# Patient Record
Sex: Female | Born: 1965 | Race: White | Hispanic: No | Marital: Married | Smoking: Never smoker
Health system: Southern US, Community
[De-identification: ages and names within clinical notes are randomized; demographics above are authoritative.]

## PROBLEM LIST (undated history)

## (undated) DIAGNOSIS — E119 Type 2 diabetes mellitus without complications: Secondary | ICD-10-CM

## (undated) HISTORY — DX: Type 2 diabetes mellitus without complications: E11.9

## (undated) HISTORY — PX: APPENDECTOMY: SHX54

---

## 1997-05-07 HISTORY — PX: ABDOMINAL HYSTERECTOMY: SHX81

## 1997-05-07 HISTORY — PX: TOTAL ABDOMINAL HYSTERECTOMY: SHX209

## 2003-05-08 HISTORY — PX: CHOLECYSTECTOMY: SHX55

## 2008-05-07 HISTORY — PX: BILATERAL SALPINGOOPHORECTOMY: SHX1223

## 2008-08-05 LAB — HM COLONOSCOPY: HM COLON: NORMAL

## 2010-04-07 ENCOUNTER — Ambulatory Visit: Payer: Self-pay | Admitting: Internal Medicine

## 2011-03-14 ENCOUNTER — Ambulatory Visit: Payer: Self-pay | Admitting: Internal Medicine

## 2011-10-06 LAB — HM MAMMOGRAPHY: HM Mammogram: NORMAL

## 2012-04-02 DIAGNOSIS — R109 Unspecified abdominal pain: Secondary | ICD-10-CM | POA: Insufficient documentation

## 2014-05-07 HISTORY — PX: COLONOSCOPY: SHX174

## 2014-11-17 ENCOUNTER — Other Ambulatory Visit: Payer: Self-pay

## 2014-11-17 ENCOUNTER — Encounter: Payer: Self-pay | Admitting: Internal Medicine

## 2014-11-17 DIAGNOSIS — G8929 Other chronic pain: Secondary | ICD-10-CM | POA: Insufficient documentation

## 2014-11-17 DIAGNOSIS — R109 Unspecified abdominal pain: Secondary | ICD-10-CM

## 2014-11-17 DIAGNOSIS — N951 Menopausal and female climacteric states: Secondary | ICD-10-CM | POA: Insufficient documentation

## 2014-11-17 DIAGNOSIS — K224 Dyskinesia of esophagus: Secondary | ICD-10-CM | POA: Insufficient documentation

## 2014-11-17 DIAGNOSIS — F411 Generalized anxiety disorder: Secondary | ICD-10-CM | POA: Insufficient documentation

## 2014-11-17 DIAGNOSIS — G43009 Migraine without aura, not intractable, without status migrainosus: Secondary | ICD-10-CM | POA: Insufficient documentation

## 2014-11-17 DIAGNOSIS — F3341 Major depressive disorder, recurrent, in partial remission: Secondary | ICD-10-CM | POA: Insufficient documentation

## 2014-11-17 DIAGNOSIS — A0472 Enterocolitis due to Clostridium difficile, not specified as recurrent: Secondary | ICD-10-CM | POA: Insufficient documentation

## 2014-11-17 DIAGNOSIS — E119 Type 2 diabetes mellitus without complications: Secondary | ICD-10-CM | POA: Insufficient documentation

## 2014-11-17 DIAGNOSIS — J4540 Moderate persistent asthma, uncomplicated: Secondary | ICD-10-CM | POA: Insufficient documentation

## 2014-11-17 DIAGNOSIS — K229 Disease of esophagus, unspecified: Secondary | ICD-10-CM | POA: Insufficient documentation

## 2014-11-17 DIAGNOSIS — Z9889 Other specified postprocedural states: Secondary | ICD-10-CM | POA: Insufficient documentation

## 2014-11-17 HISTORY — DX: Enterocolitis due to Clostridium difficile, not specified as recurrent: A04.72

## 2014-11-17 MED ORDER — EST ESTROGENS-METHYLTEST 0.625-1.25 MG PO TABS: 1.0000 | ORAL_TABLET | Freq: Every day | ORAL | Status: DC | PRN

## 2014-11-29 ENCOUNTER — Ambulatory Visit: Payer: Self-pay | Admitting: Internal Medicine

## 2014-12-06 ENCOUNTER — Other Ambulatory Visit: Payer: Self-pay

## 2014-12-17 ENCOUNTER — Other Ambulatory Visit: Payer: Self-pay

## 2014-12-18 MED ORDER — PROMETHAZINE HCL 25 MG PO TABS: 25.0000 mg | ORAL_TABLET | Freq: Two times a day (BID) | ORAL | Status: DC | PRN

## 2014-12-18 MED ORDER — MONTELUKAST SODIUM 10 MG PO TABS: 10.0000 mg | ORAL_TABLET | Freq: Every day | ORAL | Status: DC

## 2014-12-23 LAB — HM COLONOSCOPY

## 2014-12-31 ENCOUNTER — Telehealth: Payer: Self-pay

## 2014-12-31 NOTE — Telephone Encounter (Signed)
Unfortunately I can not prescribe tramadol as it is a controlled medication.  I suggest she call her GI if she is still having discomfort from her procedure.

## 2014-12-31 NOTE — Telephone Encounter (Signed)
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

## 2014-12-31 NOTE — Telephone Encounter (Signed)
Patient informed.dr 

## 2015-02-15 ENCOUNTER — Other Ambulatory Visit: Payer: Self-pay | Admitting: Internal Medicine

## 2015-02-22 ENCOUNTER — Ambulatory Visit: Payer: Self-pay | Admitting: Internal Medicine

## 2015-02-24 ENCOUNTER — Other Ambulatory Visit: Payer: Self-pay | Admitting: Internal Medicine

## 2015-03-01 ENCOUNTER — Ambulatory Visit: Payer: Self-pay | Admitting: Internal Medicine

## 2015-03-07 ENCOUNTER — Ambulatory Visit: Payer: Self-pay | Admitting: Internal Medicine

## 2015-03-11 ENCOUNTER — Ambulatory Visit (INDEPENDENT_AMBULATORY_CARE_PROVIDER_SITE_OTHER): Payer: BLUE CROSS/BLUE SHIELD | Admitting: Internal Medicine

## 2015-03-11 ENCOUNTER — Encounter: Payer: Self-pay | Admitting: Internal Medicine

## 2015-03-11 ENCOUNTER — Other Ambulatory Visit: Payer: Self-pay | Admitting: Internal Medicine

## 2015-03-11 VITALS — BP 130/90 | HR 64 | Ht 62.0 in | Wt 172.0 lb

## 2015-03-11 DIAGNOSIS — J4540 Moderate persistent asthma, uncomplicated: Secondary | ICD-10-CM | POA: Diagnosis not present

## 2015-03-11 DIAGNOSIS — F3341 Major depressive disorder, recurrent, in partial remission: Secondary | ICD-10-CM

## 2015-03-11 DIAGNOSIS — L989 Disorder of the skin and subcutaneous tissue, unspecified: Secondary | ICD-10-CM | POA: Diagnosis not present

## 2015-03-11 DIAGNOSIS — A047 Enterocolitis due to Clostridium difficile: Secondary | ICD-10-CM | POA: Diagnosis not present

## 2015-03-11 DIAGNOSIS — A0472 Enterocolitis due to Clostridium difficile, not specified as recurrent: Secondary | ICD-10-CM

## 2015-03-11 DIAGNOSIS — G4700 Insomnia, unspecified: Secondary | ICD-10-CM

## 2015-03-11 DIAGNOSIS — E119 Type 2 diabetes mellitus without complications: Secondary | ICD-10-CM

## 2015-03-11 MED ORDER — ZOLPIDEM TARTRATE 5 MG PO TABS: 5.0000 mg | ORAL_TABLET | Freq: Every evening | ORAL | Status: DC | PRN

## 2015-03-11 NOTE — Progress Notes (Signed)
Date:  03/11/2015   Name:  Crystal Rich   DOB:  [birth year]   MRN:  [id number]   Chief Complaint: Diabetes Diabetes She presents for her follow-up diabetic visit. She has type 2 diabetes mellitus. Her disease course has been stable. Pertinent negatives for hypoglycemia include no confusion, headaches or seizures. Pertinent negatives for diabetes include no chest pain and no fatigue. There are no hypoglycemic complications. Symptoms are stable. Current diabetic treatment includes oral agent (monotherapy). She is compliant with treatment most of the time. When asked about meal planning, she reported none. There is no change in her home blood glucose trend. An ACE inhibitor/angiotensin II receptor blocker is not being taken.  Asthma She complains of chest tightness and wheezing. There is no cough or shortness of breath. This is a chronic problem. The current episode started more than 1 year ago. The problem occurs intermittently. The problem has been unchanged. Associated symptoms include trouble swallowing. Pertinent negatives include no chest pain, fever or headaches. Her symptoms are not alleviated by beta-agonist and leukotriene antagonist. Her past medical history is significant for asthma.  Rash This is a new problem. The current episode started in the past 7 days. The problem has been gradually worsening since onset. The affected locations include the left arm. The rash is characterized by redness. Associated symptoms include diarrhea. Pertinent negatives include no cough, fatigue, fever, shortness of breath or vomiting. Past treatments include antibiotic cream. The treatment provided no relief. Her past medical history is significant for asthma.  Insomnia Primary symptoms: sleep disturbance.  The current episode started more than one month. The onset quality is gradual. The problem occurs nightly. The symptoms are aggravated by pain. Past treatments include medication (benedryl and vistaril).  The treatment provided no relief. Prior diagnostic workup includes:  No prior workup.   She thinks that her insomnia is worse and she's been tapering off her pain medication. Recently has only been getting 40 oxycodone.  Her psychiatrist gives her vistaril but it is not helping.  Her depression is well controlled on Lexapro.   Review of Systems  Constitutional: Negative for fever, chills, fatigue and unexpected weight change.  HENT: Positive for trouble swallowing. Negative for hearing loss.   Respiratory: Positive for wheezing. Negative for cough and shortness of breath.   Cardiovascular: Negative for chest pain, palpitations and leg swelling.  Gastrointestinal: Positive for nausea and diarrhea. Negative for vomiting and blood in stool.  Genitourinary: Negative for dysuria.  Musculoskeletal: Negative for back pain and gait problem.  Skin: Positive for rash.  Neurological: Negative for seizures, numbness and headaches.  Psychiatric/Behavioral: Positive for sleep disturbance. Negative for confusion and decreased concentration. The patient has insomnia.     Patient Active Problem List   Diagnosis Date Noted  . Chronic abdominal pain 11/17/2014  . C. difficile colitis 11/17/2014  . Controlled diabetes mellitus type II without complication (HCC) 11/17/2014  . Disorder of esophagus 11/17/2014  . Anxiety, generalized 11/17/2014  . History of biliary T-tube placement 11/17/2014  . 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 Hot flash, menopausal 11/17/2014  . Migraine without aura and responsive to treatment 11/17/2014  . Asthma, moderate persistent 11/17/2014    Prior to Admission medications   Medication Sig Start Date End Date Taking? Authorizing Provider  clonazePAM (KLONOPIN) 2 MG tablet Take 1 tablet by mouth 3 (three) times daily.    Yes Historical Provider, MD  escitalopram (LEXAPRO) 20 MG tablet Take 2 tablets by mouth  daily at 2 PM daily at 2 PM.    Yes  Historical Provider, MD  estrogen-methylTESTOSTERone 0.625-1.25 MG per tablet Take 1 tablet by mouth daily as needed. 11/17/14  Yes Reubin Milan, MD  hydrOXYzine (ATARAX/VISTARIL) 25 MG tablet Take 3 tablets by mouth at bedtime.  02/24/15  Yes Historical Provider, MD  metFORMIN (GLUCOPHAGE) 500 MG tablet Take 2 tablets by mouth 2 (two) times daily with a meal.  04/16/14  Yes Historical Provider, MD  montelukast (SINGULAIR) 10 MG tablet Take 1 tablet (10 mg total) by mouth daily at 2 PM daily at 2 PM. 12/18/14  Yes Reubin Milan, MD  omeprazole (PRILOSEC) 20 MG capsule Take 1 capsule by mouth daily at 2 PM daily at 2 PM. 08/03/14  Yes Historical Provider, MD  promethazine (PHENERGAN) 25 MG tablet TAKE ONE TABLET BY MOUTH TWICE DAILY AS NEEDED 02/15/15  Yes Reubin Milan, MD  PROVENTIL HFA 108 (90 BASE) MCG/ACT inhaler INHALE TWO PUFFS BY MOUTH 4 TIMES DAILY 02/25/15  Yes Reubin Milan, MD  rizatriptan (MAXALT-MLT) 10 MG disintegrating tablet Take 1 tablet by mouth 2 (two) times daily. As needed 04/09/14  Yes Historical Provider, MD  theophylline (UNIPHYL) 400 MG 24 hr tablet Take 1 tablet by mouth daily at 2 PM daily at 2 PM. 06/22/14  Yes Historical Provider, MD    Allergies  Allergen Reactions  . Aspirin   . Macrolides And Ketolides   . Morphine Sulfate Nausea And Vomiting  . Sulfa Antibiotics   . Trazodone Cough  . Bupropion Rash  . Oxybutynin Rash    Past Surgical History  Procedure Laterality Date  . Abdominal hysterectomy  1999  . Appendectomy    . Cholecystectomy  2005  . Bilateral salpingoophorectomy  2010  . Colonoscopy  2016    Social History  Substance Use Topics  . Smoking status: Never Smoker   . Smokeless tobacco: None  . Alcohol Use: No    Medication list has been reviewed and updated.   Physical Exam  Constitutional: She is oriented to person, place, and time. She appears well-developed and well-nourished. No distress.  HENT:  Head: Normocephalic  and atraumatic.  Eyes: Conjunctivae are normal. Right eye exhibits no discharge. Left eye exhibits no discharge. No scleral icterus.  Neck: Carotid bruit is not present.  Cardiovascular: Normal rate, regular rhythm and normal heart sounds.   Pulmonary/Chest: Effort normal and breath sounds normal. No respiratory distress. She has no wheezes.  Musculoskeletal: Normal range of motion.  Lymphadenopathy:    She has no cervical adenopathy.  Neurological: She is alert and oriented to person, place, and time. She has normal reflexes.  Skin: Skin is warm and dry. No rash noted.     Psychiatric: She has a normal mood and affect. Her speech is normal and behavior is normal. Thought content normal.  Nursing note and vitals reviewed.   BP 130/90 mmHg  Pulse 64  Ht  (1.575 m)  Wt 172 lb (78.019 kg)  BMI 31.45 kg/m2  Assessment and Plan: 1. Controlled type 2 diabetes mellitus without complication, without long-term current use of insulin (HCC) Continue metformin - Hemoglobin A1c - Comprehensive metabolic panel - CBC with Differential/Platelet - Microalbumin / creatinine urine ratio  2. Asthma, moderate persistent, uncomplicated Stable symptoms off of steroid inhaler  3. Depression, major, recurrent, in partial remission (HCC) Controlled on Lexapro and Vistaril - TSH  4. Insomnia New problem since weaning off pain medication - zolpidem (AMBIEN)  5 MG tablet; Take 1 tablet (5 mg total) by mouth at bedtime as needed for sleep.  Dispense: 30 tablet; Refill: 1  5. Skin lesion of left arm Suspect spider bite-apply topical cortisone twice a day Call if lesion is enlarging   Bari Edward, MD [facility]  03/11/2015

## 2015-03-12 LAB — COMPREHENSIVE METABOLIC PANEL
ALT: 81 IU/L — ABNORMAL HIGH (ref 0–32)
Alkaline Phosphatase: 101 IU/L (ref 39–117)
BUN / CREAT RATIO: 13 (ref 9–23)
Bilirubin Total: 0.3 mg/dL (ref 0.0–1.2)
CREATININE: 0.55 mg/dL — AB (ref 0.57–1.00)
GFR calc non Af Amer: 112 mL/min/{1.73_m2} (ref >59)
GFR, EST AFRICAN AMERICAN: 129 mL/min/{1.73_m2} (ref >59)

## 2015-03-12 LAB — CBC WITH DIFFERENTIAL/PLATELET
Basophils Absolute: 0 10*3/uL (ref 0.0–0.2)
Basos: 1 %
EOS (ABSOLUTE): 0 10*3/uL (ref 0.0–0.4)
EOS: 0 %
HEMATOCRIT: 37.8 % (ref 34.0–46.6)
HEMOGLOBIN: 12.6 g/dL (ref 11.1–15.9)
Immature Grans (Abs): 0 10*3/uL (ref 0.0–0.1)
Immature Granulocytes: 1 %
LYMPHS ABS: 2.2 10*3/uL (ref 0.7–3.1)
Lymphs: 27 %
MCH: 28.4 pg (ref 26.6–33.0)
MCHC: 33.3 g/dL (ref 31.5–35.7)
MCV: 85 fL (ref 79–97)
MONOCYTES: 8 %
Monocytes Absolute: 0.6 10*3/uL (ref 0.1–0.9)
Neutrophils Absolute: 5.3 10*3/uL (ref 1.4–7.0)
Neutrophils: 63 %
Platelets: 278 10*3/uL (ref 150–379)
RBC: 4.43 x10E6/uL (ref 3.77–5.28)
RDW: 16.4 % — ABNORMAL HIGH (ref 12.3–15.4)
WBC: 8.2 10*3/uL (ref 3.4–10.8)

## 2015-03-12 LAB — HEMOGLOBIN A1C
Est. average glucose Bld gHb Est-mCnc: 171 mg/dL
HEMOGLOBIN A1C: 7.6 % — AB (ref 4.8–5.6)

## 2015-03-12 LAB — MICROALBUMIN / CREATININE URINE RATIO
CREATININE, UR: 26 mg/dL
MICROALB/CREAT RATIO: 100 mg/g creat — ABNORMAL HIGH (ref 0.0–30.0)
MICROALBUM., U, RANDOM: 26 ug/mL

## 2015-03-12 LAB — TSH: TSH: 7.4 u[IU]/mL — ABNORMAL HIGH (ref 0.450–4.500)

## 2015-03-15 LAB — T4: T4, Total: 10.1 ug/dL (ref 4.5–12.0)

## 2015-03-15 LAB — T3: T3, Total: 159 ng/dL (ref 71–180)

## 2015-03-16 ENCOUNTER — Other Ambulatory Visit: Payer: Self-pay | Admitting: Internal Medicine

## 2015-03-16 MED ORDER — EST ESTROGENS-METHYLTEST 0.625-1.25 MG PO TABS: 1.0000 | ORAL_TABLET | Freq: Every day | ORAL | Status: DC | PRN

## 2015-03-17 ENCOUNTER — Telehealth: Payer: Self-pay

## 2015-03-17 NOTE — Telephone Encounter (Signed)
Needs to send Januvia to pharmacy, did not do this. Headache, nausea and vomiting since Sunday, spider bite has scab on it now, less than a dime redness around it. Thinks it is the cause of her sickness, wants antibiotic. dr

## 2015-03-17 NOTE — Telephone Encounter (Signed)
Patient informed.dr 

## 2015-03-17 NOTE — Telephone Encounter (Signed)
The lesion on her arm is not the cause of her complaints.  By description, it is unchanged from her visit. She needs to continue to apply TAO to the lesion and avoid scratching it.  Antibiotics are not indicated. For intestinal symptoms, take phenergan and a clear liquid diet.

## 2015-03-24 ENCOUNTER — Telehealth: Payer: Self-pay

## 2015-03-24 ENCOUNTER — Other Ambulatory Visit: Payer: Self-pay | Admitting: Internal Medicine

## 2015-03-24 MED ORDER — PROMETHAZINE HCL 25 MG RE SUPP: 25.0000 mg | Freq: Four times a day (QID) | RECTAL | Status: DC | PRN

## 2015-03-24 NOTE — Telephone Encounter (Signed)
Patient went to UC told she had sinus infection, sore throat, ear infection, still vomiting used all 12 suppositories needs more.dr

## 2015-03-28 LAB — SPECIMEN STATUS REPORT

## 2015-04-08 ENCOUNTER — Other Ambulatory Visit: Payer: Self-pay | Admitting: Internal Medicine

## 2015-04-19 ENCOUNTER — Other Ambulatory Visit: Payer: Self-pay

## 2015-04-19 MED ORDER — OMEPRAZOLE 20 MG PO CPDR: 20.0000 mg | DELAYED_RELEASE_CAPSULE | Freq: Two times a day (BID) | ORAL | Status: DC

## 2015-04-19 MED ORDER — PROMETHAZINE HCL 25 MG PO TABS
25.0000 mg | ORAL_TABLET | Freq: Two times a day (BID) | ORAL | Status: DC | PRN
Start: 2015-04-19 — End: 2015-05-24

## 2015-04-21 ENCOUNTER — Other Ambulatory Visit: Payer: Self-pay

## 2015-04-21 MED ORDER — MONTELUKAST SODIUM 10 MG PO TABS: 10.0000 mg | ORAL_TABLET | Freq: Every day | ORAL | Status: DC

## 2015-04-21 MED ORDER — RIZATRIPTAN BENZOATE 10 MG PO TBDP: 10.0000 mg | ORAL_TABLET | Freq: Two times a day (BID) | ORAL | Status: DC

## 2015-04-25 ENCOUNTER — Other Ambulatory Visit: Payer: Self-pay

## 2015-04-25 ENCOUNTER — Ambulatory Visit (INDEPENDENT_AMBULATORY_CARE_PROVIDER_SITE_OTHER): Payer: BLUE CROSS/BLUE SHIELD | Admitting: Internal Medicine

## 2015-04-25 ENCOUNTER — Encounter: Payer: Self-pay | Admitting: Internal Medicine

## 2015-04-25 VITALS — BP 140/70 | HR 96 | Ht 62.0 in | Wt 172.6 lb

## 2015-04-25 DIAGNOSIS — E1165 Type 2 diabetes mellitus with hyperglycemia: Secondary | ICD-10-CM

## 2015-04-25 DIAGNOSIS — R590 Localized enlarged lymph nodes: Secondary | ICD-10-CM | POA: Insufficient documentation

## 2015-04-25 DIAGNOSIS — E118 Type 2 diabetes mellitus with unspecified complications: Secondary | ICD-10-CM | POA: Insufficient documentation

## 2015-04-25 DIAGNOSIS — IMO0001 Reserved for inherently not codable concepts without codable children: Secondary | ICD-10-CM

## 2015-04-25 MED ORDER — CEPHALEXIN 500 MG PO CAPS: 500.0000 mg | ORAL_CAPSULE | Freq: Four times a day (QID) | ORAL | Status: DC

## 2015-04-25 MED ORDER — SITAGLIPTIN PHOSPHATE 100 MG PO TABS: 100.0000 mg | ORAL_TABLET | Freq: Every day | ORAL | Status: DC

## 2015-04-25 NOTE — Progress Notes (Signed)
Date:  04/25/2015   Name:  Crystal Rich   DOB:  1966/05/02   MRN:  [id number]   Chief Complaint: Arm Pain  She has swelling and discomfort under her right arm present for the past 3-4 days. There's been no drainage or redness. She's had some headache congestion but no sore throat and ear pain breast problem skin change or rash. After her last visit I recommended Januvia. However the prescription was never sent to the pharmacy.   Review of Systems  Constitutional: Negative for fever, chills and fatigue.  HENT: Positive for postnasal drip and sinus pressure. Negative for ear pain, facial swelling, mouth sores and sore throat.   Respiratory: Negative for cough, chest tightness and shortness of breath.   Cardiovascular: Negative for chest pain.  Musculoskeletal: Positive for back pain. Negative for joint swelling and arthralgias.  Skin: Negative for color change and rash.    Patient Active Problem List   Diagnosis Date Noted  . Insomnia 03/11/2015  . Chronic abdominal pain 11/17/2014  . C. difficile colitis 11/17/2014  . Controlled diabetes mellitus type II without complication (HCC) 11/17/2014  . Disorder of esophagus 11/17/2014  . Anxiety, generalized 11/17/2014  . 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 Hot flash, menopausal 11/17/2014  . Migraine without aura and responsive to treatment 11/17/2014  . Asthma, moderate persistent 11/17/2014  . Abdominal pain 04/02/2012    Prior to Admission medications   Medication Sig Start Date End Date Taking? Authorizing Provider  clonazePAM (KLONOPIN) 1 MG tablet  04/01/15  Yes Historical Provider, MD  escitalopram (LEXAPRO) 20 MG tablet Take 2 tablets by mouth daily at 2 PM daily at 2 PM.    Yes Historical Provider, MD  estrogen-methylTESTOSTERone 0.625-1.25 MG tablet Take 1 tablet by mouth daily as needed. 03/16/15  Yes Reubin Milan, MD  fluticasone Aleda Grana) 50 MCG/ACT nasal spray  03/22/15  Yes  Historical Provider, MD  hydrOXYzine (ATARAX/VISTARIL) 25 MG tablet Take 3 tablets by mouth at bedtime.  02/24/15  Yes Historical Provider, MD  metFORMIN (GLUCOPHAGE) 500 MG tablet Take 2 tablets by mouth 2 (two) times daily with a meal.  04/16/14  Yes Historical Provider, MD  montelukast (SINGULAIR) 10 MG tablet Take 1 tablet (10 mg total) by mouth daily at 2 PM. 04/21/15  Yes Reubin Milan, MD  omeprazole (PRILOSEC) 20 MG capsule Take 1 capsule (20 mg total) by mouth 2 (two) times daily before a meal. 04/19/15  Yes Reubin Milan, MD  promethazine (PHENERGAN) 25 MG suppository Place 1 suppository (25 mg total) rectally every 6 (six) hours as needed for nausea or vomiting. 03/24/15  Yes Reubin Milan, MD  promethazine (PHENERGAN) 25 MG tablet Take 1 tablet (25 mg total) by mouth 2 (two) times daily as needed. 04/19/15  Yes Reubin Milan, MD  PROVENTIL HFA 108 [phone] BASE) MCG/ACT inhaler INHALE 2 PUFFS BY MOUTH 4 TIMES DAILY 04/08/15  Yes Reubin Milan, MD  rizatriptan (MAXALT-MLT) 10 MG disintegrating tablet Take 1 tablet (10 mg total) by mouth 2 (two) times daily. As needed 04/21/15  Yes Reubin Milan, MD  theophylline (UNIPHYL) 400 MG 24 hr tablet Take 1 tablet by mouth daily at 2 PM daily at 2 PM. 06/22/14  Yes Historical Provider, MD  traMADol Janean Sark) 50 MG tablet  03/30/15  Yes Historical Provider, MD  zolpidem (AMBIEN) 5 MG tablet Take 1 tablet (5 mg total) by mouth at bedtime as needed for  sleep. 03/11/15  Yes Reubin MilanLaura H Izora Benn, MD    Allergies  Allergen Reactions  . Nsaids Shortness Of Breath  . Aspirin   . Ketorolac     Other reaction(s): Other (See Comments) Patient states Burning sensation inside Other reaction(s): Other (See Comments) Patient states Burning sensation inside  . Macrolides And Ketolides   . Morphine Sulfate Nausea And Vomiting  . Nalbuphine     Other reaction(s): Other (See Comments) Burning on inside  . Prochlorperazine Edisylate Other (See  Comments)    tachycardia  . Sulfa Antibiotics   . Trazodone Cough  . Bupropion Rash  . Oxybutynin Rash    Past Surgical History  Procedure Laterality Date  . Abdominal hysterectomy  1999  . Appendectomy    . Cholecystectomy  2005  . Bilateral salpingoophorectomy  2010  . Colonoscopy  2016    Social History  Substance Use Topics  . Smoking status: Never Smoker   . Smokeless tobacco: None  . Alcohol Use: No    Medication list has been reviewed and updated.  Physical Exam  Constitutional: She appears well-developed and well-nourished.  Neck: Normal range of motion. No thyromegaly present.  Cardiovascular: Normal rate, regular rhythm and normal heart sounds.   Pulmonary/Chest: Effort normal and breath sounds normal.  Skin:  Axilla normal to visual inspection bilaterally. Tenderness in the right axilla without discrete mass. No breast rash redness or lesions noted. No lesions of the right arm noted.  Nursing note and vitals reviewed.   BP 140/70 mmHg  Pulse 96  Ht 5\' 2"  (1.575 m)  Wt 172 lb 9.6 oz (78.291 kg)  BMI 31.56 kg/m2  Assessment and Plan: 1. Lymphadenopathy, axillary Uncertain cause; wills treat for nonspecific lymphadenitis Follow-up if symptoms do not resolve - cephALEXin (KEFLEX) 500 MG capsule; Take 1 capsule (500 mg total) by mouth 4 (four) times daily.  Dispense: 40 capsule; Refill: 0  2. Uncontrolled type 2 diabetes mellitus without complication, without long-term current use of insulin (HCC) Begin Januvia savings card given  sitaGLIPtin (JANUVIA) 100 MG tablet; Take 1 tablet (100 mg total) by mouth daily.  Dispense: 30 tablet; Refill: 5   Bari EdwardLaura Lareta Bruneau, MD [facility]  04/25/2015

## 2015-04-29 ENCOUNTER — Other Ambulatory Visit: Payer: Self-pay | Admitting: Internal Medicine

## 2015-05-24 ENCOUNTER — Other Ambulatory Visit: Payer: Self-pay

## 2015-05-24 DIAGNOSIS — G4700 Insomnia, unspecified: Secondary | ICD-10-CM

## 2015-05-24 MED ORDER — ALBUTEROL SULFATE HFA 108 (90 BASE) MCG/ACT IN AERS: 2.0000 | INHALATION_SPRAY | RESPIRATORY_TRACT | Status: DC | PRN

## 2015-05-24 MED ORDER — ZOLPIDEM TARTRATE 5 MG PO TABS
5.0000 mg | ORAL_TABLET | Freq: Every evening | ORAL | Status: DC | PRN
Start: 2015-05-24 — End: 2015-07-15

## 2015-05-24 MED ORDER — PROMETHAZINE HCL 25 MG PO TABS: 25.0000 mg | ORAL_TABLET | Freq: Two times a day (BID) | ORAL | Status: DC | PRN

## 2015-05-24 NOTE — Telephone Encounter (Signed)
Patient called in requesting refills of medications. States that she will be using [facility] in [place] because her insurance does not contract with [facility] anymore.

## 2015-07-14 ENCOUNTER — Other Ambulatory Visit: Payer: Self-pay | Admitting: Internal Medicine

## 2015-07-15 ENCOUNTER — Encounter: Payer: Self-pay | Admitting: Internal Medicine

## 2015-07-15 ENCOUNTER — Ambulatory Visit (INDEPENDENT_AMBULATORY_CARE_PROVIDER_SITE_OTHER): Payer: BLUE CROSS/BLUE SHIELD | Admitting: Internal Medicine

## 2015-07-15 VITALS — BP 122/78 | HR 113 | Temp 98.5°F | Ht 62.0 in | Wt 164.0 lb

## 2015-07-15 DIAGNOSIS — G4700 Insomnia, unspecified: Secondary | ICD-10-CM

## 2015-07-15 DIAGNOSIS — J0101 Acute recurrent maxillary sinusitis: Secondary | ICD-10-CM | POA: Diagnosis not present

## 2015-07-15 DIAGNOSIS — J029 Acute pharyngitis, unspecified: Secondary | ICD-10-CM

## 2015-07-15 LAB — POCT RAPID STREP A (OFFICE): RAPID STREP A SCREEN: NEGATIVE

## 2015-07-15 MED ORDER — ZOLPIDEM TARTRATE 5 MG PO TABS: 5.0000 mg | ORAL_TABLET | Freq: Every evening | ORAL | Status: DC | PRN

## 2015-07-15 MED ORDER — AMOXICILLIN 875 MG PO TABS: 875.0000 mg | ORAL_TABLET | Freq: Two times a day (BID) | ORAL | Status: DC

## 2015-07-15 NOTE — Progress Notes (Signed)
Date:  07/15/2015   Name:  Crystal CableCarolyn D Rhinehart   DOB:  1966/02/20   MRN:  [id number]   Chief Complaint: Sore Throat Sore Throat  Associated symptoms include congestion, coughing, ear pain and headaches. Pertinent negatives include no abdominal pain or vomiting.  Sinusitis This is a new problem. The current episode started in the past 7 days. The problem is unchanged. There has been no fever. Associated symptoms include congestion, coughing, diaphoresis, ear pain, headaches, sinus pressure and a sore throat. Pertinent negatives include no chills. Past treatments include oral decongestants, saline sprays and spray decongestants.  Insomnia Primary symptoms: fragmented sleep.  The problem has been rapidly improving (since adding [place]) since onset. Past treatments include medication. The treatment provided significant relief.    Review of Systems  Constitutional: Positive for diaphoresis. Negative for fever and chills.  HENT: Positive for congestion, ear pain, sinus pressure and sore throat.   Respiratory: Positive for cough. Negative for chest tightness and wheezing.   Cardiovascular: Negative for chest pain, palpitations and leg swelling.  Gastrointestinal: Negative for nausea, vomiting and abdominal pain.  Neurological: Positive for headaches. Negative for dizziness, tremors and syncope.  Psychiatric/Behavioral: The patient has insomnia.     Patient Active Problem List   Diagnosis Date Noted  . Lymphadenopathy, axillary 04/25/2015  . Uncontrolled type 2 diabetes mellitus without complication, without long-term current use of insulin (HCC) 04/25/2015  . Insomnia 03/11/2015  . Chronic abdominal pain 11/17/2014  . C. difficile colitis 11/17/2014  . Disorder of esophagus 11/17/2014  . Anxiety, generalized 11/17/2014  . 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 Hot flash, menopausal 11/17/2014  . Migraine without aura and responsive to treatment  11/17/2014  . Asthma, moderate persistent 11/17/2014  . Abdominal pain 04/02/2012    Prior to Admission medications   Medication Sig Start Date End Date Taking? Authorizing Provider  albuterol (PROVENTIL HFA) 108 (90 Base) MCG/ACT inhaler Inhale 2 puffs into the lungs every 4 (four) hours as needed for wheezing or shortness of breath. 05/24/15  Yes Reubin MilanLaura H Lj Miyamoto, MD  clonazePAM Scarlette Calico(KLONOPIN) 1 MG tablet  04/01/15  Yes Historical Provider, MD  escitalopram (LEXAPRO) 20 MG tablet Take 2 tablets by mouth daily at 2 PM daily at 2 PM.    Yes Historical Provider, MD  estrogen-methylTESTOSTERone 0.625-1.25 MG tablet Take 1 tablet by mouth daily as needed. 03/16/15  Yes Reubin MilanLaura H Yovanny Coats, MD  fluticasone Aleda Grana(FLONASE) 50 MCG/ACT nasal spray  03/22/15  Yes Historical Provider, MD  hydrOXYzine (ATARAX/VISTARIL) 25 MG tablet Take 3 tablets by mouth at bedtime.  02/24/15  Yes Historical Provider, MD  metFORMIN (GLUCOPHAGE) 500 MG tablet TAKE TWO TABLETS BY MOUTH IN THE MORNING AND ONE IN THE EVENING 04/29/15  Yes Reubin MilanLaura H Ayeshia Coppin, MD  montelukast (SINGULAIR) 10 MG tablet Take 1 tablet (10 mg total) by mouth daily at 2 PM. 04/21/15  Yes Reubin MilanLaura H Kenneshia Rehm, MD  omeprazole (PRILOSEC) 20 MG capsule Take 1 capsule (20 mg total) by mouth 2 (two) times daily before a meal. 04/19/15  Yes Reubin MilanLaura H Alexandar Weisenberger, MD  promethazine (PHENERGAN) 25 MG tablet Take 1 tablet (25 mg total) by mouth 2 (two) times daily as needed. 05/24/15  Yes Reubin MilanLaura H Deven Furia, MD  rizatriptan (MAXALT-MLT) 10 MG disintegrating tablet Take 1 tablet (10 mg total) by mouth 2 (two) times daily. As needed 04/21/15  Yes Reubin MilanLaura H Amisha Pospisil, MD  theophylline (UNIPHYL) 400 MG 24 hr tablet TAKE 1 TABLET BY MOUTH ONCE  A DAY 07/14/15  Yes Reubin Milan, MD  traMADol Janean Sark) 50 MG tablet  03/30/15  Yes Historical Provider, MD  zolpidem (AMBIEN) 5 MG tablet Take 1 tablet (5 mg total) by mouth at bedtime as needed for sleep. Patient not taking: Reported on 07/15/2015  05/24/15   Reubin Milan, MD    Allergies  Allergen Reactions  . Nsaids Shortness Of Breath  . Aspirin   . Ketorolac     Other reaction(s): Other (See Comments) Patient states Burning sensation inside Other reaction(s): Other (See Comments) Patient states Burning sensation inside  . Macrolides And Ketolides   . Morphine Sulfate Nausea And Vomiting  . Nalbuphine     Other reaction(s): Other (See Comments) Burning on inside  . Prochlorperazine Edisylate Other (See Comments)    tachycardia  . Sulfa Antibiotics   . Trazodone Cough  . Bupropion Rash  . Oxybutynin Rash    Past Surgical History  Procedure Laterality Date  . Abdominal hysterectomy  1999  . Appendectomy    . Cholecystectomy  2005  . Bilateral salpingoophorectomy  2010  . Colonoscopy  2016    Social History  Substance Use Topics  . Smoking status: Never Smoker   . Smokeless tobacco: None  . Alcohol Use: No     Medication list has been reviewed and updated.   Physical Exam  Constitutional: She is oriented to person, place, and time. She appears well-developed and well-nourished.  HENT:  Right Ear: External ear and ear canal normal. Tympanic membrane is not erythematous and not retracted.  Left Ear: External ear and ear canal normal. Tympanic membrane is not erythematous and not retracted.  Nose: Right sinus exhibits maxillary sinus tenderness and frontal sinus tenderness. Left sinus exhibits maxillary sinus tenderness and frontal sinus tenderness.  Mouth/Throat: Uvula is midline and mucous membranes are normal. No oral lesions. Posterior oropharyngeal erythema present. No oropharyngeal exudate.  Cardiovascular: Normal rate, regular rhythm and normal heart sounds.   Pulmonary/Chest: Breath sounds normal. She has no wheezes. She has no rales.  Lymphadenopathy:    She has no cervical adenopathy.  Neurological: She is alert and oriented to person, place, and time.    BP 122/78 mmHg  Pulse 113   Temp(Src) 98.5 F (36.9 C) (Oral)  Ht  (1.575 m)  Wt 164 lb (74.39 kg)  BMI 29.99 kg/m2  SpO2 96%  Assessment and Plan: 1. Acute recurrent maxillary sinusitis Continue flonase and sudafed - amoxicillin (AMOXIL) 875 MG tablet; Take 1 tablet (875 mg total) by mouth 2 (two) times daily.  Dispense: 20 tablet; Refill: 0  2. Sore throat Strep negative - POCT rapid strep A  3. Insomnia Did well on trial of ambien - will refill - zolpidem (AMBIEN) 5 MG tablet; Take 1 tablet (5 mg total) by mouth at bedtime as needed for sleep.  Dispense: 30 tablet; Refill: 5   Bari Edward, MD [facility]  07/15/2015

## 2015-07-15 NOTE — Patient Instructions (Signed)

## 2015-07-25 ENCOUNTER — Ambulatory Visit: Payer: Self-pay | Admitting: Internal Medicine

## 2015-08-01 ENCOUNTER — Ambulatory Visit: Payer: Self-pay | Admitting: Internal Medicine

## 2015-08-08 ENCOUNTER — Other Ambulatory Visit: Payer: Self-pay

## 2015-08-08 MED ORDER — RIZATRIPTAN BENZOATE 10 MG PO TBDP: 10.0000 mg | ORAL_TABLET | Freq: Two times a day (BID) | ORAL | Status: DC

## 2015-08-08 NOTE — Telephone Encounter (Signed)
Patient called in today requesting refill of medication be sent in to the pharmacy.

## 2015-08-14 ENCOUNTER — Other Ambulatory Visit: Payer: Self-pay | Admitting: Internal Medicine

## 2015-08-16 ENCOUNTER — Other Ambulatory Visit: Payer: Self-pay | Admitting: Internal Medicine

## 2015-08-16 MED ORDER — OMEPRAZOLE 20 MG PO CPDR: 20.0000 mg | DELAYED_RELEASE_CAPSULE | Freq: Two times a day (BID) | ORAL | Status: DC

## 2015-08-22 NOTE — Telephone Encounter (Signed)
pts coming in on 08/24/15.

## 2015-08-24 ENCOUNTER — Encounter: Payer: Self-pay | Admitting: Internal Medicine

## 2015-08-24 ENCOUNTER — Ambulatory Visit (INDEPENDENT_AMBULATORY_CARE_PROVIDER_SITE_OTHER): Payer: BLUE CROSS/BLUE SHIELD | Admitting: Internal Medicine

## 2015-08-24 VITALS — BP 138/84 | HR 98 | Temp 98.3°F | Ht 62.0 in | Wt 166.8 lb

## 2015-08-24 DIAGNOSIS — G2581 Restless legs syndrome: Secondary | ICD-10-CM

## 2015-08-24 DIAGNOSIS — E1165 Type 2 diabetes mellitus with hyperglycemia: Secondary | ICD-10-CM | POA: Diagnosis not present

## 2015-08-24 DIAGNOSIS — IMO0001 Reserved for inherently not codable concepts without codable children: Secondary | ICD-10-CM

## 2015-08-24 MED ORDER — ROPINIROLE HCL 0.5 MG PO TABS: 0.5000 mg | ORAL_TABLET | Freq: Every day | ORAL | Status: DC

## 2015-08-24 NOTE — Progress Notes (Signed)
Date:  08/24/2015   Name:  Crystal Rich   DOB:  Apr 23, 1966   MRN:  [id number]   Chief Complaint: Follow-up; Diabetes; and Leg Pain Diabetes She presents for her follow-up diabetic visit. She has type 2 diabetes mellitus. Her disease course has been stable. Pertinent negatives for hypoglycemia include no headaches or tremors. Pertinent negatives for diabetes include no chest pain, no fatigue, no polydipsia, no polyuria and no weakness. Symptoms are stable. Current diabetic treatment includes oral agent (monotherapy). Her weight is stable. She monitors urine at home 1-2 x per month. Her breakfast blood glucose is taken between 5-6 am. Her breakfast blood glucose range is generally 110-130 mg/dl.   Restless leg - about 3 weeks ago began to have leg restlessness and sensation of tingling and cramping starting in the evening.  She has tried walking without benefit.  It does not bother her during the day.  It is not worse with change in daily activity.  Lab Results  Component Value Date   HGBA1C 7.6* 03/11/2015    Review of Systems  Constitutional: Negative for fever, chills and fatigue.  Respiratory: Negative for chest tightness and shortness of breath.   Cardiovascular: Negative for chest pain, palpitations and leg swelling.  Endocrine: Negative for polydipsia and polyuria.  Musculoskeletal: Positive for myalgias (restless leg symptoms).  Neurological: Negative for tremors, weakness, numbness and headaches.    Patient Active Problem List   Diagnosis Date Noted  . Lymphadenopathy, axillary 04/25/2015  . Uncontrolled type 2 diabetes mellitus without complication, without long-term current use of insulin (HCC) 04/25/2015  . Insomnia 03/11/2015  . Chronic abdominal pain 11/17/2014  . C. difficile colitis 11/17/2014  . Disorder of esophagus 11/17/2014  . Anxiety, generalized 11/17/2014  . 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 Hot flash, menopausal  11/17/2014  . Migraine without aura and responsive to treatment 11/17/2014  . Asthma, moderate persistent 11/17/2014  . Abdominal pain 04/02/2012    Prior to Admission medications   Medication Sig Start Date End Date Taking? Authorizing Provider  albuterol (PROVENTIL HFA) 108 (90 Base) MCG/ACT inhaler Inhale 2 puffs into the lungs every 4 (four) hours as needed for wheezing or shortness of breath. 05/24/15   Reubin Milan, MD  amoxicillin (AMOXIL) 875 MG tablet Take 1 tablet (875 mg total) by mouth 2 (two) times daily. 07/15/15   Reubin Milan, MD  clonazePAM Scarlette Calico) 1 MG tablet  04/01/15   Historical Provider, MD  escitalopram (LEXAPRO) 20 MG tablet Take 2 tablets by mouth daily at 2 PM daily at 2 PM.     Historical Provider, MD  estrogen-methylTESTOSTERone 0.625-1.25 MG tablet Take 1 tablet by mouth daily as needed. 03/16/15   Reubin Milan, MD  fluticasone Aleda Grana) 50 MCG/ACT nasal spray  03/22/15   Historical Provider, MD  hydrOXYzine (ATARAX/VISTARIL) 25 MG tablet Take 3 tablets by mouth at bedtime.  02/24/15   Historical Provider, MD  metFORMIN (GLUCOPHAGE) 500 MG tablet TAKE TWO TABLETS BY MOUTH IN THE MORNING AND ONE IN THE EVENING 04/29/15   Reubin Milan, MD  montelukast (SINGULAIR) 10 MG tablet TAKE 1 TABLET BY MOUTH ONCE A DAY --AT 2PM 08/14/15   Reubin Milan, MD  omeprazole (PRILOSEC) 20 MG capsule Take 1 capsule (20 mg total) by mouth 2 (two) times daily before a meal. 08/16/15   Reubin Milan, MD  promethazine (PHENERGAN) 25 MG tablet Take 1 tablet (25 mg total) by mouth 2 (  two) times daily as needed. 05/24/15   Reubin MilanLaura H Dhyan Noah, MD  rizatriptan (MAXALT-MLT) 10 MG disintegrating tablet Take 1 tablet (10 mg total) by mouth 2 (two) times daily. As needed 08/08/15   Reubin MilanLaura H Azaliah Carrero, MD  theophylline (UNIPHYL) 400 MG 24 hr tablet TAKE 1 TABLET BY MOUTH ONCE A DAY 07/14/15   Reubin MilanLaura H Demarquez Ciolek, MD  traMADol Janean Sark(ULTRAM) 50 MG tablet  03/30/15   Historical Provider, MD  zolpidem  (AMBIEN) 5 MG tablet Take 1 tablet (5 mg total) by mouth at bedtime as needed for sleep. 07/15/15   Reubin MilanLaura H Kyson Kupper, MD    Allergies  Allergen Reactions  . Nsaids Shortness Of Breath  . Aspirin   . Ketorolac     Other reaction(s): Other (See Comments) Patient states Burning sensation inside Other reaction(s): Other (See Comments) Patient states Burning sensation inside  . Macrolides And Ketolides   . Morphine Sulfate Nausea And Vomiting  . Nalbuphine     Other reaction(s): Other (See Comments) Burning on inside  . Prochlorperazine Edisylate Other (See Comments)    tachycardia  . Sulfa Antibiotics   . Trazodone Cough  . Bupropion Rash  . Oxybutynin Rash    Past Surgical History  Procedure Laterality Date  . Abdominal hysterectomy  1999  . Appendectomy    . Cholecystectomy  2005  . Bilateral salpingoophorectomy  2010  . Colonoscopy  2016    Social History  Substance Use Topics  . Smoking status: Never Smoker   . Smokeless tobacco: None  . Alcohol Use: No     Medication list has been reviewed and updated.   Physical Exam  Constitutional: She is oriented to person, place, and time. She appears well-developed. No distress.  HENT:  Head: Normocephalic and atraumatic.  Neck: Normal range of motion. No thyromegaly present.  Cardiovascular: Normal rate, regular rhythm and normal heart sounds.   Pulmonary/Chest: Effort normal and breath sounds normal. No respiratory distress. She has no wheezes.  Musculoskeletal: Normal range of motion.  Lymphadenopathy:    She has no cervical adenopathy.  Neurological: She is alert and oriented to person, place, and time. She has normal strength and normal reflexes. No cranial nerve deficit or sensory deficit. She displays a negative Romberg sign.  Normal foot exam - intact pulses, skin, nails and sensation  Skin: Skin is warm and dry. No rash noted.  Psychiatric: She has a normal mood and affect. Her behavior is normal. Thought  content normal.    BP 138/84 mmHg  Pulse 98  Temp(Src) 98.3 F (36.8 C) (Oral)  Ht 5\' 2"  (1.575 m)  Wt 166 lb 12.8 oz (75.66 kg)  BMI 30.50 kg/m2  SpO2 99%  Assessment and Plan: 1. Uncontrolled type 2 diabetes mellitus without complication, without long-term current use of insulin (HCC) Continue metformin; continue regular accu-cheks - Hemoglobin A1c  2. Restless leg syndrome Recent labs showed normal CBC and renal functions Start with 0.5 mg  May increase after 2 weeks to 1 mg if needed - rOPINIRole (REQUIP) 0.5 MG tablet; Take 1 tablet (0.5 mg total) by mouth at bedtime.  Dispense: 30 tablet; Refill: 0   Bari EdwardLaura Zareya Tuckett, MD [facility]  08/24/2015

## 2015-08-25 LAB — HEMOGLOBIN A1C
Est. average glucose Bld gHb Est-mCnc: 157 mg/dL
HEMOGLOBIN A1C: 7.1 % — AB (ref 4.8–5.6)

## 2015-08-26 ENCOUNTER — Telehealth: Payer: Self-pay

## 2015-08-26 NOTE — Telephone Encounter (Signed)
Spoke with patient. Patient advised of all results and verbalized understanding. Will call back with any future questions or concerns. MA[facility]  

## 2015-08-26 NOTE — Telephone Encounter (Signed)
-----   Message from Reubin MilanLaura H Berglund, MD sent at 08/25/2015  7:45 AM EDT ----- DM is better!  Continue same medication.

## 2015-09-03 ENCOUNTER — Other Ambulatory Visit: Payer: Self-pay | Admitting: Internal Medicine

## 2015-09-16 ENCOUNTER — Telehealth: Payer: Self-pay

## 2015-09-16 ENCOUNTER — Other Ambulatory Visit: Payer: Self-pay | Admitting: Internal Medicine

## 2015-09-16 MED ORDER — MONTELUKAST SODIUM 10 MG PO TABS
10.0000 mg | ORAL_TABLET | Freq: Every day | ORAL | Status: DC
Start: 2015-09-16 — End: 2015-10-12

## 2015-09-16 MED ORDER — THEOPHYLLINE ER 400 MG PO TB24: 400.0000 mg | ORAL_TABLET | Freq: Every day | ORAL | Status: DC

## 2015-09-16 NOTE — Telephone Encounter (Signed)
Patient requesting refill on Singulair and Theopline. [facility] [place]

## 2015-09-19 ENCOUNTER — Other Ambulatory Visit: Payer: Self-pay | Admitting: Internal Medicine

## 2015-09-19 DIAGNOSIS — G2581 Restless legs syndrome: Secondary | ICD-10-CM

## 2015-09-19 MED ORDER — ROPINIROLE HCL 0.5 MG PO TABS: 0.5000 mg | ORAL_TABLET | Freq: Every day | ORAL | Status: DC

## 2015-10-12 ENCOUNTER — Telehealth: Payer: Self-pay

## 2015-10-12 ENCOUNTER — Other Ambulatory Visit: Payer: Self-pay | Admitting: Internal Medicine

## 2015-10-12 DIAGNOSIS — G2581 Restless legs syndrome: Secondary | ICD-10-CM

## 2015-10-12 MED ORDER — MONTELUKAST SODIUM 10 MG PO TABS
10.0000 mg | ORAL_TABLET | Freq: Every day | ORAL | Status: DC
Start: 2015-10-12 — End: 2016-10-21

## 2015-10-12 MED ORDER — ROPINIROLE HCL 1 MG PO TABS: 1.0000 mg | ORAL_TABLET | Freq: Every day | ORAL | Status: DC

## 2015-10-12 NOTE — Telephone Encounter (Signed)
VM: Taking 0.5mg  and not working. Started using 2 tabs instead and works better but ran out. Needs refill. Also wants singulair refill.

## 2015-10-13 ENCOUNTER — Telehealth: Payer: Self-pay

## 2015-10-13 NOTE — Telephone Encounter (Signed)
Crystal JonesCarolyn called again to have refills sent I advised sent on 10/12/2015.

## 2015-10-21 ENCOUNTER — Telehealth: Payer: Self-pay

## 2015-10-21 NOTE — Telephone Encounter (Signed)
Refill Singulair

## 2015-10-21 NOTE — Telephone Encounter (Signed)
LMTCB

## 2015-10-21 NOTE — Telephone Encounter (Signed)
Done on 10/12/15.  What is going on with her pharmacy.  I feel like this the second or third time she has called when an Rx has already been done.

## 2015-10-24 NOTE — Telephone Encounter (Signed)
Left another message will await call back.

## 2015-11-09 ENCOUNTER — Other Ambulatory Visit: Payer: Self-pay | Admitting: Internal Medicine

## 2015-11-09 MED ORDER — METFORMIN HCL 500 MG PO TABS: 1000.0000 mg | ORAL_TABLET | Freq: Two times a day (BID) | ORAL | Status: DC

## 2015-11-16 ENCOUNTER — Other Ambulatory Visit: Payer: Self-pay | Admitting: Internal Medicine

## 2015-12-26 ENCOUNTER — Other Ambulatory Visit: Payer: Self-pay | Admitting: Internal Medicine

## 2015-12-26 DIAGNOSIS — G2581 Restless legs syndrome: Secondary | ICD-10-CM

## 2015-12-26 MED ORDER — ROPINIROLE HCL 2 MG PO TABS: 2.0000 mg | ORAL_TABLET | Freq: Every day | ORAL | 0 refills | Status: DC

## 2016-01-07 ENCOUNTER — Other Ambulatory Visit: Payer: Self-pay | Admitting: Internal Medicine

## 2016-01-12 ENCOUNTER — Ambulatory Visit: Payer: Self-pay | Admitting: Internal Medicine

## 2016-01-17 ENCOUNTER — Ambulatory Visit: Payer: Self-pay | Admitting: Internal Medicine

## 2016-01-23 ENCOUNTER — Ambulatory Visit: Payer: Self-pay | Admitting: Internal Medicine

## 2016-01-23 ENCOUNTER — Other Ambulatory Visit: Payer: Self-pay | Admitting: Internal Medicine

## 2016-01-23 DIAGNOSIS — G2581 Restless legs syndrome: Secondary | ICD-10-CM

## 2016-01-25 ENCOUNTER — Ambulatory Visit: Payer: Self-pay | Admitting: Internal Medicine

## 2016-02-01 ENCOUNTER — Encounter: Payer: Self-pay | Admitting: Internal Medicine

## 2016-02-01 ENCOUNTER — Ambulatory Visit (INDEPENDENT_AMBULATORY_CARE_PROVIDER_SITE_OTHER): Payer: BLUE CROSS/BLUE SHIELD | Admitting: Internal Medicine

## 2016-02-01 ENCOUNTER — Other Ambulatory Visit: Payer: Self-pay | Admitting: Internal Medicine

## 2016-02-01 VITALS — BP 124/84 | HR 103 | Resp 16 | Ht 62.0 in | Wt 162.4 lb

## 2016-02-01 DIAGNOSIS — G4700 Insomnia, unspecified: Secondary | ICD-10-CM | POA: Diagnosis not present

## 2016-02-01 DIAGNOSIS — S76219A Strain of adductor muscle, fascia and tendon of unspecified thigh, initial encounter: Secondary | ICD-10-CM

## 2016-02-01 DIAGNOSIS — E1165 Type 2 diabetes mellitus with hyperglycemia: Secondary | ICD-10-CM | POA: Diagnosis not present

## 2016-02-01 DIAGNOSIS — J4540 Moderate persistent asthma, uncomplicated: Secondary | ICD-10-CM

## 2016-02-01 DIAGNOSIS — IMO0001 Reserved for inherently not codable concepts without codable children: Secondary | ICD-10-CM

## 2016-02-01 NOTE — Patient Instructions (Signed)
Schedule mammogram.

## 2016-02-01 NOTE — Progress Notes (Signed)
Date:  02/01/2016   Name:  Crystal Rich   DOB:  1965/09/17   MRN:  [id number]   Chief Complaint: Diabetes Diabetes  She presents for her follow-up diabetic visit. She has type 2 diabetes mellitus. Her disease course has been stable. Pertinent negatives for hypoglycemia include no nervousness/anxiousness. Pertinent negatives for diabetes include no chest pain and no fatigue. Current diabetic treatment includes oral agent (monotherapy). She is compliant with treatment most of the time. An ACE inhibitor/angiotensin II receptor blocker is not being taken.  Asthma  There is no cough or shortness of breath. This is a chronic problem. The problem occurs intermittently. The problem has been waxing and waning. Associated symptoms include myalgias (tender left groin). Pertinent negatives include no chest pain or fever. Her symptoms are alleviated by beta-agonist. Her past medical history is significant for asthma.   Groin swelling - present for about a month.  Slightly tender but not changing in size.  No known injury.  No vaginal discharge or lesions.  No skin changes.  Lab Results  Component Value Date   HGBA1C 7.1 (H) 08/24/2015     Review of Systems  Constitutional: Negative for chills, fatigue and fever.  Respiratory: Negative for cough, chest tightness and shortness of breath.   Cardiovascular: Negative for chest pain and leg swelling.  Gastrointestinal: Negative for abdominal pain.  Genitourinary: Negative for dysuria, genital sores and vaginal discharge.  Musculoskeletal: Positive for myalgias (tender left groin).  Psychiatric/Behavioral: Negative for dysphoric mood and sleep disturbance. The patient is not nervous/anxious.     Patient Active Problem List   Diagnosis Date Noted  . Lymphadenopathy, axillary 04/25/2015  . Uncontrolled type 2 diabetes mellitus without complication, without long-term current use of insulin (HCC) 04/25/2015  . Insomnia 03/11/2015  . Chronic  abdominal pain 11/17/2014  . C. difficile colitis 11/17/2014  . Disorder of esophagus 11/17/2014  . Anxiety, generalized 11/17/2014  . 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 Hot flash, menopausal 11/17/2014  . Migraine without aura and responsive to treatment 11/17/2014  . Asthma, moderate persistent 11/17/2014  . Abdominal pain 04/02/2012    Prior to Admission medications   Medication Sig Start Date End Date Taking? Authorizing Provider  albuterol (PROVENTIL HFA) 108 (90 Base) MCG/ACT inhaler Inhale 2 puffs into the lungs every 4 (four) hours as needed for wheezing or shortness of breath. 05/24/15  Yes Reubin Milan, MD  clonazePAM Scarlette Calico) 1 MG tablet  04/01/15  Yes Historical Provider, MD  escitalopram (LEXAPRO) 20 MG tablet Take 2 tablets by mouth daily at 2 PM daily at 2 PM.    Yes Historical Provider, MD  EST ESTROGENS-METHYLTEST HS 0.625-1.25 MG tablet TAKE 1 TABLET BY MOUTH ONCE A DAY AS NEEDED 09/19/15  Yes Reubin Milan, MD  fluticasone Aleda Grana) 50 MCG/ACT nasal spray  03/22/15  Yes Historical Provider, MD  hydrOXYzine (ATARAX/VISTARIL) 25 MG tablet Take 3 tablets by mouth at bedtime.  02/24/15  Yes Historical Provider, MD  metFORMIN (GLUCOPHAGE) 500 MG tablet TAKE 2 TABLETS BY MOUTH TWICE A DAY --- WITH A MEAL. 01/07/16  Yes Reubin Milan, MD  montelukast (SINGULAIR) 10 MG tablet Take 1 tablet (10 mg total) by mouth at bedtime. 10/12/15  Yes Reubin Milan, MD  omeprazole (PRILOSEC) 20 MG capsule Take 1 capsule (20 mg total) by mouth 2 (two) times daily before a meal. 08/16/15  Yes Reubin Milan, MD  promethazine (PHENERGAN) 25 MG tablet TAKE 1  TABLET BY MOUTH TWICE A DAY AS NEEDED 11/16/15  Yes Reubin MilanLaura H Kenae Lindquist, MD  rizatriptan (MAXALT-MLT) 10 MG disintegrating tablet Take 1 tablet (10 mg total) by mouth 2 (two) times daily. As needed 08/08/15  Yes Reubin MilanLaura H Dink Creps, MD  rOPINIRole (REQUIP) 2 MG tablet TAKE 1 TABLET BY MOUTH AT BEDTIME 01/23/16   Yes Reubin MilanLaura H Xyon Lukasik, MD  theophylline (UNIPHYL) 400 MG 24 hr tablet Take 1 tablet (400 mg total) by mouth daily. 09/16/15  Yes Reubin MilanLaura H Aleina Burgio, MD  traMADol Janean Sark(ULTRAM) 50 MG tablet  03/30/15  Yes Historical Provider, MD  zolpidem (AMBIEN) 5 MG tablet Take 1 tablet (5 mg total) by mouth at bedtime as needed for sleep. 07/15/15  Yes Reubin MilanLaura H Francy Mcilvaine, MD    Allergies  Allergen Reactions  . Nsaids Shortness Of Breath  . Aspirin   . Ketorolac     Other reaction(s): Other (See Comments) Patient states Burning sensation inside Other reaction(s): Other (See Comments) Patient states Burning sensation inside  . Macrolides And Ketolides   . Morphine Sulfate Nausea And Vomiting  . Nalbuphine     Other reaction(s): Other (See Comments) Burning on inside  . Prochlorperazine Edisylate Other (See Comments)    tachycardia  . Sulfa Antibiotics   . Trazodone Cough  . Bupropion Rash  . Oxybutynin Rash    Past Surgical History:  Procedure Laterality Date  . ABDOMINAL HYSTERECTOMY  1999  . APPENDECTOMY    . BILATERAL SALPINGOOPHORECTOMY  2010  . CHOLECYSTECTOMY  2005  . COLONOSCOPY  2016    Social History  Substance Use Topics  . Smoking status: Never Smoker  . Smokeless tobacco: Never Used  . Alcohol use No     Medication list has been reviewed and updated.   Physical Exam  Constitutional: She is oriented to person, place, and time. She appears well-developed and well-nourished. No distress.  HENT:  Head: Normocephalic and atraumatic.  Cardiovascular: Normal rate, regular rhythm and normal heart sounds.   Pulmonary/Chest: Effort normal and breath sounds normal. No respiratory distress. She has no wheezes.  Abdominal: Soft. Bowel sounds are normal.  Musculoskeletal: Normal range of motion.  Tender in left groin - no adenopathy but prominent ligament palpated  Neurological: She is alert and oriented to person, place, and time.  Skin: Skin is warm and dry. No rash noted.    Psychiatric: She has a normal mood and affect. Her speech is normal and behavior is normal. Thought content normal.  Nursing note and vitals reviewed.   BP 124/84   Pulse (!) 103   Resp 16   Ht 5\' 2"  (1.575 m)   Wt 162 lb 6.4 oz (73.7 kg)   SpO2 100%   BMI 29.70 kg/m   Assessment and Plan: 1. Uncontrolled type 2 diabetes mellitus without complication, without long-term current use of insulin (HCC) Continue medications - Hemoglobin A1c - Comprehensive metabolic panel  2. Asthma, moderate persistent, uncomplicated stable  3. Insomnia Doing well with control of RLS  4. Groin strain, initial encounter Recommend heat and advil as needed Avoid painful movements   Bari EdwardLaura Mela Perham, MD [facility]  02/01/2016

## 2016-02-02 LAB — COMPREHENSIVE METABOLIC PANEL
A/G RATIO: 1.6 (ref 1.2–2.2)
ALK PHOS: 92 IU/L (ref 39–117)
ALT: 32 IU/L (ref 0–32)
AST: 33 IU/L (ref 0–40)
Albumin: 4.7 g/dL (ref 3.5–5.5)
BUN/Creatinine Ratio: 13 (ref 9–23)
BUN: 9 mg/dL (ref 6–24)
Bilirubin Total: 0.3 mg/dL (ref 0.0–1.2)
CO2: 26 mmol/L (ref 18–29)
CREATININE: 0.68 mg/dL (ref 0.57–1.00)
Calcium: 9.5 mg/dL (ref 8.7–10.2)
Chloride: 96 mmol/L (ref 96–106)
GFR calc Af Amer: 120 mL/min/{1.73_m2} (ref >59)
GFR calc non Af Amer: 104 mL/min/{1.73_m2} (ref >59)
GLOBULIN, TOTAL: 2.9 g/dL (ref 1.5–4.5)
Glucose: 84 mg/dL (ref 65–99)
POTASSIUM: 4.4 mmol/L (ref 3.5–5.2)
SODIUM: 139 mmol/L (ref 134–144)
Total Protein: 7.6 g/dL (ref 6.0–8.5)

## 2016-02-02 LAB — HEMOGLOBIN A1C
Est. average glucose Bld gHb Est-mCnc: 146 mg/dL
HEMOGLOBIN A1C: 6.7 % — AB (ref 4.8–5.6)

## 2016-03-01 ENCOUNTER — Ambulatory Visit: Payer: Self-pay | Admitting: Internal Medicine

## 2016-03-03 ENCOUNTER — Other Ambulatory Visit: Payer: Self-pay | Admitting: Internal Medicine

## 2016-03-09 ENCOUNTER — Ambulatory Visit (INDEPENDENT_AMBULATORY_CARE_PROVIDER_SITE_OTHER): Payer: BLUE CROSS/BLUE SHIELD | Admitting: Internal Medicine

## 2016-03-09 ENCOUNTER — Encounter: Payer: Self-pay | Admitting: Internal Medicine

## 2016-03-09 VITALS — BP 124/80 | HR 86 | Temp 98.9°F | Resp 16 | Ht 62.0 in | Wt 162.0 lb

## 2016-03-09 DIAGNOSIS — J0100 Acute maxillary sinusitis, unspecified: Secondary | ICD-10-CM

## 2016-03-09 MED ORDER — GUAIFENESIN-CODEINE 100-10 MG/5ML PO SYRP: 5.0000 mL | ORAL_SOLUTION | Freq: Three times a day (TID) | ORAL | 0 refills | Status: DC | PRN

## 2016-03-09 MED ORDER — AMOXICILLIN-POT CLAVULANATE 875-125 MG PO TABS: 1.0000 | ORAL_TABLET | Freq: Two times a day (BID) | ORAL | 0 refills | Status: DC

## 2016-03-09 MED ORDER — METHYLPREDNISOLONE 4 MG PO TBPK: ORAL_TABLET | ORAL | 0 refills | Status: DC

## 2016-03-09 NOTE — Progress Notes (Signed)
Date:  03/09/2016   Name:  Crystal Rich   DOB:  [birth year]   MRN:  [id number]   Chief Complaint: Nasal Congestion (Cough head and ST x 1 week ) Cough  Associated symptoms include a sore throat and shortness of breath. Pertinent negatives include no chest pain, chills, fever or wheezing.  Sinusitis  Associated symptoms include congestion, coughing, shortness of breath, sinus pressure and a sore throat. Pertinent negatives include no chills.      Review of Systems  Constitutional: Positive for fatigue. Negative for chills and fever.  HENT: Positive for congestion, sinus pressure and sore throat.   Eyes: Negative for visual disturbance.  Respiratory: Positive for cough, chest tightness and shortness of breath. Negative for wheezing.   Cardiovascular: Negative for chest pain and palpitations.  Gastrointestinal: Negative for abdominal pain, diarrhea and vomiting.    Patient Active Problem List   Diagnosis Date Noted  . Lymphadenopathy, axillary 04/25/2015  . Uncontrolled type 2 diabetes mellitus without complication, without long-term current use of insulin (HCC) 04/25/2015  . Insomnia 03/11/2015  . Chronic abdominal pain 11/17/2014  . C. difficile colitis 11/17/2014  . Disorder of esophagus 11/17/2014  . Anxiety, generalized 11/17/2014  . 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 Hot flash, menopausal 11/17/2014  . Migraine without aura and responsive to treatment 11/17/2014  . Asthma, moderate persistent 11/17/2014  . Abdominal pain 04/02/2012    Prior to Admission medications   Medication Sig Start Date End Date Taking? Authorizing Provider  albuterol (PROVENTIL HFA) 108 (90 Base) MCG/ACT inhaler Inhale 2 puffs into the lungs every 4 (four) hours as needed for wheezing or shortness of breath. 05/24/15  Yes Reubin MilanLaura H Keelan Tripodi, MD  clonazePAM Scarlette Calico(KLONOPIN) 1 MG tablet  04/01/15  Yes Historical Provider, MD  escitalopram (LEXAPRO) 20 MG tablet  Take 2 tablets by mouth daily at 2 PM daily at 2 PM.    Yes Historical Provider, MD  EST ESTROGENS-METHYLTEST HS 0.625-1.25 MG tablet TAKE 1 TABLET BY MOUTH ONCE A DAY AS NEEDED 09/19/15  Yes Reubin MilanLaura H Aleczander Fandino, MD  fluticasone Aleda Grana(FLONASE) 50 MCG/ACT nasal spray  03/22/15  Yes Historical Provider, MD  hydrOXYzine (ATARAX/VISTARIL) 25 MG tablet Take 3 tablets by mouth at bedtime.  02/24/15  Yes Historical Provider, MD  metFORMIN (GLUCOPHAGE) 500 MG tablet TAKE 2 TABLETS BY MOUTH IN THE MORNING ADN 1 TABLET IN THE EVENING 03/03/16  Yes Reubin MilanLaura H Amita Atayde, MD  montelukast (SINGULAIR) 10 MG tablet Take 1 tablet (10 mg total) by mouth at bedtime. 10/12/15  Yes Reubin MilanLaura H Genoveva Singleton, MD  omeprazole (PRILOSEC) 20 MG capsule Take 1 capsule (20 mg total) by mouth 2 (two) times daily before a meal. 08/16/15  Yes Reubin MilanLaura H Satchel Heidinger, MD  promethazine (PHENERGAN) 25 MG tablet TAKE 1 TABLET BY MOUTH TWICE A DAY AS NEEDED 11/16/15  Yes Reubin MilanLaura H Bard Haupert, MD  rizatriptan (MAXALT-MLT) 10 MG disintegrating tablet Take 1 tablet (10 mg total) by mouth 2 (two) times daily. As needed 08/08/15  Yes Reubin MilanLaura H Ladarion Munyon, MD  rOPINIRole (REQUIP) 2 MG tablet TAKE 1 TABLET BY MOUTH AT BEDTIME 01/23/16  Yes Reubin MilanLaura H Amani Marseille, MD  theophylline (UNIPHYL) 400 MG 24 hr tablet Take 1 tablet (400 mg total) by mouth daily. 09/16/15  Yes Reubin MilanLaura H Annetta Deiss, MD  traMADol Janean Sark(ULTRAM) 50 MG tablet  03/30/15  Yes Historical Provider, MD    Allergies  Allergen Reactions  . Nsaids Shortness Of Breath  . Aspirin   .  Ketorolac     Other reaction(s): Other (See Comments) Patient states Burning sensation inside Other reaction(s): Other (See Comments) Patient states Burning sensation inside  . Macrolides And Ketolides   . Morphine Sulfate Nausea And Vomiting  . Nalbuphine     Other reaction(s): Other (See Comments) Burning on inside  . Prochlorperazine Edisylate Other (See Comments)    tachycardia  . Sulfa Antibiotics   . Trazodone Cough  . Bupropion Rash  .  Oxybutynin Rash    Past Surgical History:  Procedure Laterality Date  . ABDOMINAL HYSTERECTOMY  1999  . APPENDECTOMY    . BILATERAL SALPINGOOPHORECTOMY  2010  . CHOLECYSTECTOMY  2005  . COLONOSCOPY  2016    Social History  Substance Use Topics  . Smoking status: Never Smoker  . Smokeless tobacco: Never Used  . Alcohol use No     Medication list has been reviewed and updated.   Physical Exam  Constitutional: She is oriented to person, place, and time. She appears well-developed and well-nourished.  HENT:  Right Ear: External ear and ear canal normal. Tympanic membrane is not erythematous and not retracted.  Left Ear: External ear and ear canal normal. Tympanic membrane is not erythematous and not retracted.  Nose: Right sinus exhibits maxillary sinus tenderness and frontal sinus tenderness. Left sinus exhibits maxillary sinus tenderness and frontal sinus tenderness.  Mouth/Throat: Uvula is midline and mucous membranes are normal. No oral lesions. Posterior oropharyngeal erythema present. No oropharyngeal exudate.  Neck: Normal range of motion. No thyromegaly present.  Cardiovascular: Normal rate, regular rhythm and normal heart sounds.   Pulmonary/Chest: Effort normal. She has decreased breath sounds. She has no wheezes. She has no rales.  Lymphadenopathy:    She has no cervical adenopathy.  Neurological: She is alert and oriented to person, place, and time.    BP 124/80   Pulse 86   Temp 98.9 F (37.2 C) (Oral)   Resp 16   Ht 5\' 2"  (1.575 m)   Wt 162 lb (73.5 kg)   SpO2 95%   BMI 29.63 kg/m   Assessment and Plan: 1. Acute non-recurrent maxillary sinusitis Increase fluids; continue flonase and claritin - amoxicillin-clavulanate (AUGMENTIN) 875-125 MG tablet; Take 1 tablet by mouth 2 (two) times daily.  Dispense: 20 tablet; Refill: 0 - guaiFENesin-codeine (ROBITUSSIN AC) 100-10 MG/5ML syrup; Take 5 mLs by mouth 3 (three) times daily as needed for cough.  Dispense:  150 mL; Refill: 0 - methylPREDNISolone (MEDROL DOSEPAK) 4 MG TBPK tablet; Take 6 pills on day 1 the 5 pills day 2 then 4 pills day 3 then 3 pills day 4 then 2 pills day 5 then one pills day 6 then stop  Dispense: 21 tablet; Refill: 0   Bari EdwardLaura Dmonte Maher, MD [facility]  03/09/2016

## 2016-03-09 NOTE — Patient Instructions (Signed)

## 2016-03-22 ENCOUNTER — Other Ambulatory Visit: Payer: Self-pay | Admitting: Internal Medicine

## 2016-03-22 NOTE — Telephone Encounter (Signed)
Wants refills on Estrogen.

## 2016-03-31 ENCOUNTER — Other Ambulatory Visit: Payer: Self-pay | Admitting: Internal Medicine

## 2016-03-31 DIAGNOSIS — G2581 Restless legs syndrome: Secondary | ICD-10-CM

## 2016-04-27 ENCOUNTER — Other Ambulatory Visit: Payer: Self-pay | Admitting: Internal Medicine

## 2016-04-27 ENCOUNTER — Telehealth: Payer: Self-pay | Admitting: Internal Medicine

## 2016-04-27 MED ORDER — PROMETHAZINE HCL 25 MG PO TABS: 25.0000 mg | ORAL_TABLET | Freq: Two times a day (BID) | ORAL | 5 refills | Status: DC | PRN

## 2016-04-27 NOTE — Telephone Encounter (Signed)
Pt called need refill Rx. Promethazine.Marland Kitchen..Marland Kitchen

## 2016-05-09 ENCOUNTER — Other Ambulatory Visit: Payer: Self-pay | Admitting: Internal Medicine

## 2016-05-11 ENCOUNTER — Telehealth: Payer: Self-pay | Admitting: Internal Medicine

## 2016-05-11 NOTE — Telephone Encounter (Signed)
Pt called need refill on Rx.Rizatriptan (Maxalt) 10 mg [facility] .Marland Kitchen..Marland Kitchen

## 2016-05-11 NOTE — Telephone Encounter (Signed)
Done on the 05/09/16.

## 2016-05-11 NOTE — Telephone Encounter (Signed)
Called pt about medication been sent on 05-10-2015 to [facility] [place]

## 2016-05-23 ENCOUNTER — Other Ambulatory Visit: Payer: Self-pay | Admitting: Internal Medicine

## 2016-06-04 ENCOUNTER — Encounter: Payer: Self-pay | Admitting: Internal Medicine

## 2016-06-04 ENCOUNTER — Ambulatory Visit (INDEPENDENT_AMBULATORY_CARE_PROVIDER_SITE_OTHER): Payer: BLUE CROSS/BLUE SHIELD | Admitting: Internal Medicine

## 2016-06-04 VITALS — BP 142/92 | HR 96 | Temp 98.0°F | Ht 61.0 in | Wt 170.0 lb

## 2016-06-04 DIAGNOSIS — R112 Nausea with vomiting, unspecified: Secondary | ICD-10-CM | POA: Diagnosis not present

## 2016-06-04 DIAGNOSIS — J0100 Acute maxillary sinusitis, unspecified: Secondary | ICD-10-CM | POA: Diagnosis not present

## 2016-06-04 MED ORDER — PROMETHAZINE HCL 25 MG RE SUPP: 25.0000 mg | Freq: Four times a day (QID) | RECTAL | 0 refills | Status: DC | PRN

## 2016-06-04 MED ORDER — AMOXICILLIN 875 MG PO TABS: 875.0000 mg | ORAL_TABLET | Freq: Two times a day (BID) | ORAL | 0 refills | Status: DC

## 2016-06-04 NOTE — Progress Notes (Signed)
Date:  06/04/2016   Name:  Crystal Rich   DOB:  [birth year]   MRN:  [id number]   Chief Complaint: Sore Throat (pt stated sore throat, chest pain, sinus pressure, vomiting for 2 weeks) Sore Throat   This is a new problem. The current episode started 1 to 4 weeks ago. The problem has been unchanged. Neither side of throat is experiencing more pain than the other. Associated symptoms include congestion, coughing and vomiting. Pertinent negatives include no abdominal pain, diarrhea or shortness of breath.  Emesis   This is a new problem. The current episode started 1 to 4 weeks ago. The problem occurs 2 to 4 times per day. The problem has been waxing and waning. There has been no fever. Associated symptoms include chest pain, chills, coughing and sweats. Pertinent negatives include no abdominal pain, diarrhea, fever or weight loss. Treatments tried: unable to keep down phenergan.  Initially had fever, dry cough and myalgia about 2 weeks ago.  Treated it like the flu and improved.  Then a week ago developed sore throat, recurrent vomiting and low grade fever.   Review of Systems  Constitutional: Positive for chills. Negative for fatigue, fever and weight loss.  HENT: Positive for congestion, postnasal drip and sore throat.   Eyes: Negative for visual disturbance.  Respiratory: Positive for cough. Negative for chest tightness, shortness of breath and wheezing.   Cardiovascular: Positive for chest pain. Negative for leg swelling.  Gastrointestinal: Positive for nausea and vomiting. Negative for abdominal pain and diarrhea.    Patient Active Problem List   Diagnosis Date Noted  . Lymphadenopathy, axillary 04/25/2015  . Uncontrolled type 2 diabetes mellitus without complication, without long-term current use of insulin (HCC) 04/25/2015  . Insomnia 03/11/2015  . Chronic abdominal pain 11/17/2014  . C. difficile colitis 11/17/2014  . Disorder of esophagus 11/17/2014  . Anxiety, generalized  11/17/2014  . 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 Hot flash, menopausal 11/17/2014  . Migraine without aura and responsive to treatment 11/17/2014  . Asthma, moderate persistent 11/17/2014  . Abdominal pain 04/02/2012    Prior to Admission medications   Medication Sig Start Date End Date Taking? Authorizing Provider  albuterol (PROVENTIL HFA) 108 (90 Base) MCG/ACT inhaler Inhale 2 puffs into the lungs every 4 (four) hours as needed for wheezing or shortness of breath. 05/24/15  Yes Reubin Milan, MD  clonazePAM Scarlette Calico) 1 MG tablet  04/01/15  Yes Historical Provider, MD  escitalopram (LEXAPRO) 20 MG tablet Take 2 tablets by mouth daily at 2 PM daily at 2 PM.    Yes Historical Provider, MD  EST ESTROGENS-METHYLTEST HS 0.625-1.25 MG tablet TAKE 1 TABLET BY MOUTH EVERY DAY AS NEEDED 03/22/16  Yes Reubin Milan, MD  fluticasone Aleda Grana) 50 MCG/ACT nasal spray  03/22/15  Yes Historical Provider, MD  hydrOXYzine (ATARAX/VISTARIL) 25 MG tablet Take 3 tablets by mouth at bedtime.  02/24/15  Yes Historical Provider, MD  metFORMIN (GLUCOPHAGE) 500 MG tablet TAKE 2 TABLETS BY MOUTH IN THE MORNING ADN 1 TABLET IN THE EVENING 05/23/16  Yes Reubin Milan, MD  montelukast (SINGULAIR) 10 MG tablet Take 1 tablet (10 mg total) by mouth at bedtime. 10/12/15  Yes Reubin Milan, MD  omeprazole (PRILOSEC) 20 MG capsule TAKE 1 CAPSULE BY MOUTH TWICE A DAY --BEFORE MEALS 03/31/16  Yes Reubin Milan, MD  promethazine (PHENERGAN) 25 MG tablet Take 1 tablet (25 mg total) by mouth 2 (two)  times daily as needed. 04/27/16  Yes Reubin MilanLaura H Sayvion Vigen, MD  rizatriptan (MAXALT-MLT) 10 MG disintegrating tablet DISSOLVE 1 TABLET ON TONGUE TWICE A DAY AS NEEDED 05/09/16  Yes Reubin MilanLaura H Keiry Kowal, MD  rOPINIRole (REQUIP) 2 MG tablet TAKE 1 TABLET BY MOUTH AT BEDTIME 01/23/16  Yes Reubin MilanLaura H Caydance Kuehnle, MD  theophylline (UNIPHYL) 400 MG 24 hr tablet Take 1 tablet (400 mg total) by mouth daily. 09/16/15   Yes Reubin MilanLaura H Antonietta Lansdowne, MD  traMADol Janean Sark(ULTRAM) 50 MG tablet  03/30/15  Yes Historical Provider, MD  ARIPiprazole (ABILIFY) 2 MG tablet Take 2 mg by mouth daily. 04/10/16   Historical Provider, MD    Allergies  Allergen Reactions  . Nsaids Shortness Of Breath  . Aspirin   . Ketorolac     Other reaction(s): Other (See Comments) Patient states Burning sensation inside Other reaction(s): Other (See Comments) Patient states Burning sensation inside  . Macrolides And Ketolides   . Morphine Sulfate Nausea And Vomiting  . Nalbuphine     Other reaction(s): Other (See Comments) Burning on inside  . Prochlorperazine Edisylate Other (See Comments)    tachycardia  . Sulfa Antibiotics   . Trazodone Cough  . Bupropion Rash  . Oxybutynin Rash    Past Surgical History:  Procedure Laterality Date  . ABDOMINAL HYSTERECTOMY  1999  . APPENDECTOMY    . BILATERAL SALPINGOOPHORECTOMY  2010  . CHOLECYSTECTOMY  2005  . COLONOSCOPY  2016    Social History  Substance Use Topics  . Smoking status: Never Smoker  . Smokeless tobacco: Never Used  . Alcohol use No     Medication list has been reviewed and updated.   Physical Exam  Constitutional: She is oriented to person, place, and time. She appears well-developed and well-nourished.  HENT:  Right Ear: External ear and ear canal normal. Tympanic membrane is not erythematous and not retracted.  Left Ear: External ear and ear canal normal. Tympanic membrane is not erythematous and not retracted.  Nose: Right sinus exhibits maxillary sinus tenderness and frontal sinus tenderness. Left sinus exhibits maxillary sinus tenderness and frontal sinus tenderness.  Mouth/Throat: Uvula is midline and mucous membranes are normal. No oral lesions. Posterior oropharyngeal erythema present. No oropharyngeal exudate.  Neck: Normal range of motion. Neck supple.  Cardiovascular: Normal rate, regular rhythm and normal heart sounds.   Pulmonary/Chest: Breath sounds  normal. She has no wheezes. She has no rales.  Abdominal: Soft. Bowel sounds are normal. She exhibits no mass. There is tenderness. There is no guarding.  Lymphadenopathy:    She has no cervical adenopathy.  Neurological: She is alert and oriented to person, place, and time.    BP (!) 142/92   Pulse 96   Temp 98 F (36.7 C)   Ht 5\' 1"  (1.549 m)   Wt 170 lb (77.1 kg)   SpO2 98%   BMI 32.12 kg/m   Assessment and Plan: 1. Acute non-recurrent maxillary sinusitis - amoxicillin (AMOXIL) 875 MG tablet; Take 1 tablet (875 mg total) by mouth 2 (two) times daily.  Dispense: 20 tablet; Refill: 0  2. Non-intractable vomiting with nausea, unspecified vomiting type Clear liquids - advance as tolerated - promethazine (PHENERGAN) 25 MG suppository; Place 1 suppository (25 mg total) rectally every 6 (six) hours as needed for nausea or vomiting.  Dispense: 24 each; Refill: 0   Bari EdwardLaura Granville Whitefield, MD [facility]  06/04/2016

## 2016-06-08 ENCOUNTER — Other Ambulatory Visit: Payer: Self-pay | Admitting: Internal Medicine

## 2016-06-11 ENCOUNTER — Encounter: Payer: BLUE CROSS/BLUE SHIELD | Admitting: Internal Medicine

## 2016-07-27 ENCOUNTER — Other Ambulatory Visit: Payer: Self-pay | Admitting: Internal Medicine

## 2016-08-07 ENCOUNTER — Other Ambulatory Visit: Payer: Self-pay

## 2016-09-06 ENCOUNTER — Other Ambulatory Visit: Payer: Self-pay | Admitting: Internal Medicine

## 2016-09-06 NOTE — Telephone Encounter (Signed)
Called patient about med pick up, patient states that she will call back to make appt..Marland Kitchen

## 2016-09-13 ENCOUNTER — Telehealth: Payer: Self-pay

## 2016-09-13 DIAGNOSIS — G2581 Restless legs syndrome: Secondary | ICD-10-CM

## 2016-09-13 NOTE — Telephone Encounter (Signed)
Pt called stating she needs refill on ropinirole and one other medications which I could not understand on VM. Tried calling pt back but "this number is not in service". Awaiting pt call back to find out what pharmacy she uses and which 2 medications is needing refills.

## 2016-09-14 ENCOUNTER — Other Ambulatory Visit: Payer: Self-pay | Admitting: Internal Medicine

## 2016-09-14 DIAGNOSIS — G2581 Restless legs syndrome: Secondary | ICD-10-CM

## 2016-09-17 ENCOUNTER — Other Ambulatory Visit: Payer: Self-pay

## 2016-09-17 ENCOUNTER — Other Ambulatory Visit: Payer: Self-pay | Admitting: Internal Medicine

## 2016-09-17 DIAGNOSIS — G2581 Restless legs syndrome: Secondary | ICD-10-CM

## 2016-09-17 MED ORDER — EST ESTROGENS-METHYLTEST 0.625-1.25 MG PO TABS: ORAL_TABLET | ORAL | 0 refills | Status: DC

## 2016-09-21 ENCOUNTER — Other Ambulatory Visit: Payer: Self-pay | Admitting: Internal Medicine

## 2016-09-29 ENCOUNTER — Other Ambulatory Visit: Payer: Self-pay | Admitting: Internal Medicine

## 2016-10-09 ENCOUNTER — Other Ambulatory Visit: Payer: Self-pay | Admitting: Internal Medicine

## 2016-10-21 ENCOUNTER — Other Ambulatory Visit: Payer: Self-pay | Admitting: Internal Medicine

## 2016-10-29 ENCOUNTER — Other Ambulatory Visit: Payer: Self-pay | Admitting: Internal Medicine

## 2016-11-04 ENCOUNTER — Other Ambulatory Visit: Payer: Self-pay | Admitting: Internal Medicine

## 2016-11-07 ENCOUNTER — Other Ambulatory Visit: Payer: Self-pay | Admitting: Internal Medicine

## 2016-11-08 NOTE — Telephone Encounter (Signed)
Called patient on home phone but could not reach her, no other number was available for contact.

## 2016-12-07 LAB — HM DIABETES EYE EXAM

## 2016-12-14 ENCOUNTER — Other Ambulatory Visit: Payer: Self-pay | Admitting: Internal Medicine

## 2016-12-14 NOTE — Telephone Encounter (Signed)
Pt will call back Monday to set up appt.

## 2016-12-24 ENCOUNTER — Ambulatory Visit: Payer: Self-pay | Admitting: Internal Medicine

## 2016-12-26 ENCOUNTER — Ambulatory Visit: Payer: Self-pay | Admitting: Internal Medicine

## 2016-12-31 ENCOUNTER — Encounter: Payer: Self-pay | Admitting: Internal Medicine

## 2016-12-31 ENCOUNTER — Ambulatory Visit (INDEPENDENT_AMBULATORY_CARE_PROVIDER_SITE_OTHER): Payer: BLUE CROSS/BLUE SHIELD | Admitting: Internal Medicine

## 2016-12-31 VITALS — BP 144/92 | HR 94 | Ht 61.0 in | Wt 168.4 lb

## 2016-12-31 DIAGNOSIS — G2581 Restless legs syndrome: Secondary | ICD-10-CM | POA: Diagnosis not present

## 2016-12-31 DIAGNOSIS — F3341 Major depressive disorder, recurrent, in partial remission: Secondary | ICD-10-CM | POA: Diagnosis not present

## 2016-12-31 DIAGNOSIS — E1165 Type 2 diabetes mellitus with hyperglycemia: Secondary | ICD-10-CM | POA: Diagnosis not present

## 2016-12-31 DIAGNOSIS — R1319 Other dysphagia: Secondary | ICD-10-CM

## 2016-12-31 DIAGNOSIS — J4540 Moderate persistent asthma, uncomplicated: Secondary | ICD-10-CM

## 2016-12-31 DIAGNOSIS — R1310 Dysphagia, unspecified: Secondary | ICD-10-CM

## 2016-12-31 DIAGNOSIS — I10 Essential (primary) hypertension: Secondary | ICD-10-CM | POA: Diagnosis not present

## 2016-12-31 DIAGNOSIS — IMO0001 Reserved for inherently not codable concepts without codable children: Secondary | ICD-10-CM

## 2016-12-31 HISTORY — DX: Dysphagia, unspecified: R13.10

## 2016-12-31 MED ORDER — OMEPRAZOLE 20 MG PO CPDR: 20.0000 mg | DELAYED_RELEASE_CAPSULE | Freq: Two times a day (BID) | ORAL | 5 refills | Status: DC

## 2016-12-31 MED ORDER — ROPINIROLE HCL 1 MG PO TABS: 3.0000 mg | ORAL_TABLET | Freq: Every day | ORAL | 5 refills | Status: DC

## 2016-12-31 MED ORDER — LOSARTAN POTASSIUM 50 MG PO TABS: 50.0000 mg | ORAL_TABLET | Freq: Every day | ORAL | 5 refills | Status: DC

## 2016-12-31 NOTE — Patient Instructions (Addendum)
Resume Omeprazole 20 mg twice a day.  Can also take phazyme as needed  Take Ropinirole after supper (2-3 hours before bedtime)  Start Losartan 50 mg once a day for blood pressure and also protect kidneys from diabetes

## 2016-12-31 NOTE — Progress Notes (Signed)
Date:  12/31/2016   Name:  Crystal Rich   DOB:  Jan 16, 1966   MRN:  [id number]   Chief Complaint: Diabetes (Need A1C- See's Psych. Started on new meds. Been off for week and half. Was raising sugar and feeling funny so stopped meds. Was on meds for 3 weeks. Unknown of name. ); Gastroesophageal Reflux (Prilosec is no longer helping- wants to discuss. ); and restless legs (Wants to go up on Ropinirole medications. Legs still want to move while trying to go to bed. )  Diabetes  She presents for her follow-up diabetic visit. She has type 2 diabetes mellitus. Her disease course has been worsening. Pertinent negatives for hypoglycemia include no dizziness or headaches. Pertinent negatives for diabetes include no chest pain and no fatigue. Symptoms are stable. She is compliant with treatment most of the time. She monitors blood glucose at home 1-2 x per day. Her breakfast blood glucose is taken between 6-7 am. Her breakfast blood glucose range is generally 140-180 mg/dl.  Gastroesophageal Reflux  She complains of abdominal pain, dysphagia, heartburn, a sore throat and water brash. She reports no chest pain or no wheezing. This is a recurrent problem. The problem occurs frequently. The heartburn is located in the substernum. The symptoms are aggravated by lying down and certain foods. Pertinent negatives include no fatigue. She has tried a PPI and a histamine-2 antagonist for the symptoms. The treatment provided mild relief.  Restless legs - restless leg symptoms seem to getting slightly worse. She is unsure if this because her clonazepam is also been reduced. She takes 3 mg of ropinirole at bedtime. Her leg symptoms exhibits an earlier than that however. Depression - gained some weight after taking mirtazapine so she stopped it, esp when her sugars increased.  Review of Systems  Constitutional: Negative for chills, fatigue, fever and unexpected weight change.  HENT: Positive for sore throat and  trouble swallowing.   Respiratory: Negative for chest tightness, shortness of breath and wheezing.   Cardiovascular: Negative for chest pain, palpitations and leg swelling.  Gastrointestinal: Positive for abdominal pain, dysphagia and heartburn.       Gerd and dysphagia  Skin: Negative for color change and rash.  Neurological: Negative for dizziness and headaches.  Psychiatric/Behavioral: Positive for sleep disturbance.    Patient Active Problem List   Diagnosis Date Noted  . Dysphagia 12/31/2016  . Restless legs syndrome 12/31/2016  . Lymphadenopathy, axillary 04/25/2015  . Uncontrolled type 2 diabetes mellitus without complication, without long-term current use of insulin (HCC) 04/25/2015  . Insomnia 03/11/2015  . Chronic abdominal pain 11/17/2014  . C. difficile colitis 11/17/2014  . Disorder of esophagus 11/17/2014  . Anxiety, generalized 11/17/2014  . 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 Hot flash, menopausal 11/17/2014  . Migraine without aura and responsive to treatment 11/17/2014  . Asthma, moderate persistent 11/17/2014  . Abdominal pain 04/02/2012    Prior to Admission medications   Medication Sig Start Date End Date Taking? Authorizing Provider  clonazePAM (KLONOPIN) 1 MG tablet  04/01/15  Yes [provider]  escitalopram (LEXAPRO) 20 MG tablet Take 2 tablets by mouth daily at 2 PM daily at 2 PM.    Yes [provider]  EST ESTROGENS-METHYLTEST HS 0.625-1.25 MG tablet TAKE 1 TABLET BY MOUTH EVERY DAY AS NEEDED 10/09/16  Yes Reubin Milan, MD  fluticasone Aleda Grana) 50 MCG/ACT nasal spray  03/22/15  Yes [provider]  hydrOXYzine (ATARAX/VISTARIL) 25 MG  tablet Take 3 tablets by mouth at bedtime.  02/24/15  Yes [provider]  metFORMIN (GLUCOPHAGE) 500 MG tablet TAKE 2 TABLETS BY MOUTH IN THE MORNING AND 1 TABLET IN THE EVENING 12/14/16  Yes Reubin Milan, MD  montelukast (SINGULAIR) 10 MG  tablet TAKE 1 TABLET (10 MG TOTAL) BY MOUTH AT BEDTIME. 10/21/16  Yes Reubin Milan, MD  omeprazole (PRILOSEC) 20 MG capsule TAKE 1 CAPSULE BY MOUTH TWICE A DAY --BEFORE MEALS 03/31/16  Yes Reubin Milan, MD  promethazine (PHENERGAN) 25 MG suppository Place 1 suppository (25 mg total) rectally every 6 (six) hours as needed for nausea or vomiting. 06/04/16  Yes Reubin Milan, MD  PROVENTIL HFA 108 [id number] Base) MCG/ACT inhaler INHALE 2 PUFFS BY MOUTH EVERY 4 HOURS AS NEEDED FOR SHORTNESS OF BREATH 06/09/16  Yes Reubin Milan, MD  rizatriptan (MAXALT-MLT) 10 MG disintegrating tablet DISSOLVE 1 TABLET ON TONGUE TWICE A DAY AS NEEDED 12/14/16  Yes Reubin Milan, MD  rOPINIRole (REQUIP) 1 MG tablet TAKE 1 TABLET BY MOUTH AT BEDTIME 09/17/16  Yes Reubin Milan, MD  theophylline (UNIPHYL) 400 MG 24 hr tablet TAKE 1 TABLET (400 MG TOTAL) BY MOUTH DAILY. 09/21/16  Yes Reubin Milan, MD  traMADol Janean Sark) 50 MG tablet  03/30/15  Yes [provider]  ARIPiprazole (ABILIFY) 2 MG tablet Take 2 mg by mouth daily. 04/10/16   [provider]    Allergies  Allergen Reactions  . Nsaids Shortness Of Breath  . Aspirin   . Ketorolac     Other reaction(s): Other (See Comments) Patient states Burning sensation inside Other reaction(s): Other (See Comments) Patient states Burning sensation inside  . Macrolides And Ketolides   . Morphine Sulfate Nausea And Vomiting  . Nalbuphine     Other reaction(s): Other (See Comments) Burning on inside  . Prochlorperazine Edisylate Other (See Comments)    tachycardia  . Sulfa Antibiotics   . Trazodone Cough  . Bupropion Rash  . Oxybutynin Rash    Past Surgical History:  Procedure Laterality Date  . ABDOMINAL HYSTERECTOMY  1999  . APPENDECTOMY    . BILATERAL SALPINGOOPHORECTOMY  2010  . CHOLECYSTECTOMY  2005  . COLONOSCOPY  2016    Social History  Substance Use Topics  . Smoking status: Never Smoker  . Smokeless tobacco: Never  Used  . Alcohol use No     Medication list has been reviewed and updated.  PHQ 2/9 Scores 12/31/2016  PHQ - 2 Score 1  PHQ- 9 Score 2    Physical Exam  Constitutional: She is oriented to person, place, and time. She appears well-developed. No distress.  HENT:  Head: Normocephalic and atraumatic.  Neck: Normal range of motion. Neck supple. No thyromegaly present.  Cardiovascular: Normal rate, regular rhythm and normal heart sounds.   Pulmonary/Chest: Effort normal and breath sounds normal. No respiratory distress. She has no wheezes.  Abdominal: Soft. Bowel sounds are normal. She exhibits no mass. There is tenderness. There is no guarding.  Musculoskeletal: Normal range of motion. She exhibits no edema or tenderness.  Neurological: She is alert and oriented to person, place, and time.  Skin: Skin is warm and dry. No rash noted.  Psychiatric: She has a normal mood and affect. Her behavior is normal. Thought content normal.  Nursing note and vitals reviewed.   BP (!) 144/92   Pulse 94   Ht 5\' 1"  (1.549 m)   Wt 168 lb 6.4 oz (76.4  kg)   SpO2 97%   BMI 31.82 kg/m   Assessment and Plan: 1. Uncontrolled type 2 diabetes mellitus without complication, without long-term current use of insulin (HCC) Continue metformin Add losartan for BP and renal protection - Comprehensive metabolic panel - Hemoglobin A1c - losartan (COZAAR) 50 MG tablet; Take 1 tablet (50 mg total) by mouth daily.  Dispense: 30 tablet; Refill: 5  2. Esophageal dysphagia Resume PPI and refer to GI - Ambulatory referral to Gastroenterology - CBC with Differential/Platelet  3. Restless legs syndrome Continue ropinirole but take earlier in the evening - rOPINIRole (REQUIP) 1 MG tablet; Take 3 tablets (3 mg total) by mouth at bedtime.  Dispense: 90 tablet; Refill: 5  4. Moderate persistent asthma without complication stable  5. Depression, major, recurrent, in partial remission (HCC) Doing well - continue  Psych follow up  6. Essential hypertension Begin ARB   Meds ordered this encounter  Medications  . DISCONTD: rOPINIRole (REQUIP) 1 MG tablet    Sig: Take 3 tablets (3 mg total) by mouth at bedtime.    Dispense:  90 tablet    Refill:  5  . omeprazole (PRILOSEC) 20 MG capsule    Sig: Take 1 capsule (20 mg total) by mouth 2 (two) times daily before a meal.    Dispense:  60 capsule    Refill:  5  . rOPINIRole (REQUIP) 1 MG tablet    Sig: Take 3 tablets (3 mg total) by mouth at bedtime.    Dispense:  90 tablet    Refill:  5  . losartan (COZAAR) 50 MG tablet    Sig: Take 1 tablet (50 mg total) by mouth daily.    Dispense:  30 tablet    Refill:  5    Partially dictated using Animal nutritionist. Any errors are unintentional.  Bari Edward, MD [facility]  12/31/2016

## 2017-01-01 ENCOUNTER — Other Ambulatory Visit: Payer: Self-pay | Admitting: Internal Medicine

## 2017-01-02 ENCOUNTER — Other Ambulatory Visit: Payer: Self-pay | Admitting: Internal Medicine

## 2017-01-02 LAB — COMPREHENSIVE METABOLIC PANEL
ALBUMIN: 4.4 g/dL (ref 3.5–5.5)
ALT: 40 IU/L — AB (ref 0–32)
AST: 47 IU/L — ABNORMAL HIGH (ref 0–40)
Albumin/Globulin Ratio: 1.5 (ref 1.2–2.2)
Alkaline Phosphatase: 89 IU/L (ref 39–117)
BILIRUBIN TOTAL: 0.3 mg/dL (ref 0.0–1.2)
BUN/Creatinine Ratio: 11 (ref 9–23)
BUN: 7 mg/dL (ref 6–24)
CALCIUM: 9.5 mg/dL (ref 8.7–10.2)
CHLORIDE: 95 mmol/L — AB (ref 96–106)
CO2: 25 mmol/L (ref 20–29)
CREATININE: 0.63 mg/dL (ref 0.57–1.00)
GFR, EST AFRICAN AMERICAN: 122 mL/min/{1.73_m2} (ref >59)
GFR, EST NON AFRICAN AMERICAN: 106 mL/min/{1.73_m2} (ref >59)
GLUCOSE: 111 mg/dL — AB (ref 65–99)
Globulin, Total: 2.9 g/dL (ref 1.5–4.5)
Potassium: 4.1 mmol/L (ref 3.5–5.2)
Sodium: 138 mmol/L (ref 134–144)
TOTAL PROTEIN: 7.3 g/dL (ref 6.0–8.5)

## 2017-01-02 LAB — CBC WITH DIFFERENTIAL/PLATELET
Basophils Absolute: 0.1 10*3/uL (ref 0.0–0.2)
Basos: 1 %
EOS (ABSOLUTE): 0.1 10*3/uL (ref 0.0–0.4)
EOS: 1 %
HEMATOCRIT: 40.9 % (ref 34.0–46.6)
Hemoglobin: 13.7 g/dL (ref 11.1–15.9)
IMMATURE GRANS (ABS): 0 10*3/uL (ref 0.0–0.1)
IMMATURE GRANULOCYTES: 0 %
LYMPHS: 29 %
Lymphocytes Absolute: 2.7 10*3/uL (ref 0.7–3.1)
MCH: 28.7 pg (ref 26.6–33.0)
MCHC: 33.5 g/dL (ref 31.5–35.7)
MCV: 86 fL (ref 79–97)
MONOS ABS: 0.4 10*3/uL (ref 0.1–0.9)
Monocytes: 5 %
NEUTROS PCT: 64 %
Neutrophils Absolute: 5.9 10*3/uL (ref 1.4–7.0)
PLATELETS: 313 10*3/uL (ref 150–379)
RBC: 4.78 x10E6/uL (ref 3.77–5.28)
RDW: 15.4 % (ref 12.3–15.4)
WBC: 9.2 10*3/uL (ref 3.4–10.8)

## 2017-01-02 LAB — HEMOGLOBIN A1C
Est. average glucose Bld gHb Est-mCnc: 180 mg/dL
Hgb A1c MFr Bld: 7.9 % — ABNORMAL HIGH (ref 4.8–5.6)

## 2017-01-02 MED ORDER — SAXAGLIPTIN HCL 5 MG PO TABS: 5.0000 mg | ORAL_TABLET | Freq: Every day | ORAL | 5 refills | Status: DC

## 2017-01-03 ENCOUNTER — Telehealth: Payer: Self-pay

## 2017-01-03 NOTE — Telephone Encounter (Signed)
Patient called and left VM stating she is wondering what lab results are because she got a new medication at pharmacy- also called about GI referral stating she has not heard anything.  I called patient about labs but was unable to reach her. I called today to discuss labs and referral but phone rings once and went to say VM is not set up yet. Unable to leave VM and reach pt to discuss medication, labs, or referral.

## 2017-01-06 ENCOUNTER — Other Ambulatory Visit: Payer: Self-pay | Admitting: Internal Medicine

## 2017-01-06 DIAGNOSIS — G2581 Restless legs syndrome: Secondary | ICD-10-CM

## 2017-01-08 ENCOUNTER — Telehealth: Payer: Self-pay

## 2017-01-08 ENCOUNTER — Other Ambulatory Visit: Payer: Self-pay

## 2017-01-08 ENCOUNTER — Other Ambulatory Visit: Payer: Self-pay | Admitting: Internal Medicine

## 2017-01-08 DIAGNOSIS — G2581 Restless legs syndrome: Secondary | ICD-10-CM

## 2017-01-08 MED ORDER — ROPINIROLE HCL 3 MG PO TABS: 3.0000 mg | ORAL_TABLET | Freq: Every day | ORAL | 5 refills | Status: DC

## 2017-01-08 NOTE — Telephone Encounter (Signed)
Patient called stating she needs Ropinirole 3 mg sent to pharmacy. Stated she contacted pharmacy- [facility] [place], [place]- and they had not received RX. I Called pharmacy myself and they stated it was denied by physician and we will have to send new Rvx. Patient stated you were going to send in 3 mg instead of 1 mg 3X Daily. Please Advise.

## 2017-01-08 NOTE — Telephone Encounter (Signed)
Pt informed Rx 3 mg sent.

## 2017-01-08 NOTE — Telephone Encounter (Signed)
On 8/27 I sent in an Rx for 90 - 1 mg ropinirole with the instructions to take 3 at bedtime.  I did this so that she could adjust between 2 and 3 as needed.  I never sent instructions to take three times per day. However, since it is such a problem, I sent in a new Rx for 3 mg at hs.

## 2017-01-09 ENCOUNTER — Other Ambulatory Visit: Payer: Self-pay | Admitting: Internal Medicine

## 2017-01-09 DIAGNOSIS — G2581 Restless legs syndrome: Secondary | ICD-10-CM

## 2017-01-10 ENCOUNTER — Other Ambulatory Visit: Payer: Self-pay | Admitting: Internal Medicine

## 2017-01-17 ENCOUNTER — Other Ambulatory Visit: Payer: Self-pay | Admitting: Internal Medicine

## 2017-01-25 ENCOUNTER — Telehealth: Payer: Self-pay

## 2017-01-25 ENCOUNTER — Other Ambulatory Visit: Payer: Self-pay | Admitting: Internal Medicine

## 2017-01-25 DIAGNOSIS — G2581 Restless legs syndrome: Secondary | ICD-10-CM

## 2017-01-25 MED ORDER — ROPINIROLE HCL 1 MG PO TABS: 1.0000 mg | ORAL_TABLET | Freq: Every day | ORAL | 5 refills | Status: DC

## 2017-01-25 NOTE — Telephone Encounter (Signed)
If she thinks the higher dose of ropinirole is causing symptoms, then resume 1 mg dose.  Otherwise, she will need an appointment to change medications (there are not a lot of alternatives).

## 2017-01-25 NOTE — Telephone Encounter (Signed)
Patient called leaving VM-   States she is having side affects from Ropinirole: Swelling, tingling in feet, and hair is falling out more than usual.  Wants to know if there is anything different she can try?   Please Advise.

## 2017-01-25 NOTE — Telephone Encounter (Signed)
Would like to try the 1 mg again. If still having problems will make appt- [facility] [place].

## 2017-01-31 ENCOUNTER — Encounter: Payer: Self-pay | Admitting: Internal Medicine

## 2017-01-31 ENCOUNTER — Ambulatory Visit (INDEPENDENT_AMBULATORY_CARE_PROVIDER_SITE_OTHER): Payer: BLUE CROSS/BLUE SHIELD | Admitting: Internal Medicine

## 2017-01-31 VITALS — BP 138/90 | HR 94 | Temp 98.6°F | Ht 61.0 in | Wt 169.0 lb

## 2017-01-31 DIAGNOSIS — G2581 Restless legs syndrome: Secondary | ICD-10-CM

## 2017-01-31 DIAGNOSIS — K649 Unspecified hemorrhoids: Secondary | ICD-10-CM | POA: Diagnosis not present

## 2017-01-31 DIAGNOSIS — J40 Bronchitis, not specified as acute or chronic: Secondary | ICD-10-CM

## 2017-01-31 MED ORDER — PRAMIPEXOLE DIHYDROCHLORIDE 0.5 MG PO TABS: 0.5000 mg | ORAL_TABLET | Freq: Every day | ORAL | 3 refills | Status: DC

## 2017-01-31 MED ORDER — HYDROCORTISONE ACE-PRAMOXINE 1-1 % RE CREA: 1.0000 "application " | TOPICAL_CREAM | Freq: Two times a day (BID) | RECTAL | 0 refills | Status: DC

## 2017-01-31 MED ORDER — DOXYCYCLINE HYCLATE 100 MG PO TABS: 100.0000 mg | ORAL_TABLET | Freq: Two times a day (BID) | ORAL | 0 refills | Status: DC

## 2017-01-31 MED ORDER — GUAIFENESIN-CODEINE 100-10 MG/5ML PO SYRP: 5.0000 mL | ORAL_SOLUTION | Freq: Three times a day (TID) | ORAL | 0 refills | Status: DC | PRN

## 2017-01-31 NOTE — Progress Notes (Signed)
Date:  01/31/2017   Name:  Crystal Rich   DOB:  12-Jan-1966   MRN:  [id number]   Chief Complaint: Cough (X 4 days. Feeling sluggish. Worse yesterday. Head stopped up, throat hurting, chest congestion. Feeling like can't take deep breathe. No production.  )  Cough  The current episode started in the past 7 days. The problem has been unchanged. The problem occurs every few minutes. The cough is non-productive. Associated symptoms include a sore throat and shortness of breath. Pertinent negatives include no chest pain, chills, fever, headaches, nasal congestion, postnasal drip, rash or wheezing. The symptoms are aggravated by exercise and lying down. She has tried a beta-agonist inhaler for the symptoms.   Rectal pain and itching - noticed more discomfort over the past few days.  Has hx of hemorrhoids during pregnancy.  No bleeding or itching.  No recent diarrhea or constipation.  Restless Leg - she had been taking Requip 1 mg but with increased symptoms increase this to 3 mg. At the higher dose she had burning and discomfort in her feet so she went back to 1 mg. Symptoms however occurring nightly reducing her ability to go to sleep. She's never tried any other medications.  Review of Systems  Constitutional: Positive for fatigue. Negative for chills and fever.  HENT: Positive for congestion and sore throat. Negative for postnasal drip, trouble swallowing and voice change.   Eyes: Negative for visual disturbance.  Respiratory: Positive for cough, chest tightness and shortness of breath. Negative for wheezing.   Cardiovascular: Negative for chest pain and palpitations.  Gastrointestinal: Negative for anal bleeding, blood in stool, constipation and diarrhea.  Skin: Negative for rash.  Neurological: Negative for dizziness and headaches.    Patient Active Problem List   Diagnosis Date Noted  . Dysphagia 12/31/2016  . Restless legs syndrome 12/31/2016  . Essential hypertension 12/31/2016   . Lymphadenopathy, axillary 04/25/2015  . Uncontrolled type 2 diabetes mellitus without complication, without long-term current use of insulin (HCC) 04/25/2015  . Insomnia 03/11/2015  . Chronic abdominal pain 11/17/2014  . C. difficile colitis 11/17/2014  . Disorder of esophagus 11/17/2014  . Anxiety, generalized 11/17/2014  . 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 Hot flash, menopausal 11/17/2014  . Migraine without aura and responsive to treatment 11/17/2014  . Asthma, moderate persistent 11/17/2014  . Abdominal pain 04/02/2012    Prior to Admission medications   Medication Sig Start Date End Date Taking? Authorizing Provider  albuterol (PROVENTIL HFA) 108 (90 Base) MCG/ACT inhaler UAD 01/25/17  Yes Reubin Milan, MD  clonazePAM Scarlette Calico) 1 MG tablet  04/01/15  Yes [provider]  escitalopram (LEXAPRO) 20 MG tablet Take 2 tablets by mouth daily at 2 PM daily at 2 PM.    Yes [provider]  EST ESTROGENS-METHYLTEST HS 0.625-1.25 MG tablet TAKE 1 TABLET BY MOUTH EVERY DAY AS NEEDED 10/09/16  Yes Reubin Milan, MD  fluticasone Aleda Grana) 50 MCG/ACT nasal spray  03/22/15  Yes [provider]  hydrOXYzine (ATARAX/VISTARIL) 25 MG tablet Take 2 tablets by mouth at bedtime.  02/24/15  Yes [provider]  losartan (COZAAR) 50 MG tablet Take 1 tablet (50 mg total) by mouth daily. 12/31/16  Yes Reubin Milan, MD  metFORMIN (GLUCOPHAGE) 500 MG tablet TAKE 2 TABLETS BY MOUTH IN THE MORNING AND 1 TABLET IN THE EVENING 01/10/17  Yes Reubin Milan, MD  montelukast (SINGULAIR) 10 MG tablet TAKE 1 TABLET (10  MG TOTAL) BY MOUTH AT BEDTIME. 10/21/16  Yes Reubin Milan, MD  omeprazole (PRILOSEC) 20 MG capsule Take 1 capsule (20 mg total) by mouth 2 (two) times daily before a meal. 12/31/16  Yes Reubin Milan, MD  promethazine (PHENERGAN) 25 MG suppository Place 1 suppository (25 mg total) rectally every 6 (six) hours as  needed for nausea or vomiting. 06/04/16  Yes Reubin Milan, MD  promethazine (PHENERGAN) 25 MG tablet TAKE 1 TABLET BY MOUTH TWICE A DAY AS NEEDED 01/17/17  Yes Reubin Milan, MD  rizatriptan (MAXALT-MLT) 10 MG disintegrating tablet DISSOLVE 1 TABLET ON TONGUE TWICE A DAY AS NEEDED 01/01/17  Yes Reubin Milan, MD  rOPINIRole (REQUIP) 1 MG tablet Take 1 tablet (1 mg total) by mouth at bedtime. 01/25/17  Yes Reubin Milan, MD  saxagliptin HCl (ONGLYZA) 5 MG TABS tablet Take 1 tablet (5 mg total) by mouth daily. 01/02/17  Yes Reubin Milan, MD  theophylline (UNIPHYL) 400 MG 24 hr tablet TAKE 1 TABLET (400 MG TOTAL) BY MOUTH DAILY. 09/21/16  Yes Reubin Milan, MD  traMADol Janean Sark) 50 MG tablet  03/30/15  Yes [provider]    Allergies  Allergen Reactions  . Nsaids Shortness Of Breath  . Aspirin   . Ketorolac     Other reaction(s): Other (See Comments) Patient states Burning sensation inside Other reaction(s): Other (See Comments) Patient states Burning sensation inside  . Macrolides And Ketolides   . Morphine Sulfate Nausea And Vomiting  . Nalbuphine     Other reaction(s): Other (See Comments) Burning on inside  . Prochlorperazine Edisylate Other (See Comments)    tachycardia  . Sulfa Antibiotics   . Trazodone Cough  . Bupropion Rash  . Oxybutynin Rash    Past Surgical History:  Procedure Laterality Date  . ABDOMINAL HYSTERECTOMY  1999  . APPENDECTOMY    . BILATERAL SALPINGOOPHORECTOMY  2010  . CHOLECYSTECTOMY  2005  . COLONOSCOPY  2016    Social History  Substance Use Topics  . Smoking status: Never Smoker  . Smokeless tobacco: Never Used  . Alcohol use No     Medication list has been reviewed and updated.  PHQ 2/9 Scores 12/31/2016  PHQ - 2 Score 1  PHQ- 9 Score 2    Physical Exam  Constitutional: She is oriented to person, place, and time. She appears well-developed. No distress.  HENT:  Head: Normocephalic and atraumatic.    Cardiovascular: Normal rate, regular rhythm and normal heart sounds.   Pulmonary/Chest: Effort normal. No respiratory distress. She has decreased breath sounds. She has no wheezes. She has no rhonchi.  Genitourinary: Rectal exam shows external hemorrhoid and tenderness.  Musculoskeletal: Normal range of motion.  Neurological: She is alert and oriented to person, place, and time.  Skin: Skin is warm and dry. No rash noted.  Psychiatric: She has a normal mood and affect. Her behavior is normal. Thought content normal.  Nursing note and vitals reviewed.   BP 138/90 (BP Location: Right Arm, Patient Position: Sitting, Cuff Size: Normal)   Pulse 94   Temp 98.6 F (37 C) (Oral)   Ht  (1.549 m)   Wt 169 lb (76.7 kg)   SpO2 97%   BMI 31.93 kg/m   Assessment and Plan: 1. Bronchitis - doxycycline (VIBRA-TABS) 100 MG tablet; Take 1 tablet (100 mg total) by mouth 2 (two) times daily.  Dispense: 20 tablet; Refill: 0 - guaiFENesin-codeine (ROBITUSSIN AC) 100-10 MG/5ML syrup; Take  5 mLs by mouth 3 (three) times daily as needed for cough.  Dispense: 150 mL; Refill: 0  2. Hemorrhoids, unspecified hemorrhoid type - pramoxine-hydrocortisone (PROCTOCREAM-HC) 1-1 % rectal cream; Place 1 application rectally 2 (two) times daily.  Dispense: 30 g; Refill: 0  3. Restless legs syndrome Stop Requip, start low dose Mirapex - pramipexole (MIRAPEX) 0.5 MG tablet; Take 1 tablet (0.5 mg total) by mouth at bedtime.  Dispense: 30 tablet; Refill: 3   Meds ordered this encounter  Medications  . doxycycline (VIBRA-TABS) 100 MG tablet    Sig: Take 1 tablet (100 mg total) by mouth 2 (two) times daily.    Dispense:  20 tablet    Refill:  0  . pramoxine-hydrocortisone (PROCTOCREAM-HC) 1-1 % rectal cream    Sig: Place 1 application rectally 2 (two) times daily.    Dispense:  30 g    Refill:  0  . guaiFENesin-codeine (ROBITUSSIN AC) 100-10 MG/5ML syrup    Sig: Take 5 mLs by mouth 3 (three) times daily as  needed for cough.    Dispense:  150 mL    Refill:  0  . pramipexole (MIRAPEX) 0.5 MG tablet    Sig: Take 1 tablet (0.5 mg total) by mouth at bedtime.    Dispense:  30 tablet    Refill:  3    Partially dictated using Animal nutritionist. Any errors are unintentional.  Bari Edward, MD [facility]  01/31/2017

## 2017-02-10 ENCOUNTER — Other Ambulatory Visit: Payer: Self-pay | Admitting: Internal Medicine

## 2017-02-19 ENCOUNTER — Telehealth: Payer: Self-pay

## 2017-02-19 ENCOUNTER — Other Ambulatory Visit: Payer: Self-pay | Admitting: Internal Medicine

## 2017-02-19 DIAGNOSIS — G2581 Restless legs syndrome: Secondary | ICD-10-CM

## 2017-02-19 MED ORDER — PRAMIPEXOLE DIHYDROCHLORIDE 1 MG PO TABS: 1.0000 mg | ORAL_TABLET | Freq: Every day | ORAL | 0 refills | Status: DC

## 2017-02-19 NOTE — Telephone Encounter (Signed)
Patient called stated she was last seen for restless legs. Was given Mirapex medication. States medication is working but not enough. Wants to know can have medication dosing higher to help completely. Please Advise.

## 2017-03-22 ENCOUNTER — Other Ambulatory Visit: Payer: Self-pay | Admitting: Internal Medicine

## 2017-03-22 DIAGNOSIS — G2581 Restless legs syndrome: Secondary | ICD-10-CM

## 2017-04-01 ENCOUNTER — Other Ambulatory Visit: Payer: Self-pay

## 2017-04-01 MED ORDER — METFORMIN HCL 500 MG PO TABS: 1000.0000 mg | ORAL_TABLET | Freq: Every day | ORAL | 1 refills | Status: DC

## 2017-04-04 ENCOUNTER — Telehealth: Payer: Self-pay

## 2017-04-04 ENCOUNTER — Other Ambulatory Visit: Payer: Self-pay | Admitting: Internal Medicine

## 2017-04-04 DIAGNOSIS — K229 Disease of esophagus, unspecified: Secondary | ICD-10-CM

## 2017-04-04 DIAGNOSIS — Z8619 Personal history of other infectious and parasitic diseases: Secondary | ICD-10-CM | POA: Insufficient documentation

## 2017-04-04 NOTE — Telephone Encounter (Signed)
Patient had appt today at [facility]. [place] They stated she could not be seen since she was late and can not reschedule until February.   Patient states her insurance will change to [facility] in January- now wants referral [facility] in [place]. There number is [phone]. They believe they can see her sooner.   Please Advise.

## 2017-04-06 ENCOUNTER — Other Ambulatory Visit: Payer: Self-pay | Admitting: Internal Medicine

## 2017-04-16 ENCOUNTER — Other Ambulatory Visit: Payer: Self-pay | Admitting: Internal Medicine

## 2017-04-16 MED ORDER — SAXAGLIPTIN HCL 5 MG PO TABS: 5.0000 mg | ORAL_TABLET | Freq: Every day | ORAL | 1 refills | Status: DC

## 2017-04-17 ENCOUNTER — Telehealth: Payer: Self-pay

## 2017-04-17 NOTE — Telephone Encounter (Signed)
Patient called asking about referral sent to [facility]. Informed patient information was faxed on 04-04-2017. Gave patient there telephone number and informed her it can take time for them to get to her referral. She verbalized understanding.

## 2017-04-26 ENCOUNTER — Telehealth: Payer: Self-pay

## 2017-04-26 ENCOUNTER — Other Ambulatory Visit: Payer: Self-pay | Admitting: Internal Medicine

## 2017-04-26 NOTE — Telephone Encounter (Signed)
Patient called requesting refill on Maxalt medication to [facility]. Please Advise.

## 2017-04-27 ENCOUNTER — Other Ambulatory Visit: Payer: Self-pay | Admitting: Internal Medicine

## 2017-04-27 DIAGNOSIS — IMO0001 Reserved for inherently not codable concepts without codable children: Secondary | ICD-10-CM

## 2017-04-27 DIAGNOSIS — E1165 Type 2 diabetes mellitus with hyperglycemia: Principal | ICD-10-CM

## 2017-05-03 ENCOUNTER — Other Ambulatory Visit: Payer: Self-pay

## 2017-05-03 ENCOUNTER — Ambulatory Visit: Payer: Self-pay | Admitting: Internal Medicine

## 2017-05-03 MED ORDER — ALBUTEROL SULFATE HFA 108 (90 BASE) MCG/ACT IN AERS: INHALATION_SPRAY | RESPIRATORY_TRACT | 0 refills | Status: DC

## 2017-05-08 ENCOUNTER — Encounter: Payer: Self-pay | Admitting: Internal Medicine

## 2017-05-08 ENCOUNTER — Ambulatory Visit: Payer: BLUE CROSS/BLUE SHIELD | Admitting: Internal Medicine

## 2017-05-08 VITALS — BP 132/78 | HR 102 | Ht 61.0 in | Wt 170.0 lb

## 2017-05-08 DIAGNOSIS — J0100 Acute maxillary sinusitis, unspecified: Secondary | ICD-10-CM

## 2017-05-08 DIAGNOSIS — E1165 Type 2 diabetes mellitus with hyperglycemia: Secondary | ICD-10-CM | POA: Diagnosis not present

## 2017-05-08 DIAGNOSIS — F3341 Major depressive disorder, recurrent, in partial remission: Secondary | ICD-10-CM

## 2017-05-08 DIAGNOSIS — IMO0001 Reserved for inherently not codable concepts without codable children: Secondary | ICD-10-CM

## 2017-05-08 MED ORDER — AMOXICILLIN 875 MG PO TABS: 875.0000 mg | ORAL_TABLET | Freq: Two times a day (BID) | ORAL | 0 refills | Status: DC

## 2017-05-08 MED ORDER — PROMETHAZINE HCL 25 MG PO TABS: 25.0000 mg | ORAL_TABLET | Freq: Two times a day (BID) | ORAL | 5 refills | Status: DC | PRN

## 2017-05-08 NOTE — Progress Notes (Signed)
Date:  05/08/2017   Name:  Crystal Rich   DOB:  January 10, 1966   MRN:  [id number]   Chief Complaint: Diabetes and Depression Diabetes  She presents for her follow-up diabetic visit. She has type 2 diabetes mellitus. Her disease course has been stable. Hypoglycemia symptoms include headaches. Pertinent negatives for hypoglycemia include no dizziness or tremors. Pertinent negatives for diabetes include no chest pain and no weakness. Current diabetic treatment includes oral agent (dual therapy) (metformin and onlgyza).  Depression         This is a chronic problem.  The problem occurs intermittently.The problem is unchanged.  Associated symptoms include myalgias and headaches.  Past treatments include SSRIs - Selective serotonin reuptake inhibitors and VNS - Vagus Nerve Stimulation.  Previous treatment provided significant relief. Influenza  This is a new problem. The current episode started 1 to 4 weeks ago. The problem has been gradually improving. Associated symptoms include chills, congestion, coughing, a fever, headaches, myalgias, a rash and vomiting. Pertinent negatives include no chest pain, numbness, sore throat or weakness.   Lab Results  Component Value Date   HGBA1C 7.9 (H) 01/01/2017   Lab Results  Component Value Date   CREATININE 0.63 01/01/2017   BUN 7 01/01/2017   NA 138 01/01/2017   K 4.1 01/01/2017   CL 95 (L) 01/01/2017   CO2 25 01/01/2017   Lab Results  Component Value Date   WBC 9.2 01/01/2017   HGB 13.7 01/01/2017   HCT 40.9 01/01/2017   MCV 86 01/01/2017   PLT 313 01/01/2017     Review of Systems  Constitutional: Positive for chills and fever.  HENT: Positive for congestion, ear pain and sinus pressure. Negative for hearing loss, mouth sores and sore throat.   Eyes: Negative for visual disturbance.  Respiratory: Positive for cough. Negative for chest tightness, shortness of breath and wheezing.   Cardiovascular: Negative for chest pain and  palpitations.  Gastrointestinal: Positive for abdominal distention, diarrhea and vomiting.  Musculoskeletal: Positive for myalgias.  Skin: Positive for rash.  Neurological: Positive for headaches. Negative for dizziness, tremors, syncope, weakness and numbness.  Hematological: Negative for adenopathy.  Psychiatric/Behavioral: Positive for depression and dysphoric mood. Negative for sleep disturbance.    Patient Active Problem List   Diagnosis Date Noted  . Hemorrhoids 01/31/2017  . Dysphagia 12/31/2016  . Restless legs syndrome 12/31/2016  . Essential hypertension 12/31/2016  . Lymphadenopathy, axillary 04/25/2015  . Uncontrolled type 2 diabetes mellitus without complication, without long-term current use of insulin (HCC) 04/25/2015  . Insomnia 03/11/2015  . Chronic abdominal pain 11/17/2014  . C. difficile colitis 11/17/2014  . Disorder of esophagus 11/17/2014  . Anxiety, generalized 11/17/2014  . 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 Hot flash, menopausal 11/17/2014  . Migraine without aura and responsive to treatment 11/17/2014  . Asthma, moderate persistent 11/17/2014  . Abdominal pain 04/02/2012    Prior to Admission medications   Medication Sig Start Date End Date Taking? Authorizing Provider  albuterol (PROVENTIL HFA) 108 (90 Base) MCG/ACT inhaler UAD 05/03/17  Yes Reubin MilanBerglund, Colleen Kotlarz H, MD  clonazePAM Scarlette Calico(KLONOPIN) 1 MG tablet  04/01/15  Yes [provider]  escitalopram (LEXAPRO) 20 MG tablet Take 2 tablets by mouth daily at 2 PM daily at 2 PM.    Yes [provider]  EST ESTROGENS-METHYLTEST HS 0.625-1.25 MG tablet TAKE 1 TABLET BY MOUTH EVERY DAY AS NEEDED 04/08/17  Yes Reubin MilanBerglund, Allee Busk H, MD  fluticasone Aleda Grana(FLONASE)  50 MCG/ACT nasal spray  03/22/15  Yes [provider]  hydrOXYzine (ATARAX/VISTARIL) 25 MG tablet Take 2 tablets by mouth at bedtime.  02/24/15  Yes [provider]  losartan (COZAAR) 50 MG tablet  TAKE 1 TABLET BY MOUTH EVERY DAY 04/27/17  Yes Reubin Milan, MD  metFORMIN (GLUCOPHAGE) 500 MG tablet Take 2 tablets (1,000 mg total) by mouth daily. with food 04/01/17  Yes Reubin Milan, MD  montelukast (SINGULAIR) 10 MG tablet TAKE 1 TABLET (10 MG TOTAL) BY MOUTH AT BEDTIME. 10/21/16  Yes Reubin Milan, MD  omeprazole (PRILOSEC) 20 MG capsule Take 1 capsule (20 mg total) by mouth 2 (two) times daily before a meal. 12/31/16  Yes Reubin Milan, MD  pramipexole (MIRAPEX) 1 MG tablet TAKE 1 TABLET (1 MG TOTAL) BY MOUTH AT BEDTIME. 03/22/17  Yes Reubin Milan, MD  pramoxine-hydrocortisone (PROCTOCREAM-HC) 1-1 % rectal cream Place 1 application rectally 2 (two) times daily. 01/31/17  Yes Reubin Milan, MD  promethazine (PHENERGAN) 25 MG tablet TAKE 1 TABLET BY MOUTH TWICE A DAY AS NEEDED 02/10/17  Yes Reubin Milan, MD  rizatriptan (MAXALT-MLT) 10 MG disintegrating tablet DISSOLVE 1 TABLET ON TONGUE TWICE A DAY AS NEEDED 04/26/17  Yes Reubin Milan, MD  saxagliptin HCl (ONGLYZA) 5 MG TABS tablet Take 1 tablet (5 mg total) by mouth daily. 04/16/17  Yes Reubin Milan, MD  theophylline (UNIPHYL) 400 MG 24 hr tablet TAKE 1 TABLET (400 MG TOTAL) BY MOUTH DAILY. 09/21/16  Yes Reubin Milan, MD  traMADol Janean Sark) 50 MG tablet  03/30/15  Yes [provider]    Allergies  Allergen Reactions  . Nsaids Shortness Of Breath  . Aspirin   . Ketorolac     Other reaction(s): Other (See Comments) Patient states Burning sensation inside Other reaction(s): Other (See Comments) Patient states Burning sensation inside  . Macrolides And Ketolides   . Morphine Sulfate Nausea And Vomiting  . Nalbuphine     Other reaction(s): Other (See Comments) Burning on inside  . Prochlorperazine Edisylate Other (See Comments)    tachycardia  . Sulfa Antibiotics   . Trazodone Cough  . Bupropion Rash  . Oxybutynin Rash    Past Surgical History:  Procedure Laterality Date  .  ABDOMINAL HYSTERECTOMY  1999  . APPENDECTOMY    . BILATERAL SALPINGOOPHORECTOMY  2010  . CHOLECYSTECTOMY  2005  . COLONOSCOPY  2016    Social History   Tobacco Use  . Smoking status: Never Smoker  . Smokeless tobacco: Never Used  Substance Use Topics  . Alcohol use: No    Alcohol/week: 0.0 oz  . Drug use: No     Medication list has been reviewed and updated.  PHQ 2/9 Scores 05/08/2017 12/31/2016  PHQ - 2 Score 0 1  PHQ- 9 Score 0 2    Physical Exam  Constitutional: She is oriented to person, place, and time. She appears well-developed. No distress.  HENT:  Head: Normocephalic and atraumatic.  Right Ear: Ear canal normal. Tympanic membrane is erythematous and bulging.  Left Ear: Tympanic membrane and ear canal normal.  Nose: Right sinus exhibits maxillary sinus tenderness. Left sinus exhibits maxillary sinus tenderness.  Mouth/Throat: No posterior oropharyngeal edema or posterior oropharyngeal erythema.  Neck: Normal range of motion. Neck supple.  Cardiovascular: Normal rate, regular rhythm and normal heart sounds.  Pulmonary/Chest: Effort normal and breath sounds normal. No respiratory distress. She has no wheezes.  Musculoskeletal: She exhibits no edema.  Neurological: She is alert and oriented to person, place, and time.  Skin: Skin is warm and dry. No rash noted.  Psychiatric: She has a normal mood and affect. Her behavior is normal. Thought content normal.  Nursing note and vitals reviewed.   BP 132/78   Pulse (!) 102   Ht 5\' 1"  (1.549 m)   Wt 170 lb (77.1 kg)   SpO2 98%   BMI 32.12 kg/m   Assessment and Plan: 1. Depression, major, recurrent, in partial remission (HCC) Doing well on current therapy  2. Uncontrolled type 2 diabetes mellitus without complication, without long-term current use of insulin (HCC) Continue current medication Discussed statin therapy - Comprehensive metabolic panel - Lipid panel - Hemoglobin A1c  3. Acute maxillary sinusitis,  recurrence not specified - amoxicillin (AMOXIL) 875 MG tablet; Take 1 tablet (875 mg total) by mouth 2 (two) times daily.  Dispense: 20 tablet; Refill: 0   Meds ordered this encounter  Medications  . promethazine (PHENERGAN) 25 MG tablet    Sig: Take 1 tablet (25 mg total) by mouth 2 (two) times daily as needed.    Dispense:  30 tablet    Refill:  5  . amoxicillin (AMOXIL) 875 MG tablet    Sig: Take 1 tablet (875 mg total) by mouth 2 (two) times daily.    Dispense:  20 tablet    Refill:  0    Partially dictated using Animal nutritionist. Any errors are unintentional.  Bari Edward, MD [facility]  05/08/2017

## 2017-05-09 LAB — COMPREHENSIVE METABOLIC PANEL
A/G RATIO: 1.4 (ref 1.2–2.2)
ALBUMIN: 4.2 g/dL (ref 3.5–5.5)
ALT: 31 IU/L (ref 0–32)
AST: 29 IU/L (ref 0–40)
Alkaline Phosphatase: 105 IU/L (ref 39–117)
BILIRUBIN TOTAL: 0.2 mg/dL (ref 0.0–1.2)
BUN / CREAT RATIO: 14 (ref 9–23)
BUN: 10 mg/dL (ref 6–24)
CHLORIDE: 97 mmol/L (ref 96–106)
CO2: 24 mmol/L (ref 20–29)
Calcium: 9.7 mg/dL (ref 8.7–10.2)
Creatinine, Ser: 0.73 mg/dL (ref 0.57–1.00)
GFR calc non Af Amer: 96 mL/min/{1.73_m2} (ref >59)
GFR, EST AFRICAN AMERICAN: 111 mL/min/{1.73_m2} (ref >59)
GLUCOSE: 207 mg/dL — AB (ref 65–99)
Globulin, Total: 3.1 g/dL (ref 1.5–4.5)
Potassium: 4.3 mmol/L (ref 3.5–5.2)
Sodium: 137 mmol/L (ref 134–144)
TOTAL PROTEIN: 7.3 g/dL (ref 6.0–8.5)

## 2017-05-09 LAB — LIPID PANEL
Chol/HDL Ratio: 4.7 ratio — ABNORMAL HIGH (ref 0.0–4.4)
Cholesterol, Total: 151 mg/dL (ref 100–199)
HDL: 32 mg/dL — ABNORMAL LOW (ref >39)
LDL Calculated: 64 mg/dL (ref 0–99)
Triglycerides: 274 mg/dL — ABNORMAL HIGH (ref 0–149)
VLDL Cholesterol Cal: 55 mg/dL — ABNORMAL HIGH (ref 5–40)

## 2017-05-09 LAB — HEMOGLOBIN A1C
ESTIMATED AVERAGE GLUCOSE: 203 mg/dL
HEMOGLOBIN A1C: 8.7 % — AB (ref 4.8–5.6)

## 2017-05-19 ENCOUNTER — Other Ambulatory Visit: Payer: Self-pay | Admitting: Internal Medicine

## 2017-05-19 DIAGNOSIS — G2581 Restless legs syndrome: Secondary | ICD-10-CM

## 2017-05-24 ENCOUNTER — Other Ambulatory Visit: Payer: Self-pay | Admitting: Internal Medicine

## 2017-05-24 DIAGNOSIS — K649 Unspecified hemorrhoids: Secondary | ICD-10-CM

## 2017-05-29 ENCOUNTER — Other Ambulatory Visit: Payer: Self-pay

## 2017-05-29 DIAGNOSIS — G2581 Restless legs syndrome: Secondary | ICD-10-CM

## 2017-05-29 MED ORDER — ROPINIROLE HCL 3 MG PO TABS: 3.0000 mg | ORAL_TABLET | Freq: Every day | ORAL | 1 refills | Status: DC

## 2017-06-03 ENCOUNTER — Encounter: Payer: Self-pay | Admitting: Internal Medicine

## 2017-06-03 ENCOUNTER — Ambulatory Visit: Payer: BLUE CROSS/BLUE SHIELD | Admitting: Internal Medicine

## 2017-06-03 VITALS — BP 150/92 | HR 122 | Temp 99.2°F | Ht 61.0 in | Wt 173.0 lb

## 2017-06-03 DIAGNOSIS — J40 Bronchitis, not specified as acute or chronic: Secondary | ICD-10-CM | POA: Diagnosis not present

## 2017-06-03 DIAGNOSIS — S46909A Unspecified injury of unspecified muscle, fascia and tendon at shoulder and upper arm level, unspecified arm, initial encounter: Secondary | ICD-10-CM | POA: Diagnosis not present

## 2017-06-03 MED ORDER — LEVOFLOXACIN 500 MG PO TABS: 500.0000 mg | ORAL_TABLET | Freq: Every day | ORAL | 0 refills | Status: AC

## 2017-06-03 MED ORDER — METFORMIN HCL 500 MG PO TABS: 1500.0000 mg | ORAL_TABLET | Freq: Every day | ORAL | 3 refills | Status: DC

## 2017-06-03 MED ORDER — CYCLOBENZAPRINE HCL 10 MG PO TABS: 10.0000 mg | ORAL_TABLET | Freq: Three times a day (TID) | ORAL | 0 refills | Status: DC | PRN

## 2017-06-03 NOTE — Progress Notes (Signed)
Date:  06/03/2017   Name:  Crystal Rich   DOB:  03-Nov-1965   MRN:  [id number]   Chief Complaint: Cough (Started back up Friday. Sore throat, left ear pain, head congestion, wheezing. Coughing with little production.) and Shoulder Pain (Started a week ago. Getting worse. Can't hardly raise arm up now. Unsure if injury to arm or not. )  Cough  This is a new problem. The current episode started in the past 7 days. The problem has been gradually worsening. The problem occurs every few minutes. The cough is productive of sputum. Associated symptoms include ear congestion, a fever, nasal congestion, postnasal drip, a sore throat and wheezing. Pertinent negatives include no chest pain or chills. The symptoms are aggravated by exercise. She has tried a beta-agonist inhaler for the symptoms.  Shoulder Pain   The pain is present in the left shoulder. This is a new problem. The current episode started in the past 7 days. The problem has been unchanged. Associated symptoms include a fever. She has tried nothing for the symptoms.     Review of Systems  Constitutional: Positive for fever. Negative for chills and fatigue.  HENT: Positive for congestion, postnasal drip and sore throat. Negative for hearing loss, tinnitus and trouble swallowing.   Eyes: Negative for discharge and visual disturbance.  Respiratory: Positive for cough, chest tightness and wheezing.   Cardiovascular: Negative for chest pain, palpitations and leg swelling.  Gastrointestinal: Negative for abdominal pain.  Musculoskeletal: Positive for arthralgias.    Patient Active Problem List   Diagnosis Date Noted  . Hemorrhoids 01/31/2017  . Dysphagia 12/31/2016  . Restless legs syndrome 12/31/2016  . Essential hypertension 12/31/2016  . Lymphadenopathy, axillary 04/25/2015  . Uncontrolled type 2 diabetes mellitus without complication, without long-term current use of insulin (HCC) 04/25/2015  . Insomnia 03/11/2015  . Chronic  abdominal pain 11/17/2014  . C. difficile colitis 11/17/2014  . Disorder of esophagus 11/17/2014  . Anxiety, generalized 11/17/2014  . 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 Hot flash, menopausal 11/17/2014  . Migraine without aura and responsive to treatment 11/17/2014  . Asthma, moderate persistent 11/17/2014  . Abdominal pain 04/02/2012    Prior to Admission medications   Medication Sig Start Date End Date Taking? Authorizing Provider  albuterol (PROVENTIL HFA) 108 (90 Base) MCG/ACT inhaler UAD 05/03/17  Yes Reubin Milan, MD  clonazePAM Scarlette Calico) 1 MG tablet  04/01/15  Yes [provider]  escitalopram (LEXAPRO) 20 MG tablet Take 2 tablets by mouth daily at 2 PM daily at 2 PM.    Yes [provider]  EST ESTROGENS-METHYLTEST HS 0.625-1.25 MG tablet TAKE 1 TABLET BY MOUTH EVERY DAY AS NEEDED 04/08/17  Yes Reubin Milan, MD  fluticasone Aleda Grana) 50 MCG/ACT nasal spray  03/22/15  Yes [provider]  hydrOXYzine (ATARAX/VISTARIL) 25 MG tablet Take 2 tablets by mouth at bedtime.  02/24/15  Yes [provider]  losartan (COZAAR) 50 MG tablet TAKE 1 TABLET BY MOUTH EVERY DAY 04/27/17  Yes Reubin Milan, MD  metFORMIN (GLUCOPHAGE) 500 MG tablet Take 2 tablets (1,000 mg total) by mouth daily. with food Patient taking differently: Take 1,500 mg by mouth daily. Taking two in the morning with breakfast, and one tablet at night with dinner. 04/01/17  Yes Reubin Milan, MD  montelukast (SINGULAIR) 10 MG tablet TAKE 1 TABLET (10 MG TOTAL) BY MOUTH AT BEDTIME. 10/21/16  Yes Reubin Milan, MD  omeprazole (  PRILOSEC) 20 MG capsule Take 1 capsule (20 mg total) by mouth 2 (two) times daily before a meal. 12/31/16  Yes Reubin MilanBerglund, Ebonie Westerlund H, MD  pramipexole (MIRAPEX) 1 MG tablet TAKE 1 TABLET (1 MG TOTAL) BY MOUTH AT BEDTIME. 03/22/17  Yes Reubin MilanBerglund, Tali Cleaves H, MD  pramoxine-hydrocortisone (PROCTOCREAM-HC) 1-1 % rectal cream  PLACE 1 APPLICATION RECTALLY 2 (TWO) TIMES DAILY. 05/25/17  Yes Reubin MilanBerglund, Rhyse Skowron H, MD  promethazine (PHENERGAN) 25 MG tablet Take 1 tablet (25 mg total) by mouth 2 (two) times daily as needed. 05/08/17  Yes Reubin MilanBerglund, Philomena Buttermore H, MD  rizatriptan (MAXALT-MLT) 10 MG disintegrating tablet DISSOLVE 1 TABLET ON TONGUE TWICE A DAY AS NEEDED 04/26/17  Yes Reubin MilanBerglund, Analeia Ismael H, MD  rOPINIRole (REQUIP) 3 MG tablet Take 1 tablet (3 mg total) by mouth at bedtime. 05/29/17  Yes Reubin MilanBerglund, Dalon Reichart H, MD  saxagliptin HCl (ONGLYZA) 5 MG TABS tablet Take 1 tablet (5 mg total) by mouth daily. 04/16/17  Yes Reubin MilanBerglund, Cali Cuartas H, MD  theophylline (UNIPHYL) 400 MG 24 hr tablet TAKE 1 TABLET (400 MG TOTAL) BY MOUTH DAILY. 09/21/16  Yes Reubin MilanBerglund, Erich Kochan H, MD  traMADol Janean Sark(ULTRAM) 50 MG tablet  03/30/15  Yes [provider]    Allergies  Allergen Reactions  . Nsaids Shortness Of Breath  . Aspirin   . Ketorolac     Other reaction(s): Other (See Comments) Patient states Burning sensation inside Other reaction(s): Other (See Comments) Patient states Burning sensation inside  . Macrolides And Ketolides   . Morphine Sulfate Nausea And Vomiting  . Nalbuphine     Other reaction(s): Other (See Comments) Burning on inside  . Prochlorperazine Edisylate Other (See Comments)    tachycardia  . Sulfa Antibiotics   . Trazodone Cough  . Bupropion Rash  . Oxybutynin Rash    Past Surgical History:  Procedure Laterality Date  . ABDOMINAL HYSTERECTOMY  1999  . APPENDECTOMY    . BILATERAL SALPINGOOPHORECTOMY  2010  . CHOLECYSTECTOMY  2005  . COLONOSCOPY  2016    Social History   Tobacco Use  . Smoking status: Never Smoker  . Smokeless tobacco: Never Used  Substance Use Topics  . Alcohol use: No    Alcohol/week: 0.0 oz  . Drug use: No     Medication list has been reviewed and updated.  PHQ 2/9 Scores 05/08/2017 12/31/2016  PHQ - 2 Score 0 1  PHQ- 9 Score 0 2    Physical Exam  Constitutional: She is oriented to  person, place, and time. She appears well-developed. No distress.  HENT:  Head: Normocephalic and atraumatic.  Cardiovascular: Normal rate, regular rhythm and normal heart sounds.  Pulmonary/Chest: Breath sounds normal. She is in respiratory distress. She has no wheezes.  Musculoskeletal: Normal range of motion.       Arms: Neurological: She is alert and oriented to person, place, and time. She has normal strength. No sensory deficit.  Skin: Skin is warm and dry. No rash noted.  Psychiatric: She has a normal mood and affect. Her behavior is normal. Thought content normal.  Nursing note and vitals reviewed.   BP (!) 150/92   Pulse (!) 122   Temp 99.2 F (37.3 C) (Oral)   Ht 5\' 1"  (1.549 m)   Wt 173 lb (78.5 kg)   SpO2 97%   BMI 32.69 kg/m   Assessment and Plan: 1. Bronchitis Continue current therapy - levofloxacin (LEVAQUIN) 500 MG tablet; Take 1 tablet (500 mg total) by mouth daily for 10 days.  Dispense: 10 tablet; Refill: 0  2. Injury of muscle of shoulder Heat or ice plus tylenol - cyclobenzaprine (FLEXERIL) 10 MG tablet; Take 1 tablet (10 mg total) by mouth 3 (three) times daily as needed for muscle spasms.  Dispense: 30 tablet; Refill: 0   Meds ordered this encounter  Medications  . levofloxacin (LEVAQUIN) 500 MG tablet    Sig: Take 1 tablet (500 mg total) by mouth daily for 10 days.    Dispense:  10 tablet    Refill:  0  . cyclobenzaprine (FLEXERIL) 10 MG tablet    Sig: Take 1 tablet (10 mg total) by mouth 3 (three) times daily as needed for muscle spasms.    Dispense:  30 tablet    Refill:  0  . metFORMIN (GLUCOPHAGE) 500 MG tablet    Sig: Take 3 tablets (1,500 mg total) by mouth daily. with food    Dispense:  270 tablet    Refill:  3    Partially dictated using Animal nutritionist. Any errors are unintentional.  Bari Edward, MD [facility]  06/03/2017

## 2017-06-18 ENCOUNTER — Other Ambulatory Visit: Payer: Self-pay | Admitting: Internal Medicine

## 2017-06-19 ENCOUNTER — Other Ambulatory Visit: Payer: Self-pay | Admitting: Internal Medicine

## 2017-06-26 ENCOUNTER — Telehealth: Payer: Self-pay

## 2017-06-26 ENCOUNTER — Other Ambulatory Visit: Payer: Self-pay | Admitting: Internal Medicine

## 2017-06-26 MED ORDER — CEFUROXIME AXETIL 500 MG PO TABS: 500.0000 mg | ORAL_TABLET | Freq: Two times a day (BID) | ORAL | 0 refills | Status: AC

## 2017-06-26 NOTE — Telephone Encounter (Signed)
I sent in Ceftin.

## 2017-06-26 NOTE — Telephone Encounter (Signed)
Patient called stating she was seen 3 weeks ago for bronchitis. Still coughing with thick mucous coming up and right ear pain. Wants to know if we can call in another antibiotic for her to try?  Please Advise.

## 2017-06-26 NOTE — Telephone Encounter (Signed)
Patient informed. Will come in after this round of antibiotics if no better.

## 2017-07-09 ENCOUNTER — Ambulatory Visit: Payer: BLUE CROSS/BLUE SHIELD | Admitting: Internal Medicine

## 2017-07-09 ENCOUNTER — Encounter: Payer: Self-pay | Admitting: Internal Medicine

## 2017-07-09 ENCOUNTER — Ambulatory Visit
Admission: RE | Admit: 2017-07-09 | Discharge: 2017-07-09 | Disposition: A | Discharge status: Home / Self Care | Payer: BLUE CROSS/BLUE SHIELD | Source: Ambulatory Visit | Attending: Internal Medicine | Admitting: Internal Medicine

## 2017-07-09 VITALS — BP 152/84 | HR 105 | Temp 98.9°F | Ht 61.0 in | Wt 173.0 lb

## 2017-07-09 DIAGNOSIS — R05 Cough: Secondary | ICD-10-CM | POA: Insufficient documentation

## 2017-07-09 DIAGNOSIS — J4541 Moderate persistent asthma with (acute) exacerbation: Secondary | ICD-10-CM

## 2017-07-09 MED ORDER — CLARITHROMYCIN 500 MG PO TABS: 500.0000 mg | ORAL_TABLET | Freq: Two times a day (BID) | ORAL | 0 refills | Status: AC

## 2017-07-09 MED ORDER — PREDNISONE 10 MG PO TABS: ORAL_TABLET | ORAL | 0 refills | Status: DC

## 2017-07-09 MED ORDER — HYDROCODONE-HOMATROPINE 5-1.5 MG/5ML PO SYRP: 5.0000 mL | ORAL_SOLUTION | Freq: Four times a day (QID) | ORAL | 0 refills | Status: DC | PRN

## 2017-07-09 NOTE — Progress Notes (Signed)
Date:  07/09/2017   Name:  Crystal Rich   DOB:  1966-01-14   MRN:  [id number]   Chief Complaint: Cough (Concerned of pnuemonia. Has had three asthma attack since last visit. Can't hear out of right ear, both are painful. Constantly coughing- only gets production up somtimes and its thick yellow/green. )  Cough  This is a chronic problem. The current episode started more than 1 month ago. The problem has been waxing and waning. The cough is productive of sputum. Associated symptoms include ear pain, shortness of breath and wheezing. Pertinent negatives include no chest pain, chills, fever or sore throat. She has tried steroid inhaler, a beta-agonist inhaler and leukotriene antagonists (amox then levaflox then ceftin) for the symptoms. The treatment provided mild relief.    Review of Systems  Constitutional: Positive for fatigue. Negative for chills, fever and unexpected weight change.  HENT: Positive for ear pain. Negative for sore throat, tinnitus and trouble swallowing.   Respiratory: Positive for cough, shortness of breath and wheezing.   Cardiovascular: Negative for chest pain and palpitations.  Gastrointestinal: Negative for abdominal pain.    Patient Active Problem List   Diagnosis Date Noted  . Hemorrhoids 01/31/2017  . Dysphagia 12/31/2016  . Restless legs syndrome 12/31/2016  . Essential hypertension 12/31/2016  . Lymphadenopathy, axillary 04/25/2015  . Uncontrolled type 2 diabetes mellitus without complication, without long-term current use of insulin (HCC) 04/25/2015  . Insomnia 03/11/2015  . Chronic abdominal pain 11/17/2014  . C. difficile colitis 11/17/2014  . Disorder of esophagus 11/17/2014  . Anxiety, generalized 11/17/2014  . 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 Hot flash, menopausal 11/17/2014  . Migraine without aura and responsive to treatment 11/17/2014  . Asthma, moderate persistent 11/17/2014  . Abdominal pain  04/02/2012    Prior to Admission medications   Medication Sig Start Date End Date Taking? Authorizing Provider  albuterol (PROVENTIL HFA;VENTOLIN HFA) 108 (90 Base) MCG/ACT inhaler USE AS DIRECTED 06/19/17  Yes Reubin Milan, MD  clonazePAM Scarlette Calico) 1 MG tablet  04/01/15  Yes [provider]  escitalopram (LEXAPRO) 20 MG tablet Take 2 tablets by mouth daily at 2 PM daily at 2 PM.    Yes [provider]  EST ESTROGENS-METHYLTEST HS 0.625-1.25 MG tablet TAKE 1 TABLET BY MOUTH EVERY DAY AS NEEDED 04/08/17  Yes Reubin Milan, MD  fluticasone Aleda Grana) 50 MCG/ACT nasal spray  03/22/15  Yes [provider]  hydrOXYzine (ATARAX/VISTARIL) 25 MG tablet Take 2 tablets by mouth at bedtime.  02/24/15  Yes [provider]  losartan (COZAAR) 50 MG tablet TAKE 1 TABLET BY MOUTH EVERY DAY 04/27/17  Yes Reubin Milan, MD  metFORMIN (GLUCOPHAGE) 500 MG tablet Take 3 tablets (1,500 mg total) by mouth daily. with food 06/03/17  Yes Reubin Milan, MD  montelukast (SINGULAIR) 10 MG tablet TAKE 1 TABLET (10 MG TOTAL) BY MOUTH AT BEDTIME. 10/21/16  Yes Reubin Milan, MD  omeprazole (PRILOSEC) 20 MG capsule TAKE 1 CAPSULE (20 MG TOTAL) BY MOUTH 2 (TWO) TIMES DAILY BEFORE A MEAL. 06/19/17  Yes Reubin Milan, MD  pramipexole (MIRAPEX) 1 MG tablet TAKE 1 TABLET (1 MG TOTAL) BY MOUTH AT BEDTIME. 03/22/17  Yes Reubin Milan, MD  pramoxine-hydrocortisone (PROCTOCREAM-HC) 1-1 % rectal cream PLACE 1 APPLICATION RECTALLY 2 (TWO) TIMES DAILY. 05/25/17  Yes Reubin Milan, MD  promethazine (PHENERGAN) 25 MG tablet Take 1 tablet (25 mg total) by mouth 2 (  two) times daily as needed. 05/08/17  Yes Reubin Milan, MD  rizatriptan (MAXALT-MLT) 10 MG disintegrating tablet DISSOLVE 1 TABLET ON TONGUE TWICE A DAY AS NEEDED 04/26/17  Yes Reubin Milan, MD  rOPINIRole (REQUIP) 3 MG tablet Take 1 tablet (3 mg total) by mouth at bedtime. 05/29/17  Yes Reubin Milan, MD    saxagliptin HCl (ONGLYZA) 5 MG TABS tablet Take 1 tablet (5 mg total) by mouth daily. 04/16/17  Yes Reubin Milan, MD  theophylline (UNIPHYL) 400 MG 24 hr tablet TAKE 1 TABLET (400 MG TOTAL) BY MOUTH DAILY. 09/21/16  Yes Reubin Milan, MD  traMADol Janean Sark) 50 MG tablet  03/30/15  Yes [provider]    Allergies  Allergen Reactions  . Nsaids Shortness Of Breath  . Aspirin   . Ketorolac     Other reaction(s): Other (See Comments) Patient states Burning sensation inside Other reaction(s): Other (See Comments) Patient states Burning sensation inside  . Macrolides And Ketolides   . Morphine Sulfate Nausea And Vomiting  . Nalbuphine     Other reaction(s): Other (See Comments) Burning on inside  . Prochlorperazine Edisylate Other (See Comments)    tachycardia  . Sulfa Antibiotics   . Trazodone Cough  . Bupropion Rash  . Oxybutynin Rash    Past Surgical History:  Procedure Laterality Date  . ABDOMINAL HYSTERECTOMY  1999  . APPENDECTOMY    . BILATERAL SALPINGOOPHORECTOMY  2010  . CHOLECYSTECTOMY  2005  . COLONOSCOPY  2016    Social History   Tobacco Use  . Smoking status: Never Smoker  . Smokeless tobacco: Never Used  Substance Use Topics  . Alcohol use: No    Alcohol/week: 0.0 oz  . Drug use: No     Medication list has been reviewed and updated.  PHQ 2/9 Scores 05/08/2017 12/31/2016  PHQ - 2 Score 0 1  PHQ- 9 Score 0 2    Physical Exam  Constitutional: She is oriented to person, place, and time. She appears well-developed. No distress.  HENT:  Head: Normocephalic and atraumatic.  Right Ear: Ear canal normal. Tympanic membrane is bulging. Tympanic membrane is not erythematous.  Left Ear: Ear canal normal. Tympanic membrane is bulging. Tympanic membrane is not erythematous.  Nose: Right sinus exhibits no maxillary sinus tenderness and no frontal sinus tenderness. Left sinus exhibits no maxillary sinus tenderness and no frontal sinus tenderness.   Mouth/Throat: Posterior oropharyngeal erythema present.  Cardiovascular: Normal rate, regular rhythm and normal heart sounds.  Pulmonary/Chest: Effort normal. No respiratory distress. She has decreased breath sounds. She has no wheezes.  Musculoskeletal: Normal range of motion.  Neurological: She is alert and oriented to person, place, and time.  Skin: Skin is warm and dry. No rash noted.  Psychiatric: She has a normal mood and affect. Her speech is normal and behavior is normal. Thought content normal.  Nursing note and vitals reviewed.   BP (!) 152/84   Pulse (!) 105   Temp 98.9 F (37.2 C) (Oral)   Ht 5\' 1"  (1.549 m)   Wt 173 lb (78.5 kg)   SpO2 98%   BMI 32.69 kg/m   Assessment and Plan: 1. Moderate persistent asthma with acute exacerbation Add Symbicort 4.5/160 - one puff twice a day Continue Albuterol - predniSONE (DELTASONE) 10 MG tablet; Take 6 on day 1, 5 on day 2, 4 on day 3, 3 on day 4, 2 on day 5 and 1 on day 1 then stop.  Dispense:  21 tablet; Refill: 0 - clarithromycin (BIAXIN) 500 MG tablet; Take 1 tablet (500 mg total) by mouth 2 (two) times daily for 10 days.  Dispense: 20 tablet; Refill: 0 - HYDROcodone-homatropine (HYCODAN) 5-1.5 MG/5ML syrup; Take 5 mLs by mouth every 6 (six) hours as needed for cough.  Dispense: 120 mL; Refill: 0 - DG Chest 2 View; Future   Meds ordered this encounter  Medications  . predniSONE (DELTASONE) 10 MG tablet    Sig: Take 6 on day 1, 5 on day 2, 4 on day 3, 3 on day 4, 2 on day 5 and 1 on day 1 then stop.    Dispense:  21 tablet    Refill:  0  . clarithromycin (BIAXIN) 500 MG tablet    Sig: Take 1 tablet (500 mg total) by mouth 2 (two) times daily for 10 days.    Dispense:  20 tablet    Refill:  0  . HYDROcodone-homatropine (HYCODAN) 5-1.5 MG/5ML syrup    Sig: Take 5 mLs by mouth every 6 (six) hours as needed for cough.    Dispense:  120 mL    Refill:  0    Partially dictated using Animal nutritionistDragon software. Any errors are  unintentional.  Bari EdwardLaura Berglund, MD [facility]  07/09/2017

## 2017-07-15 ENCOUNTER — Other Ambulatory Visit: Payer: Self-pay

## 2017-07-15 ENCOUNTER — Other Ambulatory Visit: Payer: Self-pay | Admitting: Internal Medicine

## 2017-07-15 DIAGNOSIS — G2581 Restless legs syndrome: Secondary | ICD-10-CM

## 2017-07-15 MED ORDER — ROPINIROLE HCL 1 MG PO TABS: 1.0000 mg | ORAL_TABLET | Freq: Every day | ORAL | 1 refills | Status: DC

## 2017-07-16 ENCOUNTER — Other Ambulatory Visit: Payer: Self-pay

## 2017-07-16 ENCOUNTER — Telehealth: Payer: Self-pay

## 2017-07-16 DIAGNOSIS — T3695XA Adverse effect of unspecified systemic antibiotic, initial encounter: Principal | ICD-10-CM

## 2017-07-16 DIAGNOSIS — B379 Candidiasis, unspecified: Secondary | ICD-10-CM

## 2017-07-16 MED ORDER — FLUCONAZOLE 150 MG PO TABS: 150.0000 mg | ORAL_TABLET | Freq: Once | ORAL | 0 refills | Status: DC

## 2017-07-16 NOTE — Telephone Encounter (Signed)
Patient has been on 4 different Abx and has severe thrush. Can not eat or taste anything. Asking for Dr.Jones to call in Diflucan or another medication that will work. Please call her back [phone].

## 2017-07-16 NOTE — Telephone Encounter (Signed)
Sent in Diflucan to [facility]

## 2017-08-02 ENCOUNTER — Other Ambulatory Visit: Payer: Self-pay | Admitting: Internal Medicine

## 2017-08-14 ENCOUNTER — Other Ambulatory Visit: Payer: Self-pay | Admitting: Family Medicine

## 2017-08-14 ENCOUNTER — Other Ambulatory Visit: Payer: Self-pay | Admitting: Internal Medicine

## 2017-08-14 DIAGNOSIS — S46909A Unspecified injury of unspecified muscle, fascia and tendon at shoulder and upper arm level, unspecified arm, initial encounter: Secondary | ICD-10-CM

## 2017-08-14 MED ORDER — CYCLOBENZAPRINE HCL 10 MG PO TABS: 10.0000 mg | ORAL_TABLET | Freq: Three times a day (TID) | ORAL | 0 refills | Status: DC | PRN

## 2017-08-14 NOTE — Telephone Encounter (Signed)
Spoke  With Hovnanian EnterprisesChristian at  [place]   Medication   Request  Sent to wrong office  Should  Have  Gone  To DR   [place]  Office   She  Will route the  Request

## 2017-08-21 ENCOUNTER — Other Ambulatory Visit: Payer: Self-pay | Admitting: Internal Medicine

## 2017-08-25 ENCOUNTER — Other Ambulatory Visit: Payer: Self-pay | Admitting: Internal Medicine

## 2017-09-06 ENCOUNTER — Ambulatory Visit: Payer: Self-pay | Admitting: Internal Medicine

## 2017-09-12 ENCOUNTER — Other Ambulatory Visit: Payer: Self-pay | Admitting: Internal Medicine

## 2017-09-30 ENCOUNTER — Other Ambulatory Visit: Payer: Self-pay | Admitting: Internal Medicine

## 2017-10-06 ENCOUNTER — Other Ambulatory Visit: Payer: Self-pay | Admitting: Internal Medicine

## 2017-10-06 DIAGNOSIS — G2581 Restless legs syndrome: Secondary | ICD-10-CM

## 2017-10-08 ENCOUNTER — Other Ambulatory Visit: Payer: Self-pay | Admitting: Internal Medicine

## 2017-10-14 ENCOUNTER — Other Ambulatory Visit: Payer: Self-pay | Admitting: Internal Medicine

## 2017-10-15 ENCOUNTER — Other Ambulatory Visit: Payer: Self-pay | Admitting: Internal Medicine

## 2017-10-15 DIAGNOSIS — S46909A Unspecified injury of unspecified muscle, fascia and tendon at shoulder and upper arm level, unspecified arm, initial encounter: Secondary | ICD-10-CM

## 2017-11-04 ENCOUNTER — Other Ambulatory Visit: Payer: Self-pay | Admitting: Internal Medicine

## 2017-11-13 ENCOUNTER — Ambulatory Visit: Payer: Self-pay | Admitting: Internal Medicine

## 2017-11-15 ENCOUNTER — Ambulatory Visit: Payer: BLUE CROSS/BLUE SHIELD | Admitting: Internal Medicine

## 2017-11-15 ENCOUNTER — Encounter: Payer: Self-pay | Admitting: Internal Medicine

## 2017-11-15 VITALS — BP 120/86 | HR 102 | Temp 98.8°F | Resp 16 | Ht 61.0 in | Wt 159.0 lb

## 2017-11-15 DIAGNOSIS — J4540 Moderate persistent asthma, uncomplicated: Secondary | ICD-10-CM | POA: Diagnosis not present

## 2017-11-15 DIAGNOSIS — J0100 Acute maxillary sinusitis, unspecified: Secondary | ICD-10-CM

## 2017-11-15 MED ORDER — AMOXICILLIN 875 MG PO TABS: 875.0000 mg | ORAL_TABLET | Freq: Two times a day (BID) | ORAL | 0 refills | Status: AC

## 2017-11-15 MED ORDER — BENZONATATE 200 MG PO CAPS: 200.0000 mg | ORAL_CAPSULE | Freq: Three times a day (TID) | ORAL | 0 refills | Status: DC | PRN

## 2017-11-15 NOTE — Progress Notes (Signed)
Date:  11/15/2017   Name:  Crystal Rich   DOB:  08-04-1965   MRN:  [id number]   Chief Complaint: URI; Otalgia; and Cough Otalgia   There is pain in the right ear. The current episode started in the past 7 days. The problem occurs constantly. The problem has been unchanged. There has been no fever. Associated symptoms include coughing, neck pain and a sore throat. Pertinent negatives include no abdominal pain or headaches. She has tried acetaminophen for the symptoms.  Cough  This is a new problem. The current episode started in the past 7 days. The problem has been gradually improving. The problem occurs every few minutes. The cough is non-productive. Associated symptoms include ear pain, a sore throat and wheezing. Pertinent negatives include no chest pain, chills, fever or headaches.     Review of Systems  Constitutional: Negative for chills, fatigue and fever.  HENT: Positive for ear pain and sore throat.   Respiratory: Positive for cough, chest tightness and wheezing.   Cardiovascular: Negative for chest pain and palpitations.  Gastrointestinal: Negative for abdominal pain.  Musculoskeletal: Positive for neck pain.  Neurological: Negative for dizziness and headaches.    Patient Active Problem List   Diagnosis Date Noted  . Hemorrhoids 01/31/2017  . Dysphagia 12/31/2016  . Restless legs syndrome 12/31/2016  . Essential hypertension 12/31/2016  . Lymphadenopathy, axillary 04/25/2015  . Uncontrolled type 2 diabetes mellitus without complication, without long-term current use of insulin (HCC) 04/25/2015  . Insomnia 03/11/2015  . Chronic abdominal pain 11/17/2014  . C. difficile colitis 11/17/2014  . Disorder of esophagus 11/17/2014  . Anxiety, generalized 11/17/2014  . 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 Hot flash, menopausal 11/17/2014  . Migraine without aura and responsive to treatment 11/17/2014  . Asthma, moderate persistent  11/17/2014  . Abdominal pain 04/02/2012    Prior to Admission medications   Medication Sig Start Date End Date Taking? Authorizing Provider  albuterol (PROVENTIL HFA;VENTOLIN HFA) 108 (90 Base) MCG/ACT inhaler USE AS DIRECTED 06/19/17  Yes Reubin Milan, MD  benzonatate (TESSALON) 200 MG capsule TAKE ONE CAPSULE BY MOUTH EVERY 8 HOURS AS NEEDED COUGH 11/09/17  Yes [provider]  buPROPion (WELLBUTRIN) 75 MG tablet TAKE 1 TABLET BY MOUTH EVERY DAY IN THE MORNING 07/22/17  Yes [provider]  clonazePAM (KLONOPIN) 1 MG tablet  04/01/15  Yes [provider]  cyclobenzaprine (FLEXERIL) 10 MG tablet TAKE 1 TABLET BY MOUTH THREE TIMES A DAY AS NEEDED FOR MUSCLE SPASMS 10/15/17  Yes Reubin Milan, MD  dicyclomine (BENTYL) 10 MG capsule TAKE 1 CAPSULE BY MOUTH 3 TIMES A DAY BEFORE MEALS 07/27/17  Yes [provider]  escitalopram (LEXAPRO) 20 MG tablet Take 2 tablets by mouth daily at 2 PM daily at 2 PM.    Yes [provider]  EST ESTROGENS-METHYLTEST HS 0.625-1.25 MG tablet TAKE 1 TABLET BY MOUTH EVERY DAY AS NEEDED 10/14/17  Yes Reubin Milan, MD  fluticasone Aleda Grana) 50 MCG/ACT nasal spray  03/22/15  Yes [provider]  hydrOXYzine (ATARAX/VISTARIL) 25 MG tablet Take 2 tablets by mouth at bedtime.  02/24/15  Yes [provider]  losartan (COZAAR) 50 MG tablet TAKE 1 TABLET BY MOUTH EVERY DAY 04/27/17  Yes Reubin Milan, MD  metFORMIN (GLUCOPHAGE) 500 MG tablet Take 3 tablets (1,500 mg total) by mouth daily. with food 06/03/17  Yes Reubin Milan, MD  montelukast (SINGULAIR) 10 MG tablet  TAKE 1 TABLET (10 MG TOTAL) BY MOUTH AT BEDTIME. 10/01/17  Yes Reubin Milan, MD  omeprazole (PRILOSEC) 20 MG capsule TAKE 1 CAPSULE (20 MG TOTAL) BY MOUTH 2 (TWO) TIMES DAILY BEFORE A MEAL. 10/08/17  Yes Reubin Milan, MD  ONGLYZA 5 MG TABS tablet TAKE 1 TABLET BY MOUTH EVERY DAY 10/14/17  Yes Reubin Milan, MD  pramipexole  (MIRAPEX) 1 MG tablet TAKE 1 TABLET (1 MG TOTAL) BY MOUTH AT BEDTIME. 10/07/17  Yes Reubin Milan, MD  pramoxine-hydrocortisone (PROCTOCREAM-HC) 1-1 % rectal cream PLACE 1 APPLICATION RECTALLY 2 (TWO) TIMES DAILY. 05/25/17  Yes Reubin Milan, MD  predniSONE (DELTASONE) 10 MG tablet Take 6 on day 1, 5 on day 2, 4 on day 3, 3 on day 4, 2 on day 5 and 1 on day 1 then stop. 07/09/17  Yes Reubin Milan, MD  promethazine (PHENERGAN) 25 MG tablet TAKE 1 TABLET (25 MG TOTAL) BY MOUTH 2 (TWO) TIMES DAILY AS NEEDED. 11/04/17  Yes Reubin Milan, MD  rizatriptan (MAXALT-MLT) 10 MG disintegrating tablet DISSOLVE 1 TABLET ON TONGUE TWICE A DAY AS NEEDED 09/13/17  Yes Reubin Milan, MD  theophylline (UNIPHYL) 400 MG 24 hr tablet TAKE 1 TABLET (400 MG TOTAL) BY MOUTH DAILY. 08/02/17  Yes Reubin Milan, MD  traMADol Janean Sark) 50 MG tablet  03/30/15  Yes [provider]    Allergies  Allergen Reactions  . Nsaids Shortness Of Breath  . Azithromycin Rash  . Sulfa Antibiotics Rash  . Aspirin   . Ketorolac     Other reaction(s): Other (See Comments) Patient states Burning sensation inside Other reaction(s): Other (See Comments) Patient states Burning sensation inside  . Morphine Sulfate Nausea And Vomiting  . Nalbuphine     Other reaction(s): Other (See Comments) Burning on inside  . Prochlorperazine Edisylate Other (See Comments)    tachycardia  . Trazodone Cough  . Bupropion Rash  . Oxybutynin Rash    Past Surgical History:  Procedure Laterality Date  . ABDOMINAL HYSTERECTOMY  1999  . APPENDECTOMY    . BILATERAL SALPINGOOPHORECTOMY  2010  . CHOLECYSTECTOMY  2005  . COLONOSCOPY  2016    Social History   Tobacco Use  . Smoking status: Never Smoker  . Smokeless tobacco: Never Used  Substance Use Topics  . Alcohol use: No    Alcohol/week: 0.0 oz  . Drug use: No     Medication list has been reviewed and updated.  Current Meds  Medication Sig  . albuterol  (PROVENTIL HFA;VENTOLIN HFA) 108 (90 Base) MCG/ACT inhaler USE AS DIRECTED  . benzonatate (TESSALON) 200 MG capsule TAKE ONE CAPSULE BY MOUTH EVERY 8 HOURS AS NEEDED COUGH  . buPROPion (WELLBUTRIN) 75 MG tablet TAKE 1 TABLET BY MOUTH EVERY DAY IN THE MORNING  . clonazePAM (KLONOPIN) 1 MG tablet   . cyclobenzaprine (FLEXERIL) 10 MG tablet TAKE 1 TABLET BY MOUTH THREE TIMES A DAY AS NEEDED FOR MUSCLE SPASMS  . dicyclomine (BENTYL) 10 MG capsule TAKE 1 CAPSULE BY MOUTH 3 TIMES A DAY BEFORE MEALS  . escitalopram (LEXAPRO) 20 MG tablet Take 2 tablets by mouth daily at 2 PM daily at 2 PM.   . EST ESTROGENS-METHYLTEST HS 0.625-1.25 MG tablet TAKE 1 TABLET BY MOUTH EVERY DAY AS NEEDED  . fluticasone (FLONASE) 50 MCG/ACT nasal spray   . hydrOXYzine (ATARAX/VISTARIL) 25 MG tablet Take 2 tablets by mouth at bedtime.   Marland Kitchen losartan (COZAAR) 50 MG tablet TAKE  1 TABLET BY MOUTH EVERY DAY  . metFORMIN (GLUCOPHAGE) 500 MG tablet Take 3 tablets (1,500 mg total) by mouth daily. with food  . montelukast (SINGULAIR) 10 MG tablet TAKE 1 TABLET (10 MG TOTAL) BY MOUTH AT BEDTIME.  Marland Kitchen. omeprazole (PRILOSEC) 20 MG capsule TAKE 1 CAPSULE (20 MG TOTAL) BY MOUTH 2 (TWO) TIMES DAILY BEFORE A MEAL.  Marland Kitchen. ONGLYZA 5 MG TABS tablet TAKE 1 TABLET BY MOUTH EVERY DAY  . pramipexole (MIRAPEX) 1 MG tablet TAKE 1 TABLET (1 MG TOTAL) BY MOUTH AT BEDTIME.  . pramoxine-hydrocortisone (PROCTOCREAM-HC) 1-1 % rectal cream PLACE 1 APPLICATION RECTALLY 2 (TWO) TIMES DAILY.  Marland Kitchen. predniSONE (DELTASONE) 10 MG tablet Take 6 on day 1, 5 on day 2, 4 on day 3, 3 on day 4, 2 on day 5 and 1 on day 1 then stop.  . promethazine (PHENERGAN) 25 MG tablet TAKE 1 TABLET (25 MG TOTAL) BY MOUTH 2 (TWO) TIMES DAILY AS NEEDED.  . rizatriptan (MAXALT-MLT) 10 MG disintegrating tablet DISSOLVE 1 TABLET ON TONGUE TWICE A DAY AS NEEDED  . theophylline (UNIPHYL) 400 MG 24 hr tablet TAKE 1 TABLET (400 MG TOTAL) BY MOUTH DAILY.  . traMADol (ULTRAM) 50 MG tablet   .  [DISCONTINUED] albuterol (PROVENTIL HFA) 108 (90 Base) MCG/ACT inhaler USE AS DIRECTED  . [DISCONTINUED] HYDROcodone-homatropine (HYCODAN) 5-1.5 MG/5ML syrup Take 5 mLs by mouth every 6 (six) hours as needed for cough.  . [DISCONTINUED] pramipexole (MIRAPEX) 1 MG tablet TAKE 1 TABLET (1 MG TOTAL) BY MOUTH AT BEDTIME.    PHQ 2/9 Scores 11/15/2017 05/08/2017 12/31/2016  PHQ - 2 Score 0 0 1  PHQ- 9 Score - 0 2    Physical Exam  Constitutional: She is oriented to person, place, and time. She appears well-developed and well-nourished.  HENT:  Right Ear: External ear and ear canal normal. Tympanic membrane is retracted. Tympanic membrane is not erythematous.  Left Ear: External ear and ear canal normal. Tympanic membrane is retracted. Tympanic membrane is not erythematous.  Nose: Right sinus exhibits maxillary sinus tenderness. Right sinus exhibits no frontal sinus tenderness. Left sinus exhibits maxillary sinus tenderness. Left sinus exhibits no frontal sinus tenderness.  Mouth/Throat: Uvula is midline and mucous membranes are normal. No oral lesions. Posterior oropharyngeal erythema present. No oropharyngeal exudate.  Cardiovascular: Normal rate, regular rhythm and normal heart sounds.  Pulmonary/Chest: Breath sounds normal. She has no wheezes. She has no rales.  Lymphadenopathy:    She has no cervical adenopathy.  Neurological: She is alert and oriented to person, place, and time.    BP 120/86   Pulse (!) 102   Temp 98.8 F (37.1 C) (Oral)   Resp 16   Ht 5\' 1"  (1.549 m)   Wt 159 lb (72.1 kg)   SpO2 98%   BMI 30.04 kg/m   Assessment and Plan: 1. Acute non-recurrent maxillary sinusitis Continue Flonase - amoxicillin (AMOXIL) 875 MG tablet; Take 1 tablet (875 mg total) by mouth 2 (two) times daily for 10 days.  Dispense: 20 tablet; Refill: 0  2. Moderate persistent asthma, unspecified whether complicated Continue inhalers Tessalon for cough   Meds ordered this encounter    Medications  . amoxicillin (AMOXIL) 875 MG tablet    Sig: Take 1 tablet (875 mg total) by mouth 2 (two) times daily for 10 days.    Dispense:  20 tablet    Refill:  0  . benzonatate (TESSALON) 200 MG capsule    Sig: Take 1 capsule (200  mg total) by mouth 3 (three) times daily as needed for cough.    Dispense:  30 capsule    Refill:  0    Partially dictated using Animal nutritionist. Any errors are unintentional.  Bari Edward, MD [facility]  11/15/2017   There are no diagnoses linked to this encounter.

## 2017-11-21 ENCOUNTER — Other Ambulatory Visit: Payer: Self-pay | Admitting: Internal Medicine

## 2017-11-21 DIAGNOSIS — G2581 Restless legs syndrome: Secondary | ICD-10-CM

## 2017-12-12 ENCOUNTER — Other Ambulatory Visit: Payer: Self-pay

## 2017-12-12 MED ORDER — RIZATRIPTAN BENZOATE 10 MG PO TBDP: ORAL_TABLET | ORAL | 4 refills | Status: DC

## 2017-12-13 ENCOUNTER — Other Ambulatory Visit: Payer: Self-pay | Admitting: Internal Medicine

## 2017-12-19 ENCOUNTER — Other Ambulatory Visit: Payer: Self-pay

## 2017-12-19 DIAGNOSIS — K649 Unspecified hemorrhoids: Secondary | ICD-10-CM

## 2017-12-19 MED ORDER — HYDROCORTISONE ACE-PRAMOXINE 1-1 % RE CREA: 1.0000 "application " | TOPICAL_CREAM | Freq: Two times a day (BID) | RECTAL | 0 refills | Status: DC

## 2017-12-24 DIAGNOSIS — K219 Gastro-esophageal reflux disease without esophagitis: Secondary | ICD-10-CM | POA: Insufficient documentation

## 2018-01-08 ENCOUNTER — Other Ambulatory Visit: Payer: Self-pay

## 2018-01-08 MED ORDER — METFORMIN HCL 500 MG PO TABS: 1500.0000 mg | ORAL_TABLET | Freq: Every day | ORAL | 1 refills | Status: DC

## 2018-01-14 ENCOUNTER — Other Ambulatory Visit: Payer: Self-pay | Admitting: Internal Medicine

## 2018-01-29 ENCOUNTER — Other Ambulatory Visit: Payer: Self-pay | Admitting: Internal Medicine

## 2018-02-01 ENCOUNTER — Other Ambulatory Visit: Payer: Self-pay | Admitting: Internal Medicine

## 2018-02-22 ENCOUNTER — Other Ambulatory Visit: Payer: Self-pay | Admitting: Internal Medicine

## 2018-02-22 DIAGNOSIS — G2581 Restless legs syndrome: Secondary | ICD-10-CM

## 2018-02-28 ENCOUNTER — Other Ambulatory Visit: Payer: Self-pay

## 2018-02-28 MED ORDER — FLUCONAZOLE 150 MG PO TABS: 150.0000 mg | ORAL_TABLET | Freq: Once | ORAL | 0 refills | Status: DC

## 2018-02-28 NOTE — Progress Notes (Signed)
Patient called stating she is currently having a yeast infection. In the past she has had this and we sent in diflucan. Sent diflucan to her pharmacy. Informed patient if it does not get better then we will need to see her for OV. She verbalized understanding.

## 2018-03-04 ENCOUNTER — Other Ambulatory Visit: Payer: Self-pay | Admitting: Internal Medicine

## 2018-03-06 ENCOUNTER — Other Ambulatory Visit: Payer: Self-pay | Admitting: Internal Medicine

## 2018-03-06 DIAGNOSIS — G2581 Restless legs syndrome: Secondary | ICD-10-CM

## 2018-03-16 ENCOUNTER — Other Ambulatory Visit: Payer: Self-pay | Admitting: Internal Medicine

## 2018-03-16 DIAGNOSIS — S46909A Unspecified injury of unspecified muscle, fascia and tendon at shoulder and upper arm level, unspecified arm, initial encounter: Secondary | ICD-10-CM

## 2018-03-30 ENCOUNTER — Other Ambulatory Visit: Payer: Self-pay | Admitting: Internal Medicine

## 2018-04-02 ENCOUNTER — Other Ambulatory Visit: Payer: Self-pay

## 2018-04-02 MED ORDER — METFORMIN HCL 500 MG PO TABS: 1500.0000 mg | ORAL_TABLET | Freq: Every day | ORAL | 0 refills | Status: DC

## 2018-04-16 ENCOUNTER — Telehealth: Payer: Self-pay

## 2018-04-16 NOTE — Telephone Encounter (Signed)
Patient called stating she was calling to cancel her appt today. She stated on VM she is taking care of her mother right now because she fell and broke several bones.  Called patient back and informed her she does not have an appt today but she does need to schedule a follow up for DM in the next 30 days. Told her we have not done a follow up for DM in almost a year. She verbalized understanding. Offered to transfer her to the front to schedule her follow up for a few weeks out from today but she declined and said she will call back to schedule this. Informed her to be sure to call back soon to schedule that follow up otherwise Dr Judithann GravesBerglund can't continue to send in her med refills.   She verbalized understanding and said she will call back.

## 2018-05-17 ENCOUNTER — Other Ambulatory Visit: Payer: Self-pay | Admitting: Internal Medicine

## 2018-05-18 ENCOUNTER — Other Ambulatory Visit: Payer: Self-pay | Admitting: Internal Medicine

## 2018-05-18 DIAGNOSIS — G2581 Restless legs syndrome: Secondary | ICD-10-CM

## 2018-05-18 MED ORDER — EST ESTROGENS-METHYLTEST 0.625-1.25 MG PO TABS: 1.0000 | ORAL_TABLET | Freq: Every day | ORAL | 0 refills | Status: DC | PRN

## 2018-05-19 NOTE — Telephone Encounter (Signed)
Voicemail is not set up to leave a message-[id number]

## 2018-06-06 ENCOUNTER — Other Ambulatory Visit: Payer: Self-pay | Admitting: Internal Medicine

## 2018-06-06 DIAGNOSIS — S46909A Unspecified injury of unspecified muscle, fascia and tendon at shoulder and upper arm level, unspecified arm, initial encounter: Secondary | ICD-10-CM

## 2018-06-10 ENCOUNTER — Other Ambulatory Visit: Payer: Self-pay | Admitting: Internal Medicine

## 2018-06-10 DIAGNOSIS — S46909A Unspecified injury of unspecified muscle, fascia and tendon at shoulder and upper arm level, unspecified arm, initial encounter: Secondary | ICD-10-CM

## 2018-06-12 ENCOUNTER — Other Ambulatory Visit: Payer: Self-pay | Admitting: Internal Medicine

## 2018-06-21 ENCOUNTER — Other Ambulatory Visit: Payer: Self-pay | Admitting: Internal Medicine

## 2018-06-23 NOTE — Telephone Encounter (Signed)
Could not LM to schedule appointment.

## 2018-07-10 ENCOUNTER — Other Ambulatory Visit: Payer: Self-pay | Admitting: Internal Medicine

## 2018-07-11 ENCOUNTER — Other Ambulatory Visit: Payer: Self-pay | Admitting: Internal Medicine

## 2018-08-05 ENCOUNTER — Other Ambulatory Visit: Payer: Self-pay | Admitting: Internal Medicine

## 2018-08-05 NOTE — Telephone Encounter (Signed)
Called patient and will CB to schedule appt. She said she is watching her grand kids.

## 2018-08-08 ENCOUNTER — Other Ambulatory Visit: Payer: Self-pay | Admitting: Internal Medicine

## 2018-08-08 DIAGNOSIS — G2581 Restless legs syndrome: Secondary | ICD-10-CM

## 2018-08-11 NOTE — Telephone Encounter (Signed)
Informed patient she said she will call back and schedule her appt.

## 2018-08-11 NOTE — Telephone Encounter (Signed)
Tried having her schedule now while on the phone and she said she will need to call back to find out when she can come.

## 2018-08-18 ENCOUNTER — Telehealth: Payer: Self-pay

## 2018-08-18 ENCOUNTER — Other Ambulatory Visit: Payer: Self-pay | Admitting: Internal Medicine

## 2018-08-18 DIAGNOSIS — E118 Type 2 diabetes mellitus with unspecified complications: Secondary | ICD-10-CM

## 2018-08-18 DIAGNOSIS — IMO0001 Reserved for inherently not codable concepts without codable children: Secondary | ICD-10-CM

## 2018-08-18 DIAGNOSIS — E1165 Type 2 diabetes mellitus with hyperglycemia: Principal | ICD-10-CM

## 2018-08-18 MED ORDER — METFORMIN HCL 500 MG PO TABS: 1500.0000 mg | ORAL_TABLET | Freq: Every day | ORAL | 0 refills | Status: DC

## 2018-08-18 NOTE — Telephone Encounter (Signed)
Never mind.  She really needs an in-person visit.  I refilled her metformin.

## 2018-08-18 NOTE — Telephone Encounter (Signed)
Yes, I will need to do a video visit.

## 2018-08-18 NOTE — Telephone Encounter (Signed)
Patient called leaving Vm stating she knows she needs an appt but she is not going out of the house right now due to COVID. She is afraid of catching the virus and bringing it home to her family. Said she is getting ready to run out of metformin.  Can we do a video visit and then see her in a few months? I can ask her if so.  Lovett Calender, CMA

## 2018-08-20 ENCOUNTER — Ambulatory Visit: Payer: Self-pay | Admitting: Internal Medicine

## 2018-08-26 ENCOUNTER — Other Ambulatory Visit: Payer: Self-pay | Admitting: Internal Medicine

## 2018-08-31 ENCOUNTER — Other Ambulatory Visit: Payer: Self-pay | Admitting: Internal Medicine

## 2018-08-31 DIAGNOSIS — G2581 Restless legs syndrome: Secondary | ICD-10-CM

## 2018-09-01 ENCOUNTER — Other Ambulatory Visit: Payer: Self-pay | Admitting: Internal Medicine

## 2018-09-08 ENCOUNTER — Other Ambulatory Visit: Payer: Self-pay | Admitting: Internal Medicine

## 2018-09-12 ENCOUNTER — Other Ambulatory Visit: Payer: Self-pay | Admitting: Internal Medicine

## 2018-09-16 ENCOUNTER — Other Ambulatory Visit: Payer: Self-pay | Admitting: Internal Medicine

## 2018-09-17 ENCOUNTER — Other Ambulatory Visit: Payer: Self-pay | Admitting: Internal Medicine

## 2018-09-17 ENCOUNTER — Other Ambulatory Visit: Payer: Self-pay

## 2018-09-18 ENCOUNTER — Other Ambulatory Visit: Payer: Self-pay

## 2018-09-18 MED ORDER — PROMETHAZINE HCL 25 MG PO TABS: 25.0000 mg | ORAL_TABLET | Freq: Two times a day (BID) | ORAL | 0 refills | Status: DC | PRN

## 2018-09-24 ENCOUNTER — Other Ambulatory Visit: Payer: Self-pay | Admitting: Internal Medicine

## 2018-10-09 ENCOUNTER — Other Ambulatory Visit: Payer: Self-pay | Admitting: Internal Medicine

## 2018-10-09 DIAGNOSIS — S46909A Unspecified injury of unspecified muscle, fascia and tendon at shoulder and upper arm level, unspecified arm, initial encounter: Secondary | ICD-10-CM

## 2018-10-20 ENCOUNTER — Other Ambulatory Visit: Payer: Self-pay | Admitting: Internal Medicine

## 2018-10-27 ENCOUNTER — Encounter: Payer: Self-pay | Admitting: Internal Medicine

## 2018-10-27 ENCOUNTER — Other Ambulatory Visit: Payer: Self-pay

## 2018-10-27 ENCOUNTER — Ambulatory Visit: Payer: BLUE CROSS/BLUE SHIELD | Admitting: Internal Medicine

## 2018-10-27 VITALS — BP 140/78 | HR 90 | Ht 61.0 in | Wt 158.0 lb

## 2018-10-27 DIAGNOSIS — G2581 Restless legs syndrome: Secondary | ICD-10-CM

## 2018-10-27 DIAGNOSIS — R0981 Nasal congestion: Secondary | ICD-10-CM

## 2018-10-27 DIAGNOSIS — I10 Essential (primary) hypertension: Secondary | ICD-10-CM | POA: Diagnosis not present

## 2018-10-27 DIAGNOSIS — Z1231 Encounter for screening mammogram for malignant neoplasm of breast: Secondary | ICD-10-CM

## 2018-10-27 DIAGNOSIS — E118 Type 2 diabetes mellitus with unspecified complications: Secondary | ICD-10-CM | POA: Diagnosis not present

## 2018-10-27 DIAGNOSIS — F3341 Major depressive disorder, recurrent, in partial remission: Secondary | ICD-10-CM

## 2018-10-27 NOTE — Patient Instructions (Signed)
Schedule Diabetic Eye exam  Plan to get Pneumonia vaccine at your next visit if conditions allow.

## 2018-10-27 NOTE — Progress Notes (Signed)
Date:  10/27/2018   Name:  Crystal Rich   DOB:  29-Jan-1966   MRN:  [id number]   Chief Complaint: Diabetes (Follow up. BS- 200. Foot exam. ), Hypertension, Restless Leg, and Sinusitis (X 2 weeks. Pressure in face. BS are higher due to infection. )  Diabetes She presents for her follow-up diabetic visit. She has type 2 diabetes mellitus. Pertinent negatives for hypoglycemia include no dizziness, headaches, nervousness/anxiousness or tremors. Pertinent negatives for diabetes include no chest pain, no fatigue, no polydipsia and no polyuria. Symptoms are stable. Current diabetic treatment includes oral agent (dual therapy) (metformin and onlgyza). She is compliant with treatment all of the time. She is following a generally healthy diet. She monitors blood glucose at home 3-4 x per week. Her breakfast blood glucose is taken between 6-7 am. Her breakfast blood glucose range is generally 110-130 mg/dl. An ACE inhibitor/angiotensin II receptor blocker is being taken. She does not see a podiatrist.Eye exam is not current.  Hypertension This is a chronic problem. The problem is controlled. Pertinent negatives include no chest pain, headaches, palpitations or shortness of breath. Risk factors for coronary artery disease include diabetes mellitus. Past treatments include angiotensin blockers. The current treatment provides significant improvement.  Sinusitis The problem has been gradually improving since onset. There has been no fever. Associated symptoms include sinus pressure. Pertinent negatives include no chills, coughing, headaches, shortness of breath or sore throat. Past treatments include saline sprays and spray decongestants. The treatment provided moderate relief.  Depression        This is a chronic (followed by Psych at [facility]) problem.The problem is unchanged.  Associated symptoms include no fatigue, no appetite change and no headaches.     The symptoms are aggravated by social  issues (mainly issues related to Covid-19).  Past treatments include SSRIs - Selective serotonin reuptake inhibitors and other medications.  Compliance with treatment is good. RLS - controlled, actually seems better since reducing dose of Lexapro to 20 mg.  Still taking medication nightly and sleeping well.  Review of Systems  Constitutional: Negative for appetite change, chills, fatigue, fever and unexpected weight change.  HENT: Positive for sinus pressure. Negative for sore throat, tinnitus and trouble swallowing.   Eyes: Negative for visual disturbance.  Respiratory: Negative for cough, chest tightness and shortness of breath.   Cardiovascular: Negative for chest pain, palpitations and leg swelling.  Gastrointestinal: Positive for vomiting (daily due to intestinal issues). Negative for abdominal pain, constipation and diarrhea.  Endocrine: Negative for polydipsia and polyuria.  Genitourinary: Negative for dysuria and hematuria.  Musculoskeletal: Negative for arthralgias.  Skin: Negative for wound.  Neurological: Negative for dizziness, tremors, numbness and headaches.  Hematological: Negative for adenopathy.  Psychiatric/Behavioral: Positive for depression and dysphoric mood. Negative for sleep disturbance. The patient is not nervous/anxious.     Patient Active Problem List   Diagnosis Date Noted  . Gastroesophageal reflux disease without esophagitis 12/24/2017  . Hemorrhoids 01/31/2017  . Dysphagia 12/31/2016  . Restless legs syndrome 12/31/2016  . Essential hypertension 12/31/2016  . Lymphadenopathy, axillary 04/25/2015  . Type II diabetes mellitus with complication ([facility]) 10/31/9483  . Insomnia 03/11/2015  . Chronic abdominal pain 11/17/2014  . C. difficile colitis 11/17/2014  . Disorder of esophagus 11/17/2014  . Anxiety, generalized 11/17/2014  . Depression, major, recurrent, in partial remission ([place]) 11/17/2014  . Hot flash, menopausal 11/17/2014  . Migraine without aura  and responsive to treatment 11/17/2014  . Asthma, moderate persistent 11/17/2014  .  Abdominal pain 04/02/2012    Allergies  Allergen Reactions  . Nsaids Shortness Of Breath  . Azithromycin Rash  . Sulfa Antibiotics Rash  . Aspirin   . Ketorolac     Other reaction(s): Other (See Comments) Patient states Burning sensation inside Other reaction(s): Other (See Comments) Patient states Burning sensation inside  . Morphine Sulfate Nausea And Vomiting  . Nalbuphine     Other reaction(s): Other (See Comments) Burning on inside  . Prochlorperazine Edisylate Other (See Comments)    tachycardia  . Trazodone Cough  . Bupropion Rash  . Oxybutynin Rash    Past Surgical History:  Procedure Laterality Date  . ABDOMINAL HYSTERECTOMY  1999  . APPENDECTOMY    . BILATERAL SALPINGOOPHORECTOMY  2010  . CHOLECYSTECTOMY  2005  . COLONOSCOPY  2016    Social History   Tobacco Use  . Smoking status: Never Smoker  . Smokeless tobacco: Never Used  Substance Use Topics  . Alcohol use: No    Alcohol/week: 0.0 standard drinks  . Drug use: No     Medication list has been reviewed and updated.  Current Meds  Medication Sig  . albuterol (VENTOLIN HFA) 108 (90 Base) MCG/ACT inhaler USE AS DIRECTED  . buPROPion (WELLBUTRIN) 75 MG tablet TAKE 1 TABLET BY MOUTH EVERY DAY IN THE MORNING  . clonazePAM (KLONOPIN) 1 MG tablet 2 (two) times daily as needed.   . cyclobenzaprine (FLEXERIL) 10 MG tablet TAKE 1 TABLET BY MOUTH THREE TIMES A DAY AS NEEDED FOR MUSCLE SPASMS  . escitalopram (LEXAPRO) 20 MG tablet Take 1 tablet by mouth daily.   . fluticasone (FLONASE) 50 MCG/ACT nasal spray   . hydrOXYzine (ATARAX/VISTARIL) 25 MG tablet Take 2 tablets by mouth every 8 (eight) hours as needed.   Marland Kitchen. losartan (COZAAR) 50 MG tablet TAKE 1 TABLET BY MOUTH EVERY DAY  . metFORMIN (GLUCOPHAGE) 500 MG tablet Take 3 tablets (1,500 mg total) by mouth daily. with food  . montelukast (SINGULAIR) 10 MG tablet TAKE 1  TABLET BY MOUTH EVERYDAY AT BEDTIME  . omeprazole (PRILOSEC) 20 MG capsule TAKE 1 CAPSULE (20 MG TOTAL) BY MOUTH 2 (TWO) TIMES DAILY BEFORE A MEAL.  Marland Kitchen. ONGLYZA 5 MG TABS tablet TAKE 1 TABLET BY MOUTH EVERY DAY  . pramipexole (MIRAPEX) 1 MG tablet TAKE 1 TABLET (1 MG TOTAL) BY MOUTH AT BEDTIME.  . promethazine (PHENERGAN) 25 MG tablet Take 1 tablet (25 mg total) by mouth 2 (two) times daily as needed.  . rizatriptan (MAXALT-MLT) 10 MG disintegrating tablet DISSOLVE 1 TABLET ON TONGUE TWICE A DAY AS NEEDED  . rOPINIRole (REQUIP) 3 MG tablet TAKE 1 TABLET (3 MG TOTAL) BY MOUTH AT BEDTIME.  . theophylline (UNIPHYL) 400 MG 24 hr tablet TAKE 1 TABLET (400 MG TOTAL) BY MOUTH DAILY.  . traMADol (ULTRAM) 50 MG tablet Take 50 mg by mouth every 6 (six) hours as needed.     PHQ 2/9 Scores 10/27/2018 11/15/2017 05/08/2017 12/31/2016  PHQ - 2 Score 1 0 0 1  PHQ- 9 Score 10 - 0 2    BP Readings from Last 3 Encounters:  10/27/18 140/78  11/15/17 120/86  07/09/17 (!) 152/84    Physical Exam Vitals signs and nursing note reviewed.  Constitutional:      General: She is not in acute distress.    Appearance: She is well-developed.  HENT:     Head: Normocephalic and atraumatic.  Neck:     Musculoskeletal: Normal range of motion.  Vascular: No carotid bruit.  Cardiovascular:     Rate and Rhythm: Normal rate and regular rhythm.     Heart sounds: No murmur.  Pulmonary:     Effort: Pulmonary effort is normal. No respiratory distress.     Breath sounds: No wheezing or rhonchi.  Abdominal:     General: Bowel sounds are decreased.     Palpations: Abdomen is soft.  Musculoskeletal: Normal range of motion.     Right lower leg: No edema.     Left lower leg: No edema.  Lymphadenopathy:     Cervical: No cervical adenopathy.  Skin:    General: Skin is warm and dry.     Capillary Refill: Capillary refill takes less than 2 seconds.     Findings: No rash.  Neurological:     Mental Status: She is alert and  oriented to person, place, and time.     Sensory: No sensory deficit.  Psychiatric:        Attention and Perception: Attention normal.        Mood and Affect: Mood normal.        Behavior: Behavior normal.        Thought Content: Thought content normal.      Wt Readings from Last 3 Encounters:  10/27/18 158 lb (71.7 kg)  11/15/17 159 lb (72.1 kg)  07/09/17 173 lb (78.5 kg)    BP 140/78   Pulse 90   Ht 5\' 1"  (1.549 m)   Wt 158 lb (71.7 kg)   SpO2 97%   BMI 29.85 kg/m   Assessment and Plan: 1. Type II diabetes mellitus with complication (HCC) Continue current therapy Will need PPV-23 next visit Schedule DM eye exam - Comprehensive metabolic panel - Hemoglobin A1c - Lipid panel - TSH  2. Essential hypertension controlled - CBC with Differential/Platelet  3. Depression, major, recurrent, in partial remission (HCC) Doing fair - followed by Psych  4. Restless legs syndrome controlled - CBC with Differential/Platelet  5. Encounter for screening mammogram for breast cancer To be scheduled at [facility] - MM 3D SCREEN BREAST BILATERAL; Future  6. Sinus congestion Continue flonase Call for antibiotics if purulent sinus discharge recurs   Partially dictated using Dragon software. Any errors are unintentional.  Bari EdwardLaura Javonnie Illescas, MD [facility]  10/27/2018

## 2018-10-28 LAB — CBC WITH DIFFERENTIAL/PLATELET
Basophils Absolute: 0 10*3/uL (ref 0.0–0.2)
Basos: 1 %
EOS (ABSOLUTE): 0 10*3/uL (ref 0.0–0.4)
Eos: 0 %
Hematocrit: 40.4 % (ref 34.0–46.6)
Hemoglobin: 13.5 g/dL (ref 11.1–15.9)
Immature Grans (Abs): 0 10*3/uL (ref 0.0–0.1)
Immature Granulocytes: 0 %
Lymphocytes Absolute: 1.7 10*3/uL (ref 0.7–3.1)
Lymphs: 24 %
MCH: 27.8 pg (ref 26.6–33.0)
MCHC: 33.4 g/dL (ref 31.5–35.7)
MCV: 83 fL (ref 79–97)
Monocytes Absolute: 0.5 10*3/uL (ref 0.1–0.9)
Monocytes: 7 %
Neutrophils Absolute: 5 10*3/uL (ref 1.4–7.0)
Neutrophils: 68 %
Platelets: 279 10*3/uL (ref 150–450)
RBC: 4.86 x10E6/uL (ref 3.77–5.28)
RDW: 13.3 % (ref 11.7–15.4)
WBC: 7.2 10*3/uL (ref 3.4–10.8)

## 2018-10-28 LAB — COMPREHENSIVE METABOLIC PANEL
ALT: 29 IU/L (ref 0–32)
AST: 29 IU/L (ref 0–40)
Albumin/Globulin Ratio: 1.8 (ref 1.2–2.2)
Albumin: 4.5 g/dL (ref 3.8–4.9)
Alkaline Phosphatase: 120 IU/L — ABNORMAL HIGH (ref 39–117)
BUN/Creatinine Ratio: 11 (ref 9–23)
BUN: 7 mg/dL (ref 6–24)
Bilirubin Total: 0.2 mg/dL (ref 0.0–1.2)
CO2: 24 mmol/L (ref 20–29)
Calcium: 9.6 mg/dL (ref 8.7–10.2)
Chloride: 97 mmol/L (ref 96–106)
Creatinine, Ser: 0.61 mg/dL (ref 0.57–1.00)
GFR calc Af Amer: 121 mL/min/{1.73_m2} (ref >59)
GFR calc non Af Amer: 105 mL/min/{1.73_m2} (ref >59)
Globulin, Total: 2.5 g/dL (ref 1.5–4.5)
Glucose: 183 mg/dL — ABNORMAL HIGH (ref 65–99)
Potassium: 4.2 mmol/L (ref 3.5–5.2)
Sodium: 139 mmol/L (ref 134–144)
Total Protein: 7 g/dL (ref 6.0–8.5)

## 2018-10-28 LAB — TSH: TSH: 2.43 u[IU]/mL (ref 0.450–4.500)

## 2018-10-28 LAB — LIPID PANEL
Chol/HDL Ratio: 3.7 ratio (ref 0.0–4.4)
Cholesterol, Total: 152 mg/dL (ref 100–199)
HDL: 41 mg/dL (ref >39)
LDL Calculated: 54 mg/dL (ref 0–99)
Triglycerides: 287 mg/dL — ABNORMAL HIGH (ref 0–149)
VLDL Cholesterol Cal: 57 mg/dL — ABNORMAL HIGH (ref 5–40)

## 2018-10-28 LAB — HEMOGLOBIN A1C
Est. average glucose Bld gHb Est-mCnc: 194 mg/dL
Hgb A1c MFr Bld: 8.4 % — ABNORMAL HIGH (ref 4.8–5.6)

## 2018-10-29 ENCOUNTER — Other Ambulatory Visit: Payer: Self-pay | Admitting: Internal Medicine

## 2018-10-29 DIAGNOSIS — E118 Type 2 diabetes mellitus with unspecified complications: Secondary | ICD-10-CM

## 2018-10-29 MED ORDER — JARDIANCE 25 MG PO TABS: 25.0000 mg | ORAL_TABLET | Freq: Every day | ORAL | 5 refills | Status: DC

## 2018-11-13 ENCOUNTER — Other Ambulatory Visit: Payer: Self-pay | Admitting: Internal Medicine

## 2018-11-27 ENCOUNTER — Other Ambulatory Visit: Payer: Self-pay | Admitting: Internal Medicine

## 2018-11-27 DIAGNOSIS — IMO0001 Reserved for inherently not codable concepts without codable children: Secondary | ICD-10-CM

## 2018-12-04 IMAGING — CR DG CHEST 2V
3 series · 3 of 3 positions shown · non-contrast
Comparison: None.

CLINICAL DATA: Cough, congestion and mid chest pain

EXAM:
CHEST - 2 VIEW

[chest lat (1 of 2)]
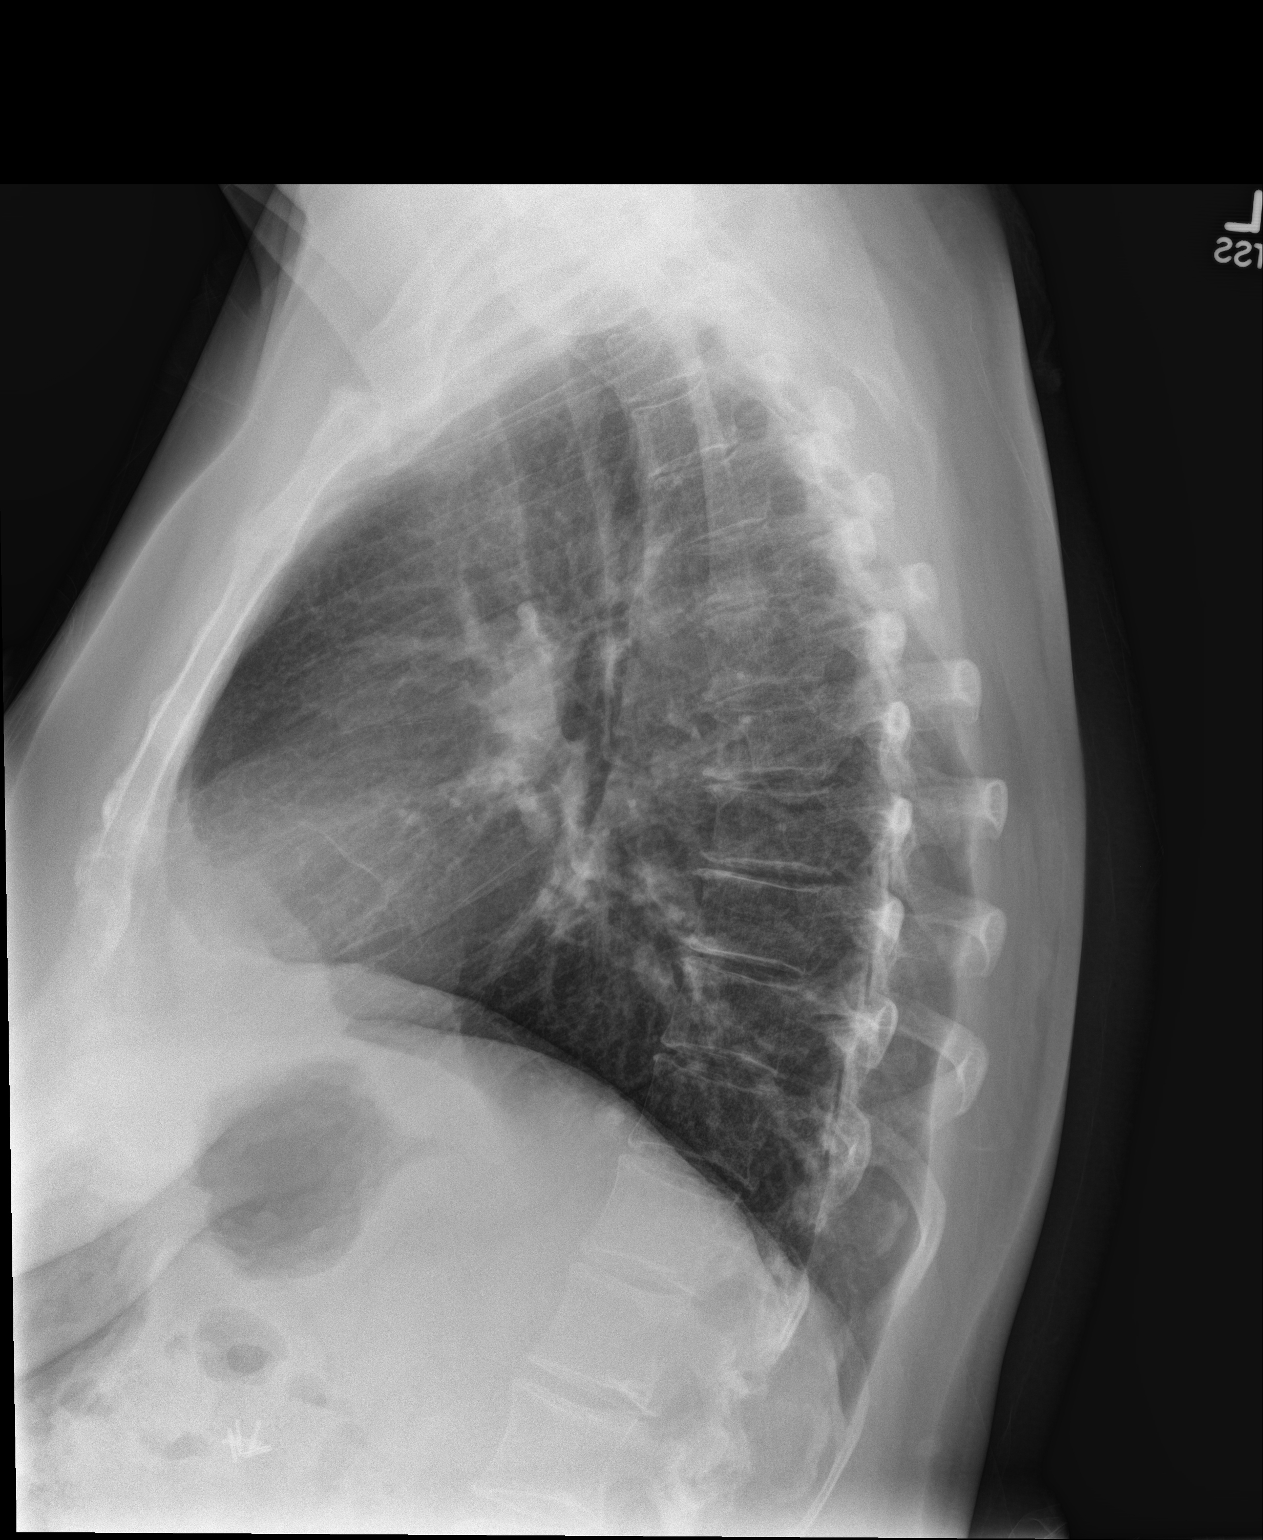

[chest pa]
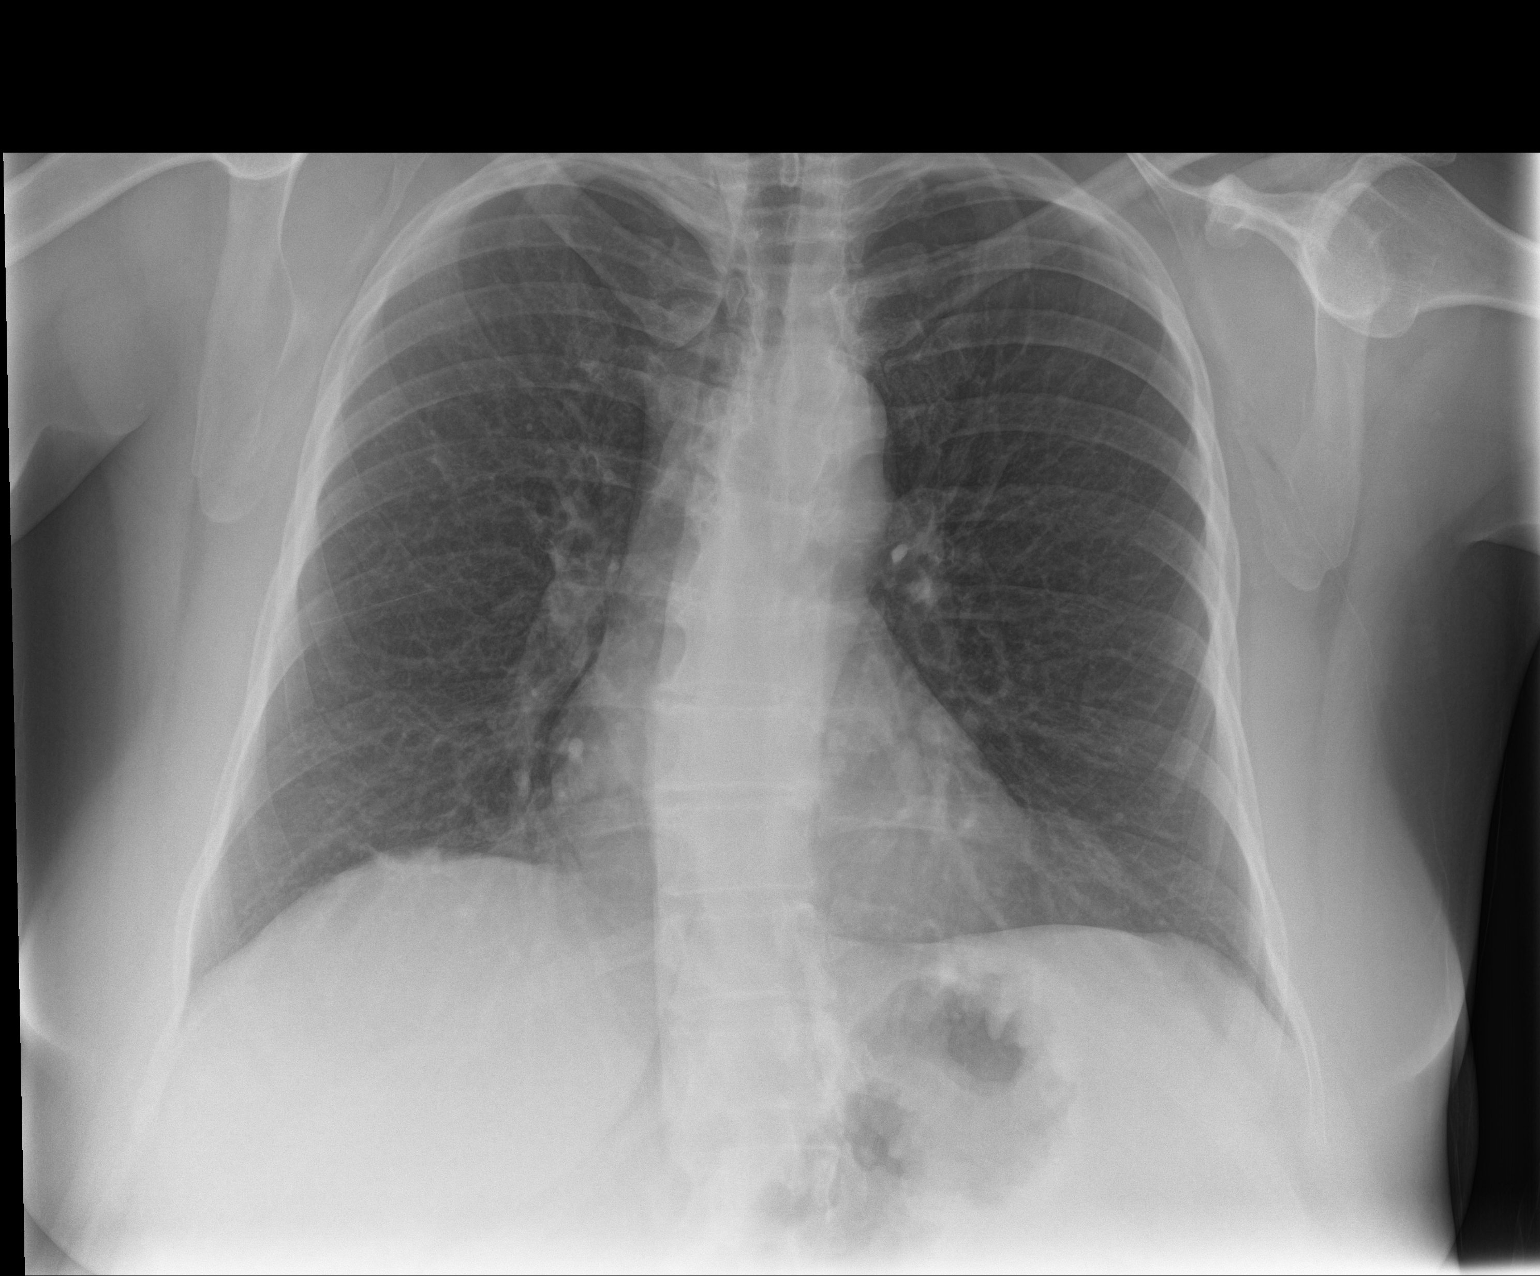

[chest lat (2 of 2)]
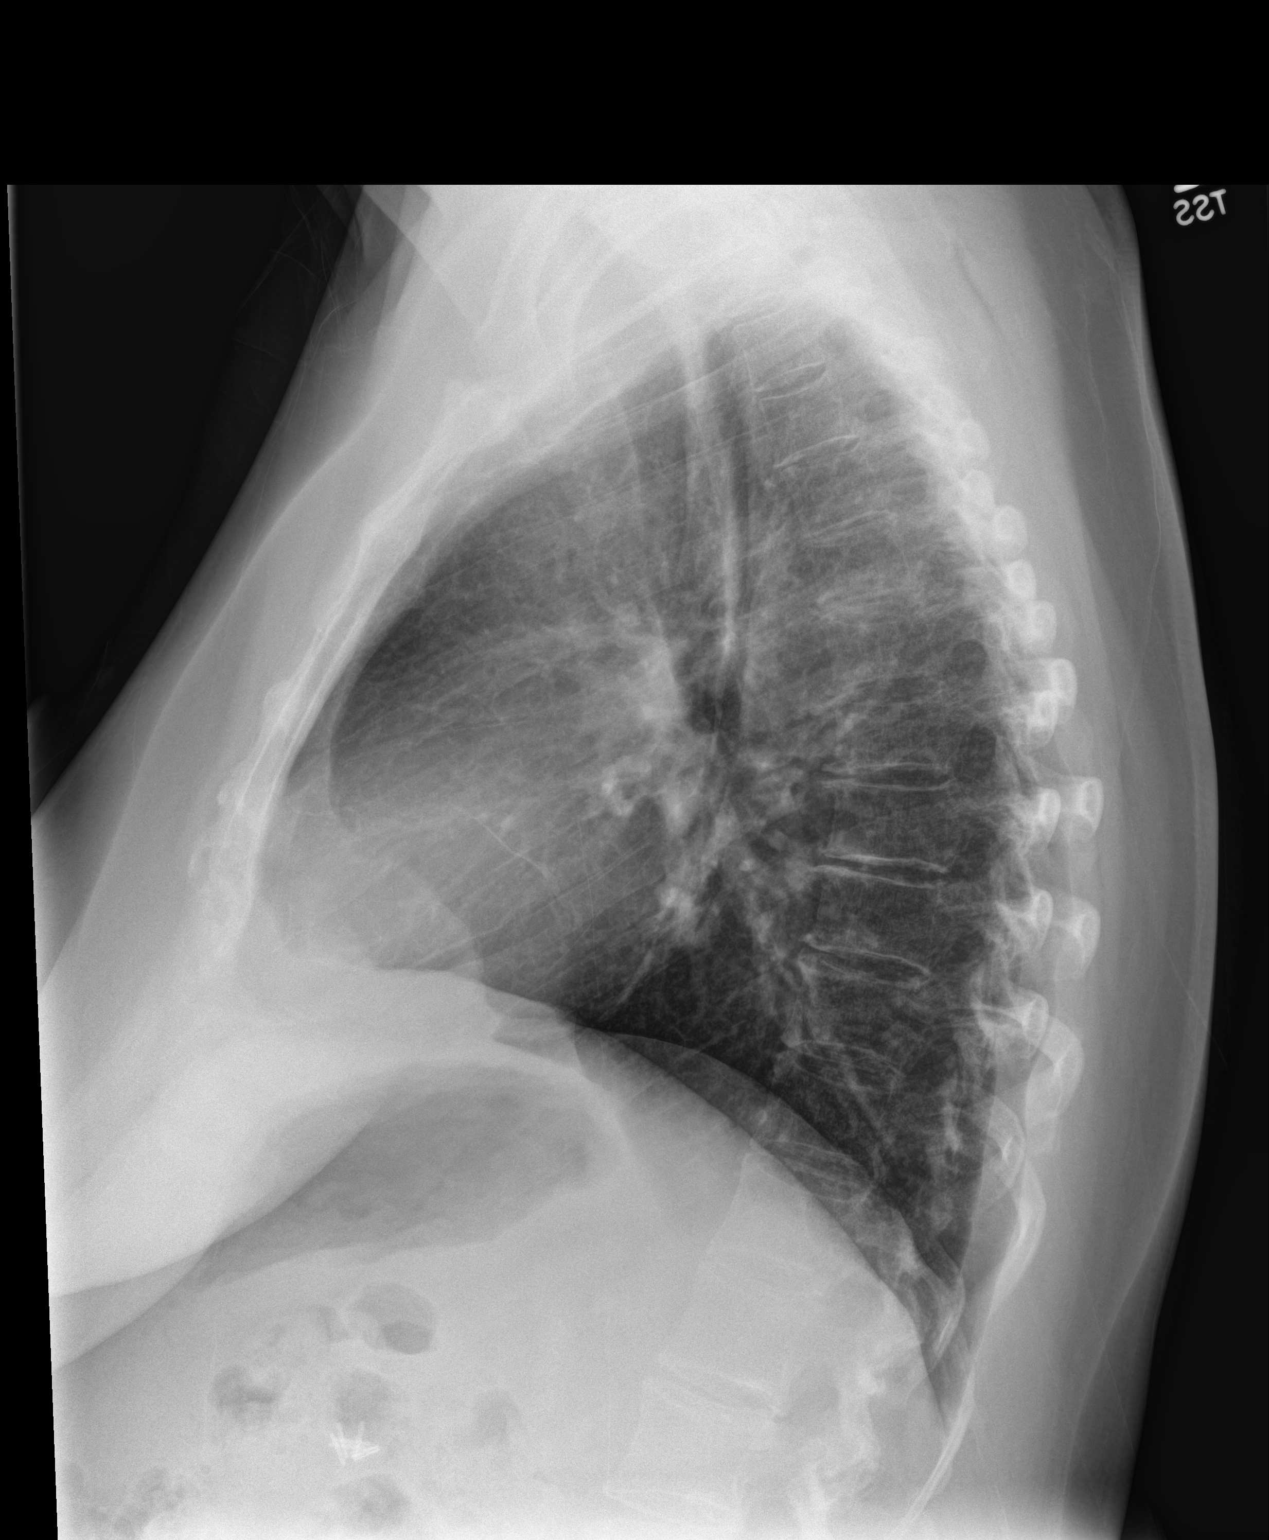

[3 of 3 positions shown; findings below may reference images not displayed]

FINDINGS: The heart size and mediastinal contours are within normal limits.
Both lungs are clear. Minimal atelectasis is seen at the right lung
base. Mild degenerative change along the dorsal spine. No acute nor
suspicious osseous abnormality.
IMPRESSION: No active cardiopulmonary disease.

## 2018-12-16 ENCOUNTER — Other Ambulatory Visit: Payer: Self-pay | Admitting: Internal Medicine

## 2018-12-16 DIAGNOSIS — S46909A Unspecified injury of unspecified muscle, fascia and tendon at shoulder and upper arm level, unspecified arm, initial encounter: Secondary | ICD-10-CM

## 2019-01-01 ENCOUNTER — Encounter: Payer: Self-pay | Admitting: Internal Medicine

## 2019-01-01 ENCOUNTER — Ambulatory Visit (INDEPENDENT_AMBULATORY_CARE_PROVIDER_SITE_OTHER): Payer: Self-pay | Admitting: Internal Medicine

## 2019-01-01 ENCOUNTER — Other Ambulatory Visit: Payer: Self-pay

## 2019-01-01 VITALS — BP 133/79 | Temp 100.3°F | Ht 61.0 in | Wt 158.0 lb

## 2019-01-01 DIAGNOSIS — Z20822 Contact with and (suspected) exposure to covid-19: Secondary | ICD-10-CM

## 2019-01-01 DIAGNOSIS — R6889 Other general symptoms and signs: Secondary | ICD-10-CM

## 2019-01-01 DIAGNOSIS — J0100 Acute maxillary sinusitis, unspecified: Secondary | ICD-10-CM

## 2019-01-01 MED ORDER — AMOXICILLIN-POT CLAVULANATE 875-125 MG PO TABS: 1.0000 | ORAL_TABLET | Freq: Two times a day (BID) | ORAL | 0 refills | Status: AC

## 2019-01-01 MED ORDER — FLUCONAZOLE 100 MG PO TABS: 100.0000 mg | ORAL_TABLET | Freq: Every day | ORAL | 0 refills | Status: DC

## 2019-01-01 NOTE — Progress Notes (Signed)
Date:  01/01/2019   Name:  Crystal CableCarolyn D Borges   DOB:  Oct 07, 1965   MRN:  [id number]  I connected with this patient, Crystal Rich, by telephone at the patient's home.  I verified that I am speaking with the correct person using two identifiers. This visit was conducted via telephone due to the Covid-19 outbreak from my office at [facility] in [place], [place]. I discussed the limitations, risks, security and privacy concerns of performing an evaluation and management service by telephone. I also discussed with the patient that there may be a patient responsible charge related to this service. The patient expressed understanding and agreed to proceed.  Chief Complaint: Cough (Cough, body aches, fever, and congestion in face. Chest hurting from coughing. Started yesterday. )  Cough This is a new problem. The current episode started yesterday. The cough is non-productive. Associated symptoms include chills, ear pain, a fever, myalgias, nasal congestion, a sore throat and shortness of breath. Pertinent negatives include no chest pain, headaches or wheezing. Associated symptoms comments: Loss of taste. The symptoms are aggravated by exercise. She has tried a beta-agonist inhaler for the symptoms. Her past medical history is significant for asthma.  Sinus Problem This is a new problem. The current episode started today. Associated symptoms include chills, coughing, ear pain, a hoarse voice, shortness of breath, sinus pressure and a sore throat. Pertinent negatives include no headaches.  She has no exposure to Covid-19.  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  She has had sinus infections in the past.  She also has IBS and sometimes get diarrhea.  Review of Systems  Constitutional: Positive for chills and fever.  HENT: Positive for ear pain, hoarse voice, sinus pressure and sore throat. Negative for trouble swallowing.   Eyes: Negative for visual disturbance.  Respiratory: Positive for  cough, chest tightness and shortness of breath. Negative for wheezing.   Cardiovascular: Negative for chest pain, palpitations and leg swelling.  Gastrointestinal: Negative for blood in stool and vomiting.  Genitourinary: Negative for difficulty urinating.  Musculoskeletal: Positive for myalgias.  Neurological: Negative for dizziness, light-headedness and headaches.    Patient Active Problem List   Diagnosis Date Noted  . Gastroesophageal reflux disease without esophagitis 12/24/2017  . Hemorrhoids 01/31/2017  . Dysphagia 12/31/2016  . Restless legs syndrome 12/31/2016  . Essential hypertension 12/31/2016  . Lymphadenopathy, axillary 04/25/2015  . Type II diabetes mellitus with complication (HCC) 04/25/2015  . Insomnia 03/11/2015  . Chronic abdominal pain 11/17/2014  . C. difficile colitis 11/17/2014  . Disorder of esophagus 11/17/2014  . Anxiety, generalized 11/17/2014  . 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 Hot flash, menopausal 11/17/2014  . Migraine without aura and responsive to treatment 11/17/2014  . Asthma, moderate persistent 11/17/2014  . Abdominal pain 04/02/2012    Allergies  Allergen Reactions  . Nsaids Shortness Of Breath  . Azithromycin Rash  . Sulfa Antibiotics Rash  . Aspirin   . Ketorolac     Other reaction(s): Other (See Comments) Patient states Burning sensation inside Other reaction(s): Other (See Comments) Patient states Burning sensation inside  . Morphine Sulfate Nausea And Vomiting  . Nalbuphine     Other reaction(s): Other (See Comments) Burning on inside  . Prochlorperazine Edisylate Other (See Comments)    tachycardia  . Trazodone Cough  . Bupropion Rash  . Oxybutynin Rash    Past Surgical History:  Procedure Laterality Date  . ABDOMINAL HYSTERECTOMY  1999  .  APPENDECTOMY    . BILATERAL SALPINGOOPHORECTOMY  2010  . CHOLECYSTECTOMY  2005  . COLONOSCOPY  2016    Social History   Tobacco Use  .  Smoking status: Never Smoker  . Smokeless tobacco: Never Used  Substance Use Topics  . Alcohol use: No    Alcohol/week: 0.0 standard drinks  . Drug use: No     Medication list has been reviewed and updated.  Current Meds  Medication Sig  . albuterol (VENTOLIN HFA) 108 (90 Base) MCG/ACT inhaler USE AS DIRECTED  . buPROPion (WELLBUTRIN) 75 MG tablet TAKE 1 TABLET BY MOUTH EVERY DAY IN THE MORNING  . clonazePAM (KLONOPIN) 1 MG tablet 2 (two) times daily as needed.   . cyclobenzaprine (FLEXERIL) 10 MG tablet TAKE 1 TABLET BY MOUTH THREE TIMES A DAY AS NEEDED FOR MUSCLE SPASMS  . diltiazem (CARDIZEM) 60 MG tablet   . empagliflozin (JARDIANCE) 25 MG TABS tablet Take 25 mg by mouth daily.  Marland Kitchen escitalopram (LEXAPRO) 20 MG tablet Take 1 tablet by mouth daily.   Marland Kitchen EST ESTROGENS-METHYLTEST HS 0.625-1.25 MG tablet TAKE 1 TABLET BY MOUTH EVERY DAY AS NEEDED  . fluticasone (FLONASE) 50 MCG/ACT nasal spray   . hydrOXYzine (ATARAX/VISTARIL) 25 MG tablet Take 2 tablets by mouth every 8 (eight) hours as needed.   Marland Kitchen losartan (COZAAR) 50 MG tablet TAKE 1 TABLET BY MOUTH EVERY DAY  . metFORMIN (GLUCOPHAGE) 500 MG tablet TAKE 3 TABLETS (1,500 MG TOTAL) BY MOUTH DAILY. WITH FOOD  . montelukast (SINGULAIR) 10 MG tablet TAKE 1 TABLET BY MOUTH EVERYDAY AT BEDTIME  . omeprazole (PRILOSEC) 20 MG capsule TAKE 1 CAPSULE (20 MG TOTAL) BY MOUTH 2 (TWO) TIMES DAILY BEFORE A MEAL.  Marland Kitchen ONGLYZA 5 MG TABS tablet TAKE 1 TABLET BY MOUTH EVERY DAY  . pramipexole (MIRAPEX) 1 MG tablet TAKE 1 TABLET (1 MG TOTAL) BY MOUTH AT BEDTIME.  . promethazine (PHENERGAN) 25 MG tablet Take 1 tablet (25 mg total) by mouth 2 (two) times daily as needed.  . rizatriptan (MAXALT-MLT) 10 MG disintegrating tablet DISSOLVE 1 TABLET ON TONGUE TWICE A DAY AS NEEDED  . rOPINIRole (REQUIP) 3 MG tablet TAKE 1 TABLET (3 MG TOTAL) BY MOUTH AT BEDTIME.  . theophylline (UNIPHYL) 400 MG 24 hr tablet TAKE 1 TABLET (400 MG TOTAL) BY MOUTH DAILY.  . traMADol  (ULTRAM) 50 MG tablet Take 50 mg by mouth every 6 (six) hours as needed.     PHQ 2/9 Scores 01/01/2019 10/27/2018 11/15/2017 05/08/2017  PHQ - 2 Score 1 1 0 0  PHQ- 9 Score 4 10 - 0    BP Readings from Last 3 Encounters:  01/01/19 133/79  10/27/18 140/78  11/15/17 120/86    Physical Exam Pulmonary:     Effort: Pulmonary effort is normal.     Comments: No cough or dyspnea noted during the call Voice is hoarse and has a nasal quality Neurological:     Mental Status: She is alert.  Psychiatric:        Attention and Perception: Attention normal.        Mood and Affect: Mood normal.     Wt Readings from Last 3 Encounters:  01/01/19 158 lb (71.7 kg)  10/27/18 158 lb (71.7 kg)  11/15/17 159 lb (72.1 kg)    BP 133/79   Temp 100.3 F (37.9 C) (Oral)   Ht 5\' 1"  (1.549 m)   Wt 158 lb (71.7 kg)   BMI 29.85 kg/m   Assessment and  Plan: 1. Acute non-recurrent maxillary sinusitis Symptoms most likely due to sinusitis and otitis which she is at risk for Resume Flonase nasal spray, increase fluids Will prescribe Augmentin to cover sinus and ear in a diabetic Fluconazole if needed for yeast vaginitis - amoxicillin-clavulanate (AUGMENTIN) 875-125 MG tablet; Take 1 tablet by mouth 2 (two) times daily for 10 days.  Dispense: 20 tablet; Refill: 0  2. Suspected Covid-19 Virus Infection Fever is of concern; loss of taste can also be from acute sinus infection Take tylenol every 6 hours for fever and myalgias Recommend that pt find a testing site near her home in [place], [place] home on isolation until fever resolves or test returns negative  I spent 15 minutes on the encounter. Partially dictated using Editor, commissioning. Any errors are unintentional.  Halina Maidens, MD [facility]  01/01/2019

## 2019-01-13 ENCOUNTER — Other Ambulatory Visit: Payer: Self-pay

## 2019-01-13 DIAGNOSIS — E118 Type 2 diabetes mellitus with unspecified complications: Secondary | ICD-10-CM

## 2019-01-13 DIAGNOSIS — IMO0001 Reserved for inherently not codable concepts without codable children: Secondary | ICD-10-CM

## 2019-01-13 DIAGNOSIS — G2581 Restless legs syndrome: Secondary | ICD-10-CM

## 2019-01-13 MED ORDER — ALBUTEROL SULFATE HFA 108 (90 BASE) MCG/ACT IN AERS: 2.0000 | INHALATION_SPRAY | RESPIRATORY_TRACT | 1 refills | Status: DC

## 2019-01-13 MED ORDER — SAXAGLIPTIN HCL 5 MG PO TABS: 5.0000 mg | ORAL_TABLET | Freq: Every day | ORAL | 0 refills | Status: DC

## 2019-01-13 MED ORDER — THEOPHYLLINE ER 400 MG PO TB24: 400.0000 mg | ORAL_TABLET | Freq: Every day | ORAL | 0 refills | Status: DC

## 2019-01-13 MED ORDER — RIZATRIPTAN BENZOATE 10 MG PO TBDP: ORAL_TABLET | ORAL | 2 refills | Status: DC

## 2019-01-13 MED ORDER — ROPINIROLE HCL 3 MG PO TABS: 3.0000 mg | ORAL_TABLET | Freq: Every day | ORAL | 0 refills | Status: DC

## 2019-01-13 MED ORDER — PRAMIPEXOLE DIHYDROCHLORIDE 1 MG PO TABS: 1.0000 mg | ORAL_TABLET | Freq: Every day | ORAL | 0 refills | Status: DC

## 2019-01-13 MED ORDER — JARDIANCE 25 MG PO TABS: 25.0000 mg | ORAL_TABLET | Freq: Every day | ORAL | 5 refills | Status: DC

## 2019-01-13 MED ORDER — MONTELUKAST SODIUM 10 MG PO TABS: ORAL_TABLET | ORAL | 0 refills | Status: DC

## 2019-01-13 MED ORDER — OMEPRAZOLE 20 MG PO CPDR: 20.0000 mg | DELAYED_RELEASE_CAPSULE | Freq: Two times a day (BID) | ORAL | 0 refills | Status: DC

## 2019-01-13 MED ORDER — METFORMIN HCL 500 MG PO TABS: 1500.0000 mg | ORAL_TABLET | Freq: Every day | ORAL | 1 refills | Status: DC

## 2019-01-29 ENCOUNTER — Telehealth: Payer: Self-pay

## 2019-01-29 NOTE — Telephone Encounter (Signed)
Pt called saying she tried to get her mammogram scheduled for this year with [facility] and they told her they do not take her new [facility]. She wants to know where she needs to go to get her mammo done this year, and can we send in a order.  She lives in [place], [place]. Did you want me to place a order for [place] or [facility] and just let her know they will need her previous mammo from [facility]?  Please advise.

## 2019-01-29 NOTE — Telephone Encounter (Signed)
She should probably call her insurance to see where she can go. I've never heard of a [employer] plan that couldn't go to [facility]. otherwise we can order at [facility]

## 2019-02-03 NOTE — Telephone Encounter (Signed)
Patient informed. Will call back with covered imaging centers per insurance plan.

## 2019-02-16 ENCOUNTER — Ambulatory Visit (INDEPENDENT_AMBULATORY_CARE_PROVIDER_SITE_OTHER): Payer: Self-pay | Admitting: Internal Medicine

## 2019-02-16 ENCOUNTER — Other Ambulatory Visit: Payer: Self-pay

## 2019-02-16 ENCOUNTER — Encounter: Payer: Self-pay | Admitting: Internal Medicine

## 2019-02-16 VITALS — BP 155/95

## 2019-02-16 DIAGNOSIS — J45901 Unspecified asthma with (acute) exacerbation: Secondary | ICD-10-CM

## 2019-02-16 DIAGNOSIS — B3731 Acute candidiasis of vulva and vagina: Secondary | ICD-10-CM

## 2019-02-16 DIAGNOSIS — B373 Candidiasis of vulva and vagina: Secondary | ICD-10-CM

## 2019-02-16 MED ORDER — PREDNISONE 10 MG PO TABS: 10.0000 mg | ORAL_TABLET | ORAL | 0 refills | Status: AC

## 2019-02-16 MED ORDER — AMOXICILLIN-POT CLAVULANATE 875-125 MG PO TABS: 1.0000 | ORAL_TABLET | Freq: Two times a day (BID) | ORAL | 0 refills | Status: AC

## 2019-02-16 MED ORDER — FLUCONAZOLE 100 MG PO TABS: 100.0000 mg | ORAL_TABLET | Freq: Every day | ORAL | 0 refills | Status: AC

## 2019-02-16 NOTE — Progress Notes (Signed)
Date:  02/16/2019   Name:  Crystal Rich   DOB:  28-Dec-1965   MRN:  [id number]  I connected with this patient, Crystal Rich, by telephone at the patient's home.  I verified that I am speaking with the correct person using two identifiers. This visit was conducted via telephone due to the Covid-19 outbreak from my office at [facility] in [place], [place]. I discussed the limitations, risks, security and privacy concerns of performing an evaluation and management service by telephone. I also discussed with the patient that there may be a patient responsible charge related to this service. The patient expressed understanding and agreed to proceed.  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 Has Runny nose, Cough, and Headache as well as fatigue. )  URI  This is a new problem. The current episode started yesterday. There has been no fever. Associated symptoms include congestion, coughing, ear pain, a plugged ear sensation, sinus pain, a sore throat and swollen glands. Pertinent negatives include no chest pain, diarrhea, headaches, nausea, rhinorrhea, vomiting or wheezing. She has tried decongestant (inhaler, flonase) for the symptoms. The treatment provided mild relief.    Review of Systems  Constitutional: Negative for chills, diaphoresis, fatigue and fever.  HENT: Positive for congestion, ear pain, sinus pain and sore throat. Negative for rhinorrhea.   Respiratory: Positive for cough, chest tightness and shortness of breath. Negative for wheezing.   Cardiovascular: Negative for chest pain and palpitations.  Gastrointestinal: Negative for diarrhea, nausea and vomiting.  Neurological: Negative for dizziness, light-headedness and headaches.    Patient Active Problem List   Diagnosis Date Noted  . Gastroesophageal reflux disease without esophagitis 12/24/2017  . Hemorrhoids 01/31/2017  . Dysphagia 12/31/2016  . Restless legs syndrome  12/31/2016  . Essential hypertension 12/31/2016  . Lymphadenopathy, axillary 04/25/2015  . Type II diabetes mellitus with complication (HCC) 04/25/2015  . Insomnia 03/11/2015  . Chronic abdominal pain 11/17/2014  . C. difficile colitis 11/17/2014  . Disorder of esophagus 11/17/2014  . Anxiety, generalized 11/17/2014  . 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 Hot flash, menopausal 11/17/2014  . Migraine without aura and responsive to treatment 11/17/2014  . Asthma, moderate persistent 11/17/2014  . Abdominal pain 04/02/2012    Allergies  Allergen Reactions  . Nsaids Shortness Of Breath  . Azithromycin Rash  . Sulfa Antibiotics Rash  . Aspirin   . Ketorolac     Other reaction(s): Other (See Comments) Patient states Burning sensation inside Other reaction(s): Other (See Comments) Patient states Burning sensation inside  . Morphine Sulfate Nausea And Vomiting  . Nalbuphine     Other reaction(s): Other (See Comments) Burning on inside  . Prochlorperazine Edisylate Other (See Comments)    tachycardia  . Trazodone Cough  . Bupropion Rash  . Oxybutynin Rash    Past Surgical History:  Procedure Laterality Date  . ABDOMINAL HYSTERECTOMY  1999  . APPENDECTOMY    . BILATERAL SALPINGOOPHORECTOMY  2010  . CHOLECYSTECTOMY  2005  . COLONOSCOPY  2016    Social History   Tobacco Use  . Smoking status: Never Smoker  . Smokeless tobacco: Never Used  Substance Use Topics  . Alcohol use: No    Alcohol/week: 0.0 standard drinks  . Drug use: No     Medication list has been reviewed and updated.  Current Meds  Medication Sig  . albuterol (VENTOLIN HFA) 108 (90 Base) MCG/ACT inhaler Inhale 2  puffs into the lungs See admin instructions.  . clonazePAM (KLONOPIN) 1 MG tablet 2 (two) times daily as needed.   . cyclobenzaprine (FLEXERIL) 10 MG tablet TAKE 1 TABLET BY MOUTH THREE TIMES A DAY AS NEEDED FOR MUSCLE SPASMS  . diltiazem (CARDIZEM) 60 MG  tablet   . empagliflozin (JARDIANCE) 25 MG TABS tablet Take 25 mg by mouth daily.  Marland Kitchen escitalopram (LEXAPRO) 20 MG tablet Take 1 tablet by mouth daily.   . fluticasone (FLONASE) 50 MCG/ACT nasal spray   . hydrOXYzine (ATARAX/VISTARIL) 25 MG tablet Take 2 tablets by mouth every 8 (eight) hours as needed.   Marland Kitchen losartan (COZAAR) 50 MG tablet TAKE 1 TABLET BY MOUTH EVERY DAY  . metFORMIN (GLUCOPHAGE) 500 MG tablet Take 3 tablets (1,500 mg total) by mouth daily. with food  . montelukast (SINGULAIR) 10 MG tablet TAKE 1 TABLET BY MOUTH EVERYDAY AT BEDTIME  . omeprazole (PRILOSEC) 20 MG capsule Take 1 capsule (20 mg total) by mouth 2 (two) times daily before a meal.  . pramipexole (MIRAPEX) 1 MG tablet Take 1 tablet (1 mg total) by mouth at bedtime.  . promethazine (PHENERGAN) 25 MG tablet Take 1 tablet (25 mg total) by mouth 2 (two) times daily as needed.  . rizatriptan (MAXALT-MLT) 10 MG disintegrating tablet May repeat in 2 hours if needed  . rOPINIRole (REQUIP) 3 MG tablet Take 1 tablet (3 mg total) by mouth at bedtime.  . saxagliptin HCl (ONGLYZA) 5 MG TABS tablet Take 1 tablet (5 mg total) by mouth daily.  . theophylline (UNIPHYL) 400 MG 24 hr tablet Take 1 tablet (400 mg total) by mouth daily.  . traMADol (ULTRAM) 50 MG tablet Take 50 mg by mouth every 6 (six) hours as needed.   . [DISCONTINUED] buPROPion (WELLBUTRIN) 75 MG tablet TAKE 1 TABLET BY MOUTH EVERY DAY IN THE MORNING    PHQ 2/9 Scores 02/16/2019 01/01/2019 10/27/2018 11/15/2017  PHQ - 2 Score 2 1 1  0  PHQ- 9 Score 2 4 10  -    BP Readings from Last 3 Encounters:  02/16/19 (!) 155/95  01/01/19 133/79  10/27/18 140/78    Physical Exam Pulmonary:     Comments: No cough or dyspnea noted during the call Voice quality nasal Frequent sniffing noted Neurological:     Mental Status: She is alert.  Psychiatric:        Attention and Perception: Attention normal.        Mood and Affect: Mood normal.        Speech: Speech normal.         Cognition and Memory: Cognition normal.     Wt Readings from Last 3 Encounters:  01/01/19 158 lb (71.7 kg)  10/27/18 158 lb (71.7 kg)  11/15/17 159 lb (72.1 kg)    BP (!) 155/95   Assessment and Plan: 1. Asthma exacerbation, mild Continue sudafed and flonase for ear symptoms Will give prednisone taper and augmentin Continue all usual asthma maintenance meds - amoxicillin-clavulanate (AUGMENTIN) 875-125 MG tablet; Take 1 tablet by mouth 2 (two) times daily for 10 days.  Dispense: 20 tablet; Refill: 0 - predniSONE (DELTASONE) 10 MG tablet; Take 1 tablet (10 mg total) by mouth as directed for 6 days. Take 6,5,4,3,2,1 then stop  Dispense: 21 tablet; Refill: 0  2. Yeast vaginitis Will rx fluconazole for anticipated yeast vaginitis from antibiotics - fluconazole (DIFLUCAN) 100 MG tablet; Take 1 tablet (100 mg total) by mouth daily for 3 days.  Dispense: 3 tablet; Refill:  0  I spent 10 minutes on this encounter. Partially dictated using Animal nutritionistDragon software. Any errors are unintentional.  Bari EdwardLaura Hadasah Brugger, MD [facility]  02/16/2019

## 2019-02-24 ENCOUNTER — Other Ambulatory Visit: Payer: Self-pay | Admitting: Internal Medicine

## 2019-02-24 DIAGNOSIS — S46909A Unspecified injury of unspecified muscle, fascia and tendon at shoulder and upper arm level, unspecified arm, initial encounter: Secondary | ICD-10-CM

## 2019-03-11 ENCOUNTER — Telehealth: Payer: Self-pay

## 2019-03-26 NOTE — Telephone Encounter (Signed)
Error

## 2019-03-27 ENCOUNTER — Telehealth: Payer: Self-pay

## 2019-03-27 NOTE — Telephone Encounter (Signed)
Pt called stating she needs a referral to a allergist because she tested positive for alpha gal from GI.   Called and left detailed msg informing pt there is no need for a referral. She just need to change diet and only eat fish and chicken as meat in her diet. No sheep, deer, pork, beef, or goat. Told her to follow up with GI as needed.  Benedict Needy, CMA

## 2019-04-20 ENCOUNTER — Other Ambulatory Visit: Payer: Self-pay | Admitting: Internal Medicine

## 2019-04-22 ENCOUNTER — Encounter: Payer: Self-pay | Admitting: Internal Medicine

## 2019-04-22 ENCOUNTER — Other Ambulatory Visit: Payer: Self-pay | Admitting: Internal Medicine

## 2019-04-22 DIAGNOSIS — S46909A Unspecified injury of unspecified muscle, fascia and tendon at shoulder and upper arm level, unspecified arm, initial encounter: Secondary | ICD-10-CM

## 2019-04-28 ENCOUNTER — Encounter: Payer: Self-pay | Admitting: Internal Medicine

## 2019-05-11 ENCOUNTER — Other Ambulatory Visit: Payer: Self-pay

## 2019-05-11 DIAGNOSIS — I10 Essential (primary) hypertension: Secondary | ICD-10-CM

## 2019-05-11 MED ORDER — LOSARTAN POTASSIUM 50 MG PO TABS: 50.0000 mg | ORAL_TABLET | Freq: Every day | ORAL | 4 refills | Status: DC

## 2019-05-11 MED ORDER — PROMETHAZINE HCL 25 MG PO TABS: 25.0000 mg | ORAL_TABLET | Freq: Two times a day (BID) | ORAL | 0 refills | Status: DC | PRN

## 2019-05-18 ENCOUNTER — Other Ambulatory Visit: Payer: Self-pay | Admitting: Internal Medicine

## 2019-05-21 ENCOUNTER — Other Ambulatory Visit: Payer: Self-pay

## 2019-05-21 MED ORDER — SITAGLIPTIN PHOSPHATE 100 MG PO TABS: 100.0000 mg | ORAL_TABLET | Freq: Every day | ORAL | 5 refills | Status: DC

## 2019-05-21 NOTE — Progress Notes (Signed)
Received FAX stating pt insurance no longer covers ONGLYZA. Switching pt to Januvia 100 mg daily. Spoke with pt and she is informed of Dr Karn Cassis change and why.   Will finish her current dose of Onglyza and then pick up Januvia to start taking.  Pt has CPE next week so will come in then for exam.  Lovett Calender, CMA

## 2019-05-25 ENCOUNTER — Encounter: Payer: Self-pay | Admitting: Internal Medicine

## 2019-06-02 ENCOUNTER — Other Ambulatory Visit: Payer: Self-pay | Admitting: Internal Medicine

## 2019-06-15 ENCOUNTER — Telehealth: Payer: Self-pay

## 2019-06-15 ENCOUNTER — Other Ambulatory Visit: Payer: Self-pay | Admitting: Internal Medicine

## 2019-06-15 DIAGNOSIS — G2581 Restless legs syndrome: Secondary | ICD-10-CM

## 2019-06-15 DIAGNOSIS — E118 Type 2 diabetes mellitus with unspecified complications: Secondary | ICD-10-CM

## 2019-06-15 MED ORDER — LINAGLIPTIN 5 MG PO TABS: 5.0000 mg | ORAL_TABLET | Freq: Every day | ORAL | 0 refills | Status: DC

## 2019-06-15 NOTE — Telephone Encounter (Signed)
Pt called saying she was switched from Onglyza to [facility] and she has had bad side effects. Dizziness, and sick to her stomach. Said she stopped the Januvia and her symptoms have gotten better.   Wants to know the next medication she needs to try.   Please advise?

## 2019-06-15 NOTE — Telephone Encounter (Signed)
Pt informed

## 2019-06-15 NOTE — Telephone Encounter (Signed)
I sent in Tradjenta.

## 2019-06-15 NOTE — Telephone Encounter (Signed)
She can call her insurance to see if Crystal Rich is covered - that is the only other medication is the same class as Onglyza.

## 2019-06-15 NOTE — Telephone Encounter (Signed)
Patient would like to have Tradjenta called into her pharmacy. 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

## 2019-06-16 ENCOUNTER — Other Ambulatory Visit: Payer: Self-pay

## 2019-06-16 MED ORDER — SAXAGLIPTIN HCL 5 MG PO TABS: 5.0000 mg | ORAL_TABLET | Freq: Every day | ORAL | 1 refills | Status: DC

## 2019-06-19 ENCOUNTER — Other Ambulatory Visit: Payer: Self-pay | Admitting: Internal Medicine

## 2019-06-19 DIAGNOSIS — S46909A Unspecified injury of unspecified muscle, fascia and tendon at shoulder and upper arm level, unspecified arm, initial encounter: Secondary | ICD-10-CM

## 2019-06-22 ENCOUNTER — Telehealth: Payer: Self-pay

## 2019-06-22 NOTE — Telephone Encounter (Signed)
Completed PA for Onglyza 5 mg tabs.   Patient intolerant to Janumet & Januvia and is already taking metformin and Jardiance.  Awaiting PA response.  " Your information has been submitted to [facility]. [employer] will review the request and fax you a determination directly, typically within 3 business days of your submission once all necessary information is received.  If [employer] has not responded in 3 business days or if you have any questions about your submission, contact [employer] at [phone]. "   (Key: [id number]) Rx #: [id number]

## 2019-07-21 LAB — HM DIABETES EYE EXAM

## 2019-07-22 ENCOUNTER — Other Ambulatory Visit: Payer: Self-pay

## 2019-07-22 MED ORDER — MONTELUKAST SODIUM 10 MG PO TABS: ORAL_TABLET | ORAL | 0 refills | Status: DC

## 2019-07-30 ENCOUNTER — Other Ambulatory Visit: Payer: Self-pay

## 2019-07-30 MED ORDER — FLUCONAZOLE 100 MG PO TABS: 100.0000 mg | ORAL_TABLET | Freq: Every day | ORAL | 0 refills | Status: AC

## 2019-07-31 ENCOUNTER — Other Ambulatory Visit: Payer: Self-pay | Admitting: Internal Medicine

## 2019-07-31 DIAGNOSIS — G2581 Restless legs syndrome: Secondary | ICD-10-CM

## 2019-08-10 ENCOUNTER — Encounter: Payer: Self-pay | Admitting: Internal Medicine

## 2019-08-12 ENCOUNTER — Telehealth: Payer: Self-pay

## 2019-08-12 ENCOUNTER — Other Ambulatory Visit: Payer: Self-pay

## 2019-08-12 ENCOUNTER — Other Ambulatory Visit: Payer: Self-pay | Admitting: Internal Medicine

## 2019-08-12 DIAGNOSIS — T3695XA Adverse effect of unspecified systemic antibiotic, initial encounter: Secondary | ICD-10-CM

## 2019-08-12 DIAGNOSIS — B379 Candidiasis, unspecified: Secondary | ICD-10-CM

## 2019-08-12 MED ORDER — FLUCONAZOLE 150 MG PO TABS: 150.0000 mg | ORAL_TABLET | Freq: Once | ORAL | 0 refills | Status: AC

## 2019-08-12 NOTE — Telephone Encounter (Signed)
Patient called saying she has another yeast infection. Sent in diflucan but told her since she has had several recently she needs to come in and talk to dr. [name] about this.   She said she has an appt next week and will discuss then.  CM

## 2019-08-17 ENCOUNTER — Ambulatory Visit (INDEPENDENT_AMBULATORY_CARE_PROVIDER_SITE_OTHER): Payer: BLUE CROSS/BLUE SHIELD | Admitting: Internal Medicine

## 2019-08-17 ENCOUNTER — Other Ambulatory Visit: Payer: Self-pay

## 2019-08-17 ENCOUNTER — Encounter: Payer: Self-pay | Admitting: Internal Medicine

## 2019-08-17 VITALS — BP 124/84 | HR 86 | Temp 97.8°F | Ht 61.0 in | Wt 148.0 lb

## 2019-08-17 DIAGNOSIS — E118 Type 2 diabetes mellitus with unspecified complications: Secondary | ICD-10-CM | POA: Diagnosis not present

## 2019-08-17 DIAGNOSIS — F3341 Major depressive disorder, recurrent, in partial remission: Secondary | ICD-10-CM

## 2019-08-17 DIAGNOSIS — G43009 Migraine without aura, not intractable, without status migrainosus: Secondary | ICD-10-CM

## 2019-08-17 DIAGNOSIS — Z23 Encounter for immunization: Secondary | ICD-10-CM | POA: Diagnosis not present

## 2019-08-17 DIAGNOSIS — Z1231 Encounter for screening mammogram for malignant neoplasm of breast: Secondary | ICD-10-CM | POA: Diagnosis not present

## 2019-08-17 DIAGNOSIS — I10 Essential (primary) hypertension: Secondary | ICD-10-CM | POA: Diagnosis not present

## 2019-08-17 DIAGNOSIS — J4540 Moderate persistent asthma, uncomplicated: Secondary | ICD-10-CM

## 2019-08-17 DIAGNOSIS — Z0000 Encounter for general adult medical examination without abnormal findings: Secondary | ICD-10-CM | POA: Diagnosis not present

## 2019-08-17 DIAGNOSIS — K219 Gastro-esophageal reflux disease without esophagitis: Secondary | ICD-10-CM | POA: Diagnosis not present

## 2019-08-17 LAB — POCT URINALYSIS DIPSTICK
Bilirubin, UA: NEGATIVE
Blood, UA: NEGATIVE
Glucose, UA: POSITIVE — AB
Ketones, UA: NEGATIVE
Leukocytes, UA: NEGATIVE
Nitrite, UA: NEGATIVE
Protein, UA: NEGATIVE
Spec Grav, UA: 1.015 (ref 1.010–1.025)
Urobilinogen, UA: 0.2 E.U./dL
pH, UA: 6 (ref 5.0–8.0)

## 2019-08-17 MED ORDER — PROMETHAZINE HCL 25 MG PO TABS: 25.0000 mg | ORAL_TABLET | Freq: Two times a day (BID) | ORAL | 0 refills | Status: DC | PRN

## 2019-08-17 MED ORDER — BREO ELLIPTA 100-25 MCG/INH IN AEPB: 1.0000 | INHALATION_SPRAY | Freq: Every day | RESPIRATORY_TRACT | 12 refills | Status: DC

## 2019-08-17 NOTE — Progress Notes (Signed)
Date:  08/17/2019   Name:  Crystal Rich   DOB:  [birth year]   MRN:  [id number]   Chief Complaint: Annual Exam (Breast Exam. Getting eye exam tomorrow. ), Diabetes (Follow up. A1C. Pnuemo vaccine-23), and Vaginitis (Reoccuring yeast infections.) Crystal Rich is a 54 y.o. female who presents today for her Complete Annual Exam. She feels well. She reports exercising none due to abdominal pain. She reports she is sleeping fairly well.   Mammogram - ordered Colonoscopy  2016 Pap discontinued Foot exam due/ eye exam due (scheduled tomorrow) PPV-23 due Immunization History  Administered Date(s) Administered  . Influenza-Unspecified 04/15/2018  . Pneumococcal Polysaccharide-23 08/17/2019  . Tdap 03/15/2008, 09/19/2012    Diabetes She presents for her follow-up diabetic visit. She has type 2 diabetes mellitus. Her disease course has been stable. Hypoglycemia symptoms include headaches. Pertinent negatives for hypoglycemia include no dizziness, nervousness/anxiousness or tremors. Pertinent negatives for diabetes include no chest pain, no fatigue, no polydipsia and no polyuria. (Recurrent yeast infections) Current diabetic treatment includes oral agent (dual therapy) (jardiance and metformin). She is compliant with treatment most of the time. Her weight is decreasing steadily. There is no change in her home blood glucose trend. Her breakfast blood glucose is taken between 7-8 am. Her breakfast blood glucose range is generally 130-140 mg/dl. An ACE inhibitor/angiotensin II receptor blocker is being taken.  Hypertension The problem is controlled. Associated symptoms include headaches. Pertinent negatives include no chest pain, palpitations or shortness of breath. Past treatments include angiotensin blockers. The current treatment provides significant improvement. There are no compliance problems.   Gastroesophageal Reflux She complains of coughing, heartburn and nausea. She reports no  abdominal pain, no chest pain or no wheezing. This is a recurrent problem. The problem occurs occasionally. Pertinent negatives include no fatigue. She has tried a PPI for the symptoms. The treatment provided significant relief. Past procedures include an EGD. s/p dilatation x 6 with good relief.  Migraine  This is a recurrent problem. The problem occurs monthly. The problem has been unchanged. Associated symptoms include coughing and nausea. Pertinent negatives include no abdominal pain, dizziness, fever, hearing loss, tinnitus or vomiting. She has tried triptans for the symptoms. The treatment provided significant relief. Her past medical history is significant for hypertension.  Asthma She complains of chest tightness and cough. There is no shortness of breath or wheezing. This is a recurrent problem. The problem occurs intermittently (worse with the season change). The cough is non-productive. Associated symptoms include headaches and heartburn. Pertinent negatives include no chest pain, fever or trouble swallowing. Her symptoms are alleviated by beta-agonist and leukotriene antagonist (and theophylline). She reports significant (but not lasting - not on a daily maintenance inhaler) improvement on treatment. Her past medical history is significant for asthma.  Depression        This is a chronic problem.  The problem has been gradually improving (since changing meds to Trintellix) since onset.  Associated symptoms include headaches.  Associated symptoms include no fatigue.   Lab Results  Component Value Date   CREATININE 0.61 10/27/2018   BUN 7 10/27/2018   NA 139 10/27/2018   K 4.2 10/27/2018   CL 97 10/27/2018   CO2 24 10/27/2018   Lab Results  Component Value Date   CHOL 152 10/27/2018   HDL 41 10/27/2018   LDLCALC 54 10/27/2018   TRIG 287 (H) 10/27/2018   CHOLHDL 3.7 10/27/2018   Lab Results  Component Value Date   TSH  2.430 10/27/2018   Lab Results  Component Value Date    HGBA1C 8.4 (H) 10/27/2018   Lab Results  Component Value Date   WBC 7.2 10/27/2018   HGB 13.5 10/27/2018   HCT 40.4 10/27/2018   MCV 83 10/27/2018   PLT 279 10/27/2018   Lab Results  Component Value Date   ALT 29 10/27/2018   AST 29 10/27/2018   ALKPHOS 120 (H) 10/27/2018   BILITOT 0.2 10/27/2018     Review of Systems  Constitutional: Negative for chills, fatigue and fever.  HENT: Negative for congestion, hearing loss, tinnitus, trouble swallowing and voice change.   Eyes: Negative for visual disturbance.  Respiratory: Positive for cough. Negative for chest tightness, shortness of breath and wheezing.   Cardiovascular: Negative for chest pain, palpitations and leg swelling.  Gastrointestinal: Positive for heartburn and nausea. Negative for abdominal pain, constipation, diarrhea and vomiting.  Endocrine: Negative for polydipsia and polyuria.  Genitourinary: Negative for dysuria, frequency and genital sores.       Recurrent yeast infections  Musculoskeletal: Negative for arthralgias, gait problem and joint swelling.  Skin: Negative for color change and rash.  Neurological: Positive for headaches. Negative for dizziness, tremors and light-headedness.  Hematological: Negative for adenopathy. Does not bruise/bleed easily.  Psychiatric/Behavioral: Positive for depression. Negative for dysphoric mood (improving with medication changes) and sleep disturbance. The patient is not nervous/anxious.     Patient Active Problem List   Diagnosis Date Noted  . Gastroesophageal reflux disease without esophagitis 12/24/2017  . History of Clostridium difficile colitis 04/04/2017  . Hemorrhoids 01/31/2017  . Restless legs syndrome 12/31/2016  . Essential hypertension 12/31/2016  . Lymphadenopathy, axillary 04/25/2015  . Type II diabetes mellitus with complication ([place]) 67/04/4579  . Insomnia 03/11/2015  . Chronic abdominal pain 11/17/2014  . C. difficile colitis 11/17/2014  . Anxiety,  generalized 11/17/2014  . Depression, major, recurrent, in partial remission ([place]) 11/17/2014  . Hot flash, menopausal 11/17/2014  . Migraine without aura and responsive to treatment 11/17/2014  . Asthma, moderate persistent 11/17/2014  . Abdominal pain 04/02/2012    Allergies  Allergen Reactions  . Nsaids Shortness Of Breath  . Azithromycin Rash  . Januvia [Sitagliptin] Nausea And Vomiting  . Sulfa Antibiotics Rash  . Aspirin   . Ketorolac     Other reaction(s): Other (See Comments) Patient states Burning sensation inside Other reaction(s): Other (See Comments) Patient states Burning sensation inside  . Morphine Sulfate Nausea And Vomiting  . Nalbuphine     Other reaction(s): Other (See Comments) Burning on inside  . Prochlorperazine Edisylate Other (See Comments)    tachycardia  . Trazodone Cough  . Bupropion Rash  . Oxybutynin Rash    Past Surgical History:  Procedure Laterality Date  . ABDOMINAL HYSTERECTOMY  1999  . APPENDECTOMY    . BILATERAL SALPINGOOPHORECTOMY  2010  . CHOLECYSTECTOMY  2005  . COLONOSCOPY  2016    Social History   Tobacco Use  . Smoking status: Never Smoker  . Smokeless tobacco: Never Used  Substance Use Topics  . Alcohol use: No    Alcohol/week: 0.0 standard drinks  . Drug use: No     Medication list has been reviewed and updated.  Current Meds  Medication Sig  . albuterol (VENTOLIN HFA) 108 (90 Base) MCG/ACT inhaler INHALE 2 PUFFS BY MOUTH INTO THE LUNGS AS DIRECTED  . clonazePAM (KLONOPIN) 1 MG tablet 2 (two) times daily as needed.   . cyclobenzaprine (FLEXERIL) 10 MG tablet TAKE ONE  TABLET BY MOUTH 3 TIMES DAILY AS NEEDED FOR MUSCLE SPASM  . empagliflozin (JARDIANCE) 25 MG TABS tablet Take 25 mg by mouth daily.  . fluticasone (FLONASE) 50 MCG/ACT nasal spray   . hydrOXYzine (ATARAX/VISTARIL) 25 MG tablet Take 2 tablets by mouth every 8 (eight) hours as needed.   Marland Kitchen losartan (COZAAR) 50 MG tablet Take 1 tablet (50 mg total) by  mouth daily.  . metFORMIN (GLUCOPHAGE) 500 MG tablet Take 3 tablets (1,500 mg total) by mouth daily. with food  . montelukast (SINGULAIR) 10 MG tablet TAKE 1 TABLET BY MOUTH EVERYDAY AT BEDTIME  . promethazine (PHENERGAN) 25 MG tablet Take 1 tablet (25 mg total) by mouth 2 (two) times daily as needed.  Marland Kitchen REXULTI 2 MG TABS tablet Take 1 mg by mouth daily.   . rizatriptan (MAXALT-MLT) 10 MG disintegrating tablet DISSOLVE 1 TABLET BY MOUTH AS NEEDED FORHEADACHE, MAY REPEAT IN 2 HOURS IF NEEDED  . rOPINIRole (REQUIP) 1 MG tablet TAKE 3 TABLETS BY MOUTH AT BEDTIME  . theophylline (UNIPHYL) 400 MG 24 hr tablet TAKE 1 TABLET BY MOUTH EVERY DAY  . traMADol (ULTRAM) 50 MG tablet Take 50 mg by mouth every 6 (six) hours as needed.   . TRINTELLIX 10 MG TABS tablet Take 10 mg by mouth daily.  . [DISCONTINUED] promethazine (PHENERGAN) 25 MG tablet TAKE 1 TABLET BY MOUTH TWICE DAILY AS NEEDED    PHQ 2/9 Scores 08/17/2019 02/16/2019 01/01/2019 10/27/2018  PHQ - 2 Score 6 2 1 1   PHQ- 9 Score 11 2 4 10     BP Readings from Last 3 Encounters:  08/17/19 124/84  02/16/19 (!) 155/95  01/01/19 133/79    Physical Exam Vitals and nursing note reviewed.  Constitutional:      General: She is not in acute distress.    Appearance: Normal appearance. She is well-developed.  HENT:     Head: Normocephalic and atraumatic.     Right Ear: Tympanic membrane and ear canal normal.     Left Ear: Tympanic membrane and ear canal normal.     Nose:     Right Sinus: No maxillary sinus tenderness.     Left Sinus: No maxillary sinus tenderness.  Eyes:     General: No scleral icterus.       Right eye: No discharge.        Left eye: No discharge.     Conjunctiva/sclera: Conjunctivae normal.  Neck:     Thyroid: No thyromegaly.     Vascular: No carotid bruit.  Cardiovascular:     Rate and Rhythm: Normal rate and regular rhythm.     Pulses: Normal pulses.     Heart sounds: Normal heart sounds. No murmur.  Pulmonary:      Effort: Pulmonary effort is normal. No respiratory distress.     Breath sounds: No wheezing.  Chest:     Breasts:        Right: No mass, nipple discharge, skin change or tenderness.        Left: No mass, nipple discharge, skin change or tenderness.  Abdominal:     General: Bowel sounds are normal.     Palpations: Abdomen is soft. There is no mass.     Tenderness: There is abdominal tenderness.  Musculoskeletal:     Cervical back: Normal range of motion. No erythema.     Right lower leg: No edema.     Left lower leg: No edema.  Lymphadenopathy:     Cervical: No cervical  adenopathy.  Skin:    General: Skin is warm and dry.     Capillary Refill: Capillary refill takes less than 2 seconds.     Findings: No rash.  Neurological:     General: No focal deficit present.     Mental Status: She is alert and oriented to person, place, and time.     Cranial Nerves: No cranial nerve deficit.     Sensory: No sensory deficit.     Deep Tendon Reflexes: Reflexes are normal and symmetric.  Psychiatric:        Attention and Perception: Attention normal.        Mood and Affect: Mood normal.        Speech: Speech normal.        Behavior: Behavior normal.     Wt Readings from Last 3 Encounters:  08/17/19 148 lb (67.1 kg)  01/01/19 158 lb (71.7 kg)  10/27/18 158 lb (71.7 kg)    BP 124/84   Pulse 86   Temp 97.8 F (36.6 C) (Temporal)   Ht 5\' 1"  (1.549 m)   Wt 148 lb (67.1 kg)   SpO2 97%   BMI 27.96 kg/m   Assessment and Plan: 1. Annual physical exam Normal exam Continue to work on healthy diet, exercise as able - POCT urinalysis dipstick  2. Encounter for screening mammogram for breast cancer Will try schedule [facility] at [facility]; she can also check with [facility] near her home. - MM 3D SCREEN BREAST BILATERAL; Future  3. Essential hypertension Clinically stable exam with well controlled BP. Tolerating medications without side effects at this time. Pt to continue current regimen  and low sodium diet; benefits of regular exercise as able discussed. - CBC with Differential/Platelet - TSH  4. Moderate persistent asthma without complication Currently on theophylline and singulair and using Ventolin bid Will add Breo and see is we can reduce Ventollin use and maybe discontinue Theophylline - fluticasone furoate-vilanterol (BREO ELLIPTA) 100-25 MCG/INH AEPB; Inhale 1 puff into the lungs daily.  Dispense: 1 each; Refill: 12  5. Type II diabetes mellitus with complication (HCC) Clinically stable by exam and report without s/s of hypoglycemia. DM complicated by HTN. Tolerating medications well without side effects or other concerns. - Comprehensive metabolic panel - Hemoglobin A1c - Lipid panel  6. Gastroesophageal reflux disease without esophagitis Symptoms well controlled on daily PPI No red flag signs such as weight loss, n/v, melena Will continue omeprazole. She will continue to follow up with GI as needed for dysphagia, recurrent diarrhea, etc  7. Migraine without aura and responsive to treatment Doing well with PRN triptan therapy - promethazine (PHENERGAN) 25 MG tablet; Take 1 tablet (25 mg total) by mouth 2 (two) times daily as needed.  Dispense: 180 tablet; Refill: 0  8. Need for vaccination for pneumococcus Discussed and given today  9. Depression, major, recurrent, in partial remission [facility]) Being followed by Psych Mood is much brighter today - continue medications and follow up as planned   Partially dictated using Dragon software. Any errors are unintentional.  [facility], MD [facility]  08/17/2019

## 2019-08-18 ENCOUNTER — Other Ambulatory Visit: Payer: Self-pay | Admitting: Internal Medicine

## 2019-08-18 DIAGNOSIS — E118 Type 2 diabetes mellitus with unspecified complications: Secondary | ICD-10-CM

## 2019-08-18 LAB — LIPID PANEL
Chol/HDL Ratio: 3.5 ratio (ref 0.0–4.4)
Cholesterol, Total: 155 mg/dL (ref 100–199)
HDL: 44 mg/dL (ref >39)
LDL Chol Calc (NIH): 79 mg/dL (ref 0–99)
Triglycerides: 187 mg/dL — ABNORMAL HIGH (ref 0–149)
VLDL Cholesterol Cal: 32 mg/dL (ref 5–40)

## 2019-08-18 LAB — COMPREHENSIVE METABOLIC PANEL
ALT: 32 IU/L (ref 0–32)
AST: 34 IU/L (ref 0–40)
Albumin/Globulin Ratio: 1.7 (ref 1.2–2.2)
Albumin: 4.7 g/dL (ref 3.8–4.9)
Alkaline Phosphatase: 129 IU/L — ABNORMAL HIGH (ref 39–117)
BUN/Creatinine Ratio: 11 (ref 9–23)
BUN: 9 mg/dL (ref 6–24)
Bilirubin Total: 0.3 mg/dL (ref 0.0–1.2)
CO2: 24 mmol/L (ref 20–29)
Calcium: 10.1 mg/dL (ref 8.7–10.2)
Chloride: 97 mmol/L (ref 96–106)
Creatinine, Ser: 0.8 mg/dL (ref 0.57–1.00)
GFR calc Af Amer: 98 mL/min/{1.73_m2} (ref >59)
GFR calc non Af Amer: 85 mL/min/{1.73_m2} (ref >59)
Globulin, Total: 2.7 g/dL (ref 1.5–4.5)
Glucose: 117 mg/dL — ABNORMAL HIGH (ref 65–99)
Potassium: 4.8 mmol/L (ref 3.5–5.2)
Sodium: 137 mmol/L (ref 134–144)
Total Protein: 7.4 g/dL (ref 6.0–8.5)

## 2019-08-18 LAB — CBC WITH DIFFERENTIAL/PLATELET
Basophils Absolute: 0.1 10*3/uL (ref 0.0–0.2)
Basos: 1 %
EOS (ABSOLUTE): 0.1 10*3/uL (ref 0.0–0.4)
Eos: 1 %
Hematocrit: 43.1 % (ref 34.0–46.6)
Hemoglobin: 14.5 g/dL (ref 11.1–15.9)
Immature Grans (Abs): 0 10*3/uL (ref 0.0–0.1)
Immature Granulocytes: 0 %
Lymphocytes Absolute: 1.5 10*3/uL (ref 0.7–3.1)
Lymphs: 19 %
MCH: 27.3 pg (ref 26.6–33.0)
MCHC: 33.6 g/dL (ref 31.5–35.7)
MCV: 81 fL (ref 79–97)
Monocytes Absolute: 0.5 10*3/uL (ref 0.1–0.9)
Monocytes: 6 %
Neutrophils Absolute: 5.8 10*3/uL (ref 1.4–7.0)
Neutrophils: 73 %
Platelets: 307 10*3/uL (ref 150–450)
RBC: 5.31 x10E6/uL — ABNORMAL HIGH (ref 3.77–5.28)
RDW: 14.1 % (ref 11.7–15.4)
WBC: 8.1 10*3/uL (ref 3.4–10.8)

## 2019-08-18 LAB — TSH: TSH: 2.38 u[IU]/mL (ref 0.450–4.500)

## 2019-08-18 LAB — HEMOGLOBIN A1C
Est. average glucose Bld gHb Est-mCnc: 206 mg/dL
Hgb A1c MFr Bld: 8.8 % — ABNORMAL HIGH (ref 4.8–5.6)

## 2019-08-18 MED ORDER — GLIMEPIRIDE 2 MG PO TABS: 2.0000 mg | ORAL_TABLET | Freq: Every day | ORAL | 3 refills | Status: DC

## 2019-08-31 ENCOUNTER — Other Ambulatory Visit: Payer: Self-pay | Admitting: Internal Medicine

## 2019-08-31 MED ORDER — RIZATRIPTAN BENZOATE 10 MG PO TBDP: ORAL_TABLET | ORAL | 0 refills | Status: DC

## 2019-08-31 NOTE — Telephone Encounter (Signed)
Medication Refill - Medication:  rizatriptan (MAXALT-MLT) 10 MG disintegrating tablet    Has the patient contacted their pharmacy? Yes advised to call office.   Preferred Pharmacy (with phone number or street name):  [facility] [place] - [address] Phone:  [phone]  Fax:  [phone]       Agent: Please be advised that RX refills may take up to 3 business days. We ask that you follow-up with your pharmacy.

## 2019-09-02 ENCOUNTER — Telehealth: Payer: Self-pay | Admitting: Internal Medicine

## 2019-09-02 ENCOUNTER — Other Ambulatory Visit: Payer: Self-pay | Admitting: Internal Medicine

## 2019-09-02 ENCOUNTER — Other Ambulatory Visit: Payer: Self-pay

## 2019-09-02 DIAGNOSIS — S46909A Unspecified injury of unspecified muscle, fascia and tendon at shoulder and upper arm level, unspecified arm, initial encounter: Secondary | ICD-10-CM

## 2019-09-02 MED ORDER — PANTOPRAZOLE SODIUM 40 MG PO TBEC
40.0000 mg | DELAYED_RELEASE_TABLET | Freq: Two times a day (BID) | ORAL | 1 refills | Status: DC
Start: 2019-09-02 — End: 2020-03-01

## 2019-09-02 NOTE — Telephone Encounter (Signed)
Sent in RF.  CM

## 2019-09-02 NOTE — Telephone Encounter (Signed)
Requested medication (s) are due for refill today:yes  Requested medication (s) are on the active medication list: yes  Last refill:  06/19/19  Future visit scheduled:yes  Notes to clinic:  Medication not delegated    Requested Prescriptions  Pending Prescriptions Disp Refills   cyclobenzaprine (FLEXERIL) 10 MG tablet [Pharmacy Med Name: CYCLOBENZAPRINE HCL 10 MG TAB] 30 tablet 2    Sig: TAKE ONE TABLET BY MOUTH 3 TIMES DAILY AS NEEDED FOR MUSCLE SPASM      Not Delegated - Analgesics:  Muscle Relaxants Failed - 09/02/2019 10:17 AM      Failed - This refill cannot be delegated      Passed - Valid encounter within last 6 months    Recent Outpatient Visits           2 weeks ago Annual physical exam   [facility] Reubin Milan, MD   6 months ago Asthma exacerbation, mild   [facility] Reubin Milan, MD   8 months ago Acute non-recurrent maxillary sinusitis   [facility] Reubin Milan, MD   10 months ago Type II diabetes mellitus with complication [facility])   [facility] Reubin Milan, MD   1 year ago Acute non-recurrent maxillary sinusitis   [facility] Reubin Milan, MD       Future Appointments             In 3 months [name] [name], MD [facility], [facility]

## 2019-09-02 NOTE — Telephone Encounter (Signed)
Pt is requesting rx for pantoprazole sodium 40 MG to take 2x a da for acid reflux and heartburn. Stated the provider she used to receive it from, she no longer sees. Please advise.

## 2019-09-29 ENCOUNTER — Other Ambulatory Visit: Payer: Self-pay | Admitting: Internal Medicine

## 2019-09-29 ENCOUNTER — Ambulatory Visit: Payer: Self-pay

## 2019-09-29 DIAGNOSIS — J4540 Moderate persistent asthma, uncomplicated: Secondary | ICD-10-CM

## 2019-09-29 MED ORDER — FLOVENT HFA 220 MCG/ACT IN AERO: 1.0000 | INHALATION_SPRAY | Freq: Two times a day (BID) | RESPIRATORY_TRACT | 12 refills | Status: DC

## 2019-09-29 NOTE — Telephone Encounter (Signed)
Pt. Reports she started a new inhaler, Breo Ellipta 2 months ago and also began having "heart palpitations and fast heart beat." "I notice it starts after I use it and stops by the evening, then starts back the next day after I use it.My pharmacist said it is probably the inhaler." Has had heart rate as high as 125. Denies any dizziness or chest pain. Offered appointment, declines.Wants to know if Dr. [name] can change her to another inhaler. Please advise.  Answer Assessment - Initial Assessment Questions 1. DESCRIPTION: "Please describe your heart rate or heart beat that you are having" (e.g., fast/slow, regular/irregular, skipped or extra beats, "palpitations")     Palpitations 2. ONSET: "When did it start?" (Minutes, hours or days)      2 months ago 3. DURATION: "How long does it last" (e.g., seconds, minutes, hours)     Lasts all day 4. PATTERN "Does it come and go, or has it been constant since it started?"  "Does it get worse with exertion?"   "Are you feeling it now?"     Lasts all day 5. TAP: "Using your hand, can you tap out what you are feeling on a chair or table in front of you, so that I can hear?" (Note: not all patients can do this)       n/a 6. HEART RATE: "Can you tell me your heart rate?" "How many beats in 15 seconds?"  (Note: not all patients can do this)       Sometimes 120-125 7. RECURRENT SYMPTOM: "Have you ever had this before?" If so, ask: "When was the last time?" and "What happened that time?"      Yes 8. CAUSE: "What do you think is causing the palpitations?"     My inhaler 9. CARDIAC HISTORY: "Do you have any history of heart disease?" (e.g., heart attack, angina, bypass surgery, angioplasty, arrhythmia)      No 10. OTHER SYMPTOMS: "Do you have any other symptoms?" (e.g., dizziness, chest pain, sweating, difficulty breathing)       No 11. PREGNANCY: "Is there any chance you are pregnant?" "When was your last menstrual period?"       No  Protocols used: HEART  RATE AND HEARTBEAT QUESTIONS-A-AH

## 2019-09-29 NOTE — Telephone Encounter (Signed)
Stop Breo.  Start Flovent twice a day - new Rx sent to pharmacy.

## 2019-09-29 NOTE — Telephone Encounter (Signed)
Requested Prescriptions  Pending Prescriptions Disp Refills  . rizatriptan (MAXALT-MLT) 10 MG disintegrating tablet [Pharmacy Med Name: RIZATRIPTAN BENZOATE 10 MG ODT] 6 tablet 0    Sig: DISSOLVE 1 TABLET BY MOUTH AS NEEDED FORHEADACHE, MAY REPEAT IN 2 HOURS IF NEEDED.     Neurology:  Migraine Therapy - Triptan Passed - 09/29/2019  1:35 PM      Passed - Last BP in normal range    BP Readings from Last 1 Encounters:  08/17/19 124/84         Passed - Valid encounter within last 12 months    Recent Outpatient Visits          1 month ago Annual physical exam   [facility] Reubin Milan, MD   7 months ago Asthma exacerbation, mild   [facility] Reubin [facility], MD   9 months ago Acute non-recurrent maxillary sinusitis   [facility] Reubin Milan, MD   11 months ago Type II diabetes mellitus with complication [facility])   [facility] Reubin Milan, MD   1 year ago Acute non-recurrent maxillary sinusitis   [facility] Reubin Milan, MD      Future Appointments            In 2 months [name] [name], MD [facility], [facility]

## 2019-09-30 NOTE — Telephone Encounter (Signed)
Patient informed- and already picked up RX and started today.  CM

## 2019-10-06 ENCOUNTER — Telehealth: Payer: Self-pay | Admitting: Internal Medicine

## 2019-10-06 NOTE — Telephone Encounter (Signed)
Called pt she said its her blood sugar that it would go low sometimes but not enough for her to have shivers.She mentioned that her blood sugar would be high as well. That she thought it was side effects from the medication itself. She also was having sweats. I made her and appt for Thursday, 6/3 to discuss the injectable medication.

## 2019-10-06 NOTE — Telephone Encounter (Signed)
Please find out if the problem is that her blood sugar is dropping too low or if the medication itself is causing her symptoms while her BS remains elevated.  If the BS is too low, then she can try to cut it in half.  The next medication to start will be brand name and an injectable that she will need an OV to start.

## 2019-10-06 NOTE — Telephone Encounter (Signed)
Please advise      KP 

## 2019-10-06 NOTE — Telephone Encounter (Signed)
Copied from CRM [id number]. Topic: General - Other >> Oct 06, 2019  8:18 AM Tamela Oddi wrote: Reason for CRM: Patient called to inform the doctor that she is having a bad reaction from the medication, glimepiride (AMARYL) 2 MG tablet.  She stated that it makes her shake and she has anxiety.  Would like a different medication prescribed.  CB# [phone]

## 2019-10-07 ENCOUNTER — Other Ambulatory Visit: Payer: Self-pay | Admitting: Internal Medicine

## 2019-10-07 DIAGNOSIS — I10 Essential (primary) hypertension: Secondary | ICD-10-CM

## 2019-10-07 NOTE — Telephone Encounter (Signed)
Requested Prescriptions  Pending Prescriptions Disp Refills  . losartan (COZAAR) 50 MG tablet [Pharmacy Med Name: LOSARTAN POTASSIUM 50 MG TAB] 30 tablet 4    Sig: TAKE 1 TABLET BY MOUTH EVERY DAY     Cardiovascular:  Angiotensin Receptor Blockers Passed - 10/07/2019  9:21 AM      Passed - Cr in normal range and within 180 days    Creatinine, Ser  Date Value Ref Range Status  08/17/2019 0.80 0.57 - 1.00 mg/dL Final         Passed - K in normal range and within 180 days    Potassium  Date Value Ref Range Status  08/17/2019 4.8 3.5 - 5.2 mmol/L Final         Passed - Patient is not pregnant      Passed - Last BP in normal range    BP Readings from Last 1 Encounters:  08/17/19 124/84         Passed - Valid encounter within last 6 months    Recent Outpatient Visits          1 month ago Annual physical exam   [facility] Reubin Milan, MD   7 months ago Asthma exacerbation, mild   [facility] Reubin Milan, MD   9 months ago Acute non-recurrent maxillary sinusitis   [facility] Reubin Milan, MD   11 months ago Type II diabetes mellitus with complication [facility])   [facility] Reubin Milan, MD   1 year ago Acute non-recurrent maxillary sinusitis   [facility] Reubin Milan, MD      Future Appointments            Tomorrow Reubin Milan, MD [facility], [facility]   In 2 months [name] [name], MD [facility], [facility]

## 2019-10-08 ENCOUNTER — Ambulatory Visit: Payer: Self-pay | Admitting: Internal Medicine

## 2019-10-08 ENCOUNTER — Telehealth: Payer: Self-pay | Admitting: Internal Medicine

## 2019-10-08 NOTE — Telephone Encounter (Signed)
Called pt about side effects to new inhaler she is taking. I told her if she is having side effects to medication that she needed to see Dr. [name] she stated that she had a appt scheduled for 10/13/19. Ask could she take her old inhaler because it doesn't give her any side effects I told her if she needed to take an inhaler because she was having problems then it would be best to try that inhaler.  KP

## 2019-10-08 NOTE — Telephone Encounter (Signed)
Copied from CRM [id number]. Topic: General - Inquiry >> Oct 08, 2019  8:56 AM Reggie Pile, NT wrote: Reason for CRM: Patient called in to cancel appointment for today at 10:40. Patient is having diarrhea, nausea, and other side effects. Patient would like to speak with nurse or PCP in regards to a medication she is on. Please advise.

## 2019-10-08 NOTE — Progress Notes (Deleted)
Date:  10/08/2019   Name:  Crystal Rich   DOB:  1965-12-19   MRN:  [id number]   Chief Complaint: No chief complaint on file.  Diabetes She presents for her follow-up diabetic visit. She has type 2 diabetes mellitus. Her disease course has been worsening. Current diabetic treatment includes oral agent (triple therapy) (metformin, glimepiride and jardiance).    Lab Results  Component Value Date   CREATININE 0.80 08/17/2019   BUN 9 08/17/2019   NA 137 08/17/2019   K 4.8 08/17/2019   CL 97 08/17/2019   CO2 24 08/17/2019   Lab Results  Component Value Date   CHOL 155 08/17/2019   HDL 44 08/17/2019   LDLCALC 79 08/17/2019   TRIG 187 (H) 08/17/2019   CHOLHDL 3.5 08/17/2019   Lab Results  Component Value Date   TSH 2.380 08/17/2019   Lab Results  Component Value Date   HGBA1C 8.8 (H) 08/17/2019   Lab Results  Component Value Date   WBC 8.1 08/17/2019   HGB 14.5 08/17/2019   HCT 43.1 08/17/2019   MCV 81 08/17/2019   PLT 307 08/17/2019   Lab Results  Component Value Date   ALT 32 08/17/2019   AST 34 08/17/2019   ALKPHOS 129 (H) 08/17/2019   BILITOT 0.3 08/17/2019     Review of Systems  Patient Active Problem List   Diagnosis Date Noted  . Gastroesophageal reflux disease without esophagitis 12/24/2017  . History of Clostridium difficile colitis 04/04/2017  . Hemorrhoids 01/31/2017  . Restless legs syndrome 12/31/2016  . Essential hypertension 12/31/2016  . Lymphadenopathy, axillary 04/25/2015  . Type II diabetes mellitus with complication (HCC) 04/25/2015  . Insomnia 03/11/2015  . Chronic abdominal pain 11/17/2014  . C. difficile colitis 11/17/2014  . Anxiety, generalized 11/17/2014  . 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 Hot flash, menopausal 11/17/2014  . Migraine without aura and responsive to treatment 11/17/2014  . Asthma, moderate persistent 11/17/2014  . Abdominal pain 04/02/2012    Allergies  Allergen  Reactions  . Nsaids Shortness Of Breath  . Azithromycin Rash  . Januvia [Sitagliptin] Nausea And Vomiting  . Sulfa Antibiotics Rash  . Aspirin   . Ketorolac     Other reaction(s): Other (See Comments) Patient states Burning sensation inside Other reaction(s): Other (See Comments) Patient states Burning sensation inside  . Morphine Sulfate Nausea And Vomiting  . Nalbuphine     Other reaction(s): Other (See Comments) Burning on inside  . Prochlorperazine Edisylate Other (See Comments)    tachycardia  . Trazodone Cough  . Bupropion Rash  . Oxybutynin Rash    Past Surgical History:  Procedure Laterality Date  . ABDOMINAL HYSTERECTOMY  1999  . APPENDECTOMY    . BILATERAL SALPINGOOPHORECTOMY  2010  . CHOLECYSTECTOMY  2005  . COLONOSCOPY  2016    Social History   Tobacco Use  . Smoking status: Never Smoker  . Smokeless tobacco: Never Used  Substance Use Topics  . Alcohol use: No    Alcohol/week: 0.0 standard drinks  . Drug use: No     Medication list has been reviewed and updated.  No outpatient medications have been marked as taking for the 10/08/19 encounter (Appointment) with Reubin Milan, MD.    [facility] 2/9 Scores 08/17/2019 02/16/2019 01/01/2019 10/27/2018  PHQ - 2 Score 6 2 1 1   PHQ- 9 Score 11 2 4 10     BP Readings from Last 3 Encounters:  08/17/19  124/84  02/16/19 (!) 155/95  01/01/19 133/79    Physical Exam  Wt Readings from Last 3 Encounters:  08/17/19 148 lb (67.1 kg)  01/01/19 158 lb (71.7 kg)  10/27/18 158 lb (71.7 kg)    There were no vitals taken for this visit.  Assessment and Plan:

## 2019-10-13 ENCOUNTER — Ambulatory Visit: Payer: BLUE CROSS/BLUE SHIELD | Admitting: Internal Medicine

## 2019-10-23 ENCOUNTER — Encounter: Payer: Self-pay | Admitting: Internal Medicine

## 2019-10-23 ENCOUNTER — Ambulatory Visit (INDEPENDENT_AMBULATORY_CARE_PROVIDER_SITE_OTHER): Payer: BLUE CROSS/BLUE SHIELD | Admitting: Internal Medicine

## 2019-10-23 ENCOUNTER — Other Ambulatory Visit: Payer: Self-pay

## 2019-10-23 VITALS — BP 116/78 | HR 117 | Ht 61.0 in | Wt 138.0 lb

## 2019-10-23 DIAGNOSIS — J4540 Moderate persistent asthma, uncomplicated: Secondary | ICD-10-CM | POA: Diagnosis not present

## 2019-10-23 DIAGNOSIS — E118 Type 2 diabetes mellitus with unspecified complications: Secondary | ICD-10-CM | POA: Diagnosis not present

## 2019-10-23 DIAGNOSIS — Z1231 Encounter for screening mammogram for malignant neoplasm of breast: Secondary | ICD-10-CM

## 2019-10-23 DIAGNOSIS — G2581 Restless legs syndrome: Secondary | ICD-10-CM | POA: Diagnosis not present

## 2019-10-23 MED ORDER — METFORMIN HCL 500 MG PO TABS: 1500.0000 mg | ORAL_TABLET | Freq: Every day | ORAL | 1 refills | Status: DC

## 2019-10-23 MED ORDER — MONTELUKAST SODIUM 10 MG PO TABS: ORAL_TABLET | ORAL | 1 refills | Status: DC

## 2019-10-23 MED ORDER — ROPINIROLE HCL 1 MG PO TABS: 3.0000 mg | ORAL_TABLET | Freq: Every day | ORAL | 1 refills | Status: DC

## 2019-10-23 MED ORDER — THEOPHYLLINE ER 400 MG PO TB24: 400.0000 mg | ORAL_TABLET | Freq: Every day | ORAL | 1 refills | Status: DC

## 2019-10-23 NOTE — Progress Notes (Signed)
Date:  10/23/2019   Name:  Crystal Rich   DOB:  1965/10/23   MRN:  [id number]   Chief Complaint: No chief complaint on file.  Diabetes She presents for her follow-up diabetic visit. She has type 2 diabetes mellitus. Her disease course has been worsening. Pertinent negatives for diabetes include no blurred vision, no chest pain, no fatigue, no foot paresthesias and no visual change. Current diabetic treatment includes oral agent (monotherapy) (intolerant of januvia, farxiga and glimepiride).    Lab Results  Component Value Date   CREATININE 0.80 08/17/2019   BUN 9 08/17/2019   NA 137 08/17/2019   K 4.8 08/17/2019   CL 97 08/17/2019   CO2 24 08/17/2019   Lab Results  Component Value Date   CHOL 155 08/17/2019   HDL 44 08/17/2019   LDLCALC 79 08/17/2019   TRIG 187 (H) 08/17/2019   CHOLHDL 3.5 08/17/2019   Lab Results  Component Value Date   TSH 2.380 08/17/2019   Lab Results  Component Value Date   HGBA1C 8.8 (H) 08/17/2019   Lab Results  Component Value Date   WBC 8.1 08/17/2019   HGB 14.5 08/17/2019   HCT 43.1 08/17/2019   MCV 81 08/17/2019   PLT 307 08/17/2019   Lab Results  Component Value Date   ALT 32 08/17/2019   AST 34 08/17/2019   ALKPHOS 129 (H) 08/17/2019   BILITOT 0.3 08/17/2019     Review of Systems  Constitutional: Positive for unexpected weight change. Negative for chills and fatigue.  Eyes: Negative for blurred vision.  Respiratory: Positive for shortness of breath and wheezing.   Cardiovascular: Negative for chest pain and palpitations.  Gastrointestinal: Positive for abdominal pain.    Patient Active Problem List   Diagnosis Date Noted  . Gastroesophageal reflux disease without esophagitis 12/24/2017  . History of Clostridium difficile colitis 04/04/2017  . Hemorrhoids 01/31/2017  . Restless legs syndrome 12/31/2016  . Essential hypertension 12/31/2016  . Lymphadenopathy, axillary 04/25/2015  . Type II diabetes mellitus with  complication (HCC) 04/25/2015  . Insomnia 03/11/2015  . Chronic abdominal pain 11/17/2014  . C. difficile colitis 11/17/2014  . Anxiety, generalized 11/17/2014  . 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 Hot flash, menopausal 11/17/2014  . Migraine without aura and responsive to treatment 11/17/2014  . Asthma, moderate persistent 11/17/2014  . Abdominal pain 04/02/2012    Allergies  Allergen Reactions  . Nsaids Shortness Of Breath  . Azithromycin Rash  . Januvia [Sitagliptin] Nausea And Vomiting  . Sulfa Antibiotics Rash  . Aspirin   . Ketorolac     Other reaction(s): Other (See Comments) Patient states Burning sensation inside Other reaction(s): Other (See Comments) Patient states Burning sensation inside  . Morphine Sulfate Nausea And Vomiting  . Nalbuphine     Other reaction(s): Other (See Comments) Burning on inside  . Prochlorperazine Edisylate Other (See Comments)    tachycardia  . Trazodone Cough  . Bupropion Rash  . Oxybutynin Rash    Past Surgical History:  Procedure Laterality Date  . ABDOMINAL HYSTERECTOMY  1999  . APPENDECTOMY    . BILATERAL SALPINGOOPHORECTOMY  2010  . CHOLECYSTECTOMY  2005  . COLONOSCOPY  2016    Social History   Tobacco Use  . Smoking status: Never Smoker  . Smokeless tobacco: Never Used  Substance Use Topics  . Alcohol use: No    Alcohol/week: 0.0 standard drinks  . Drug use: No  Medication list has been reviewed and updated.  Current Meds  Medication Sig  . albuterol (VENTOLIN HFA) 108 (90 Base) MCG/ACT inhaler INHALE 2 PUFFS BY MOUTH INTO THE LUNGS AS DIRECTED  . ARIPiprazole (ABILIFY) 5 MG tablet Take 1 tablet by mouth daily.  . clonazePAM (KLONOPIN) 1 MG tablet 2 (two) times daily as needed.   . cyclobenzaprine (FLEXERIL) 10 MG tablet TAKE ONE TABLET BY MOUTH 3 TIMES DAILY AS NEEDED FOR MUSCLE SPASM  . escitalopram (LEXAPRO) 20 MG tablet Take 20 mg by mouth daily.   . fluticasone  (FLONASE) 50 MCG/ACT nasal spray   . fluticasone (FLOVENT HFA) 220 MCG/ACT inhaler Inhale 1 puff into the lungs 2 (two) times daily.  . hydrOXYzine (ATARAX/VISTARIL) 25 MG tablet Take 2 tablets by mouth every 8 (eight) hours as needed.   Marland Kitchen losartan (COZAAR) 50 MG tablet TAKE 1 TABLET BY MOUTH EVERY DAY  . metFORMIN (GLUCOPHAGE) 500 MG tablet Take 3 tablets (1,500 mg total) by mouth daily. with food  . montelukast (SINGULAIR) 10 MG tablet TAKE 1 TABLET BY MOUTH EVERYDAY AT BEDTIME  . pantoprazole (PROTONIX) 40 MG tablet Take 1 tablet (40 mg total) by mouth 2 (two) times daily.  . promethazine (PHENERGAN) 25 MG tablet Take 1 tablet (25 mg total) by mouth 2 (two) times daily as needed.  . rizatriptan (MAXALT-MLT) 10 MG disintegrating tablet DISSOLVE 1 TABLET BY MOUTH AS NEEDED FORHEADACHE, MAY REPEAT IN 2 HOURS IF NEEDED.  Marland Kitchen rOPINIRole (REQUIP) 1 MG tablet TAKE 3 TABLETS BY MOUTH AT BEDTIME  . theophylline (UNIPHYL) 400 MG 24 hr tablet TAKE 1 TABLET BY MOUTH EVERY DAY  . traMADol (ULTRAM) 50 MG tablet Take 50 mg by mouth every 6 (six) hours as needed.     PHQ 2/9 Scores 10/23/2019 08/17/2019 02/16/2019 01/01/2019  PHQ - 2 Score 4 6 2 1   PHQ- 9 Score 5 11 2 4     GAD 7 : Generalized Anxiety Score 10/23/2019 08/17/2019  Nervous, Anxious, on Edge 2 3  Control/stop worrying 2 1  Worry too much - different things 2 1  Trouble relaxing 1 3  Restless 1 2  Easily annoyed or irritable 2 2  Afraid - awful might happen 0 0  Total GAD 7 Score 10 12  Anxiety Difficulty Somewhat difficult Not difficult at all    BP Readings from Last 3 Encounters:  10/23/19 116/78  08/17/19 124/84  02/16/19 (!) 155/95    Physical Exam Vitals and nursing note reviewed.  Constitutional:      General: She is not in acute distress.    Appearance: She is well-developed.  HENT:     Head: Normocephalic and atraumatic.  Pulmonary:     Effort: Pulmonary effort is normal. No respiratory distress.  Musculoskeletal:         General: Normal range of motion.  Skin:    General: Skin is warm and dry.     Findings: No rash.  Neurological:     Mental Status: She is alert and oriented to person, place, and time.  Psychiatric:        Behavior: Behavior normal.        Thought Content: Thought content normal.     Wt Readings from Last 3 Encounters:  10/23/19 138 lb (62.6 kg)  08/17/19 148 lb (67.1 kg)  01/01/19 158 lb (71.7 kg)    BP 116/78 (BP Location: Right Arm, Patient Position: Sitting, Cuff Size: Normal)   Pulse (!) 117   Ht 5'  1" (1.549 m)   Wt 138 lb (62.6 kg)   SpO2 98%   BMI 26.07 kg/m   Assessment and Plan: 1. Type II diabetes mellitus with complication (HCC) Continue metformin Start Trulicity 0.75 mg per week - sample for 2 weeks is given Call for refill of either 0.75 mg or 1.5 mg - metFORMIN (GLUCOPHAGE) 500 MG tablet; Take 3 tablets (1,500 mg total) by mouth daily. with food  Dispense: 270 tablet; Refill: 1  2. Restless legs syndrome - rOPINIRole (REQUIP) 1 MG tablet; Take 3 tablets (3 mg total) by mouth at bedtime.  Dispense: 270 tablet; Refill: 1  3. Moderate persistent asthma, unspecified whether complicated - theophylline (UNIPHYL) 400 MG 24 hr tablet; Take 1 tablet (400 mg total) by mouth daily.  Dispense: 90 tablet; Refill: 1 - montelukast (SINGULAIR) 10 MG tablet; TAKE 1 TABLET BY MOUTH EVERYDAY AT BEDTIME  Dispense: 90 tablet; Refill: 1  4. Encounter for screening mammogram for breast cancer Order given for [facility]    Partially dictated using Dragon software. Any errors are unintentional.  Bari Edward, MD [facility]  10/23/2019

## 2019-10-24 ENCOUNTER — Encounter: Payer: Self-pay | Admitting: Internal Medicine

## 2019-10-26 ENCOUNTER — Telehealth: Payer: Self-pay | Admitting: Internal Medicine

## 2019-10-26 ENCOUNTER — Other Ambulatory Visit: Payer: Self-pay | Admitting: Internal Medicine

## 2019-10-26 NOTE — Telephone Encounter (Signed)
Copied from CRM [id number]. Topic: General - Other >> Oct 26, 2019  9:11 AM Gwenlyn Fudge wrote: Reason for CRM: Pt called stating that she messed up on one of the trulicity injections she was supposed to be taking. Pt is requesting to have a sample box to take the second injection. Please advise.

## 2019-10-26 NOTE — Telephone Encounter (Signed)
Called pt told her that we have another sample to give her to stop by the office. Pt verbalized understanding.  KP

## 2019-11-04 ENCOUNTER — Telehealth: Payer: Self-pay | Admitting: Internal Medicine

## 2019-11-04 ENCOUNTER — Encounter: Payer: Self-pay | Admitting: Internal Medicine

## 2019-11-04 NOTE — Telephone Encounter (Signed)
Pt following up on mychart message sent this morning for abx to take care of possible UTI.Marland Kitchen Pt states she has pain and burning with urination. Pt declined appt, stating she was just in for appt not too long ago, and usually Dr will send something in for her.  [facility] [place] - [address] Phone:  [phone]  Fax:  [phone]

## 2019-11-04 NOTE — Telephone Encounter (Signed)
Patient was called by Delice Bison and informed she needs to make an appt or go to urgent care.   CM

## 2019-11-04 NOTE — Telephone Encounter (Signed)
Called pt and offered appt for UTI- she didn't want one, she will "call back"

## 2019-11-17 ENCOUNTER — Encounter: Payer: Self-pay | Admitting: Internal Medicine

## 2019-11-17 ENCOUNTER — Other Ambulatory Visit: Payer: Self-pay | Admitting: Internal Medicine

## 2019-11-17 DIAGNOSIS — E118 Type 2 diabetes mellitus with unspecified complications: Secondary | ICD-10-CM

## 2019-11-17 MED ORDER — TRULICITY 0.75 MG/0.5ML ~~LOC~~ SOAJ: 0.7500 mg | SUBCUTANEOUS | 3 refills | Status: DC

## 2019-12-04 ENCOUNTER — Other Ambulatory Visit: Payer: Self-pay | Admitting: Internal Medicine

## 2019-12-04 DIAGNOSIS — S46909A Unspecified injury of unspecified muscle, fascia and tendon at shoulder and upper arm level, unspecified arm, initial encounter: Secondary | ICD-10-CM

## 2019-12-04 NOTE — Telephone Encounter (Signed)
Requested medication (s) are due for refill today - may be too soon  Requested medication (s) are on the active medication list -yes  Future visit scheduled -yes  Last refill: 11/06/19  Notes to clinic: Request for non delegated Rx  Requested Prescriptions  Pending Prescriptions Disp Refills   cyclobenzaprine (FLEXERIL) 10 MG tablet [Pharmacy Med Name: CYCLOBENZAPRINE HCL 10 MG TAB] 30 tablet 2    Sig: TAKE ONE TABLET BY MOUTH 3 TIMES DAILY AS NEEDED FOR MUSCLE SPASM      Not Delegated - Analgesics:  Muscle Relaxants Failed - 12/04/2019  9:56 AM      Failed - This refill cannot be delegated      Passed - Valid encounter within last 6 months    Recent Outpatient Visits           1 month ago Type II diabetes mellitus with complication [facility])   [facility] Reubin Milan, MD   3 months ago Annual physical exam   [facility] Reubin Milan, MD   9 months ago Asthma exacerbation, mild   [facility] Re[facility], [facility]   11 months ago Acute non-recurrent maxillary sinusitis   [facility] Re[facility], [facility]   1 year ago Type II diabetes mellitus with complication [facility])   [facility] Reubin Milan, MD       Future Appointments             In 2 months Reubin Milan, MD [facility]             Signed Prescriptions Disp Refills   rizatriptan (MAXALT-MLT) 10 MG disintegrating tablet 6 tablet 0    Sig: DISSOLVE 1 TABLET BY MOUTH AS NEEDED FORHEADACHE, MAY REPEAT IN 2 HOURS IF NEEDED.      Neurology:  Migraine Therapy - Triptan Passed - 12/04/2019  9:56 AM      Passed - Last BP in normal range    BP Readings from Last 1 Encounters:  10/23/19 116/78          Passed - Valid encounter within last 12 months    Recent Outpatient Visits           1 month ago Type II diabetes mellitus with complication [facility])   [facility] Reubin Milan, MD   3 months ago Annual physical exam    [facility] Reubin Milan, MD   9 months ago Asthma exacerbation, mild   [facility] Reubin Milan, MD   11 months ago Acute non-recurrent maxillary sinusitis   [facility] Reubin Milan, MD   1 year ago Type II diabetes mellitus with complication [facility])   [facility] Reubin Milan, MD       Future Appointments             In 2 months [name] [name], MD [facility], [facility]                Requested Prescriptions  Pending Prescriptions Disp Refills   cyclobenzaprine (FLEXERIL) 10 MG tablet [Pharmacy Med Name: CYCLOBENZAPRINE HCL 10 MG TAB] 30 tablet 2    Sig: TAKE ONE TABLET BY MOUTH 3 TIMES DAILY AS NEEDED FOR MUSCLE SPASM      Not Delegated - Analgesics:  Muscle Relaxants Failed - 12/04/2019  9:56 AM      Failed - This refill cannot be delegated  Passed - Valid encounter within last 6 months    Recent Outpatient Visits           1 month ago Type II diabetes mellitus with complication [facility])   [facility] Reubin Milan, MD   3 months ago Annual physical exam   [facility] Reubin Milan, MD   9 months ago Asthma exacerbation, mild   [facility] Reubin Milan, MD   11 months ago Acute non-recurrent maxillary sinusitis   [facility] Reubin Milan, MD   1 year ago Type II diabetes mellitus with complication [facility])   [facility] Reubin Milan, MD       Future Appointments             In 2 months Reubin Milan, MD [facility], [facility]             Signed Prescriptions Disp Refills   rizatriptan (MAXALT-MLT) 10 MG disintegrating tablet 6 tablet 0    Sig: DISSOLVE 1 TABLET BY MOUTH AS NEEDED FORHEADACHE, MAY REPEAT IN 2 HOURS IF NEEDED.      Neurology:  Migraine Therapy - Triptan Passed - 12/04/2019  9:56 AM      Passed - Last BP in normal range    BP Readings from Last 1 Encounters:  10/23/19 116/78           Passed - Valid encounter within last 12 months    Recent Outpatient Visits           1 month ago Type II diabetes mellitus with complication [facility])   [facility] Reubin Milan, MD   3 months ago Annual physical exam   [facility] Reubin Milan, MD   9 months ago Asthma exacerbation, mild   [facility] Reubin Milan, MD   11 months ago Acute non-recurrent maxillary sinusitis   [facility] Reubin Milan, MD   1 year ago Type II diabetes mellitus with complication [facility])   [facility] Reubin Milan, MD       Future Appointments             In 2 months [name] [name], MD [facility], [facility]

## 2019-12-09 ENCOUNTER — Encounter: Payer: Self-pay | Admitting: Internal Medicine

## 2019-12-10 ENCOUNTER — Telehealth: Payer: Self-pay | Admitting: Internal Medicine

## 2019-12-10 NOTE — Telephone Encounter (Signed)
Copied from CRM [id number]. Topic: Appointment Scheduling - Scheduling Inquiry for Clinic >> Dec 10, 2019  9:09 AM Crist Infante wrote: Reason for CRM: pt following up on mychart message about her sugars up and down. Pt concerned that Dr Asencion Partridge may want to change her insulin, and she doesn't want to pick up Rx and have to pay again for another Rx.  Pt aware Dr Asencion Partridge is out this week.  Would like to know if Dr Yetta Barre wants to address this issue.  Pt has one shot left.

## 2019-12-25 ENCOUNTER — Ambulatory Visit: Payer: BLUE CROSS/BLUE SHIELD | Admitting: Internal Medicine

## 2019-12-29 ENCOUNTER — Other Ambulatory Visit: Payer: Self-pay | Admitting: Internal Medicine

## 2019-12-29 ENCOUNTER — Telehealth: Payer: BLUE CROSS/BLUE SHIELD | Admitting: Internal Medicine

## 2019-12-29 DIAGNOSIS — G43009 Migraine without aura, not intractable, without status migrainosus: Secondary | ICD-10-CM

## 2019-12-29 NOTE — Telephone Encounter (Signed)
Please advise. Last office visit 10/23/2019.  KP

## 2019-12-29 NOTE — Telephone Encounter (Signed)
Requested medication (s) are due for refill today -yes  Requested medication (s) are on the active medication list -yes  Future visit scheduled -yes-today  Last refill: 10/02/19  Notes to clinic: Request for non delegated Rx  Requested Prescriptions  Pending Prescriptions Disp Refills   promethazine (PHENERGAN) 25 MG tablet [Pharmacy Med Name: PROMETHAZINE HCL 25 MG TAB] 180 tablet 0    Sig: TAKE 1 TABLET BY MOUTH TWICE DAILY AS NEEDED      Not Delegated - Gastroenterology: Antiemetics Failed - 12/29/2019  9:41 AM      Failed - This refill cannot be delegated      Passed - Valid encounter within last 6 months    Recent Outpatient Visits           2 months ago Type II diabetes mellitus with complication [facility])   [facility] Reubin Milan, MD   4 months ago Annual physical exam   [facility] Reubin Milan, MD   10 months ago Asthma exacerbation, mild   [facility] Re[facility], MD   12 months ago Acute non-recurrent maxillary sinusitis   [facility] Reubin Milan, MD   1 year ago Type II diabetes mellitus with complication [facility])   [facility] Reubin Milan, MD       Future Appointments             In 1 month [name], [name], MD [facility], [facility]                Requested Prescriptions  Pending Prescriptions Disp Refills   promethazine (PHENERGAN) 25 MG tablet [Pharmacy Med Name: PROMETHAZINE HCL 25 MG TAB] 180 tablet 0    Sig: TAKE 1 TABLET BY MOUTH TWICE DAILY AS NEEDED      Not Delegated - Gastroenterology: Antiemetics Failed - 12/29/2019  9:41 AM      Failed - This refill cannot be delegated      Passed - Valid encounter within last 6 months    Recent Outpatient Visits           2 months ago Type II diabetes mellitus with complication [facility])   [facility] Reubin Milan, MD   4 months ago Annual physical exam   [facility] Reubin Milan, MD   10  months ago Asthma exacerbation, mild   [facility] Reubin Milan, MD   12 months ago Acute non-recurrent maxillary sinusitis   [facility] Re[facility], MD   1 year ago Type II diabetes mellitus with complication [facility])   [facility] Reubin Milan, MD       Future Appointments             In 1 month [name], [name], MD [facility], [facility]

## 2019-12-30 ENCOUNTER — Other Ambulatory Visit: Payer: Self-pay | Admitting: Internal Medicine

## 2020-01-19 LAB — HM MAMMOGRAPHY

## 2020-02-25 ENCOUNTER — Ambulatory Visit: Payer: BLUE CROSS/BLUE SHIELD | Admitting: Internal Medicine

## 2020-03-01 ENCOUNTER — Ambulatory Visit (INDEPENDENT_AMBULATORY_CARE_PROVIDER_SITE_OTHER): Payer: BLUE CROSS/BLUE SHIELD | Admitting: Internal Medicine

## 2020-03-01 ENCOUNTER — Encounter: Payer: Self-pay | Admitting: Internal Medicine

## 2020-03-01 ENCOUNTER — Other Ambulatory Visit: Payer: Self-pay

## 2020-03-01 VITALS — BP 114/68 | HR 91 | Temp 98.4°F | Ht 61.0 in | Wt 158.0 lb

## 2020-03-01 DIAGNOSIS — I10 Essential (primary) hypertension: Secondary | ICD-10-CM | POA: Diagnosis not present

## 2020-03-01 DIAGNOSIS — G43009 Migraine without aura, not intractable, without status migrainosus: Secondary | ICD-10-CM

## 2020-03-01 DIAGNOSIS — E118 Type 2 diabetes mellitus with unspecified complications: Secondary | ICD-10-CM

## 2020-03-01 DIAGNOSIS — S46909A Unspecified injury of unspecified muscle, fascia and tendon at shoulder and upper arm level, unspecified arm, initial encounter: Secondary | ICD-10-CM | POA: Diagnosis not present

## 2020-03-01 DIAGNOSIS — Z90710 Acquired absence of both cervix and uterus: Secondary | ICD-10-CM | POA: Insufficient documentation

## 2020-03-01 DIAGNOSIS — G2581 Restless legs syndrome: Secondary | ICD-10-CM

## 2020-03-01 DIAGNOSIS — K219 Gastro-esophageal reflux disease without esophagitis: Secondary | ICD-10-CM

## 2020-03-01 DIAGNOSIS — Z1159 Encounter for screening for other viral diseases: Secondary | ICD-10-CM

## 2020-03-01 DIAGNOSIS — J4540 Moderate persistent asthma, uncomplicated: Secondary | ICD-10-CM

## 2020-03-01 MED ORDER — THEOPHYLLINE ER 400 MG PO TB24: 400.0000 mg | ORAL_TABLET | Freq: Every day | ORAL | 1 refills | Status: DC

## 2020-03-01 MED ORDER — RIZATRIPTAN BENZOATE 10 MG PO TBDP: ORAL_TABLET | ORAL | 0 refills | Status: DC

## 2020-03-01 MED ORDER — LOSARTAN POTASSIUM 50 MG PO TABS: 50.0000 mg | ORAL_TABLET | Freq: Every day | ORAL | 4 refills | Status: DC

## 2020-03-01 MED ORDER — CYCLOBENZAPRINE HCL 10 MG PO TABS: 10.0000 mg | ORAL_TABLET | Freq: Three times a day (TID) | ORAL | 2 refills | Status: DC

## 2020-03-01 MED ORDER — ROPINIROLE HCL 1 MG PO TABS: 3.0000 mg | ORAL_TABLET | Freq: Every day | ORAL | 5 refills | Status: DC

## 2020-03-01 MED ORDER — PANTOPRAZOLE SODIUM 40 MG PO TBEC: 40.0000 mg | DELAYED_RELEASE_TABLET | Freq: Two times a day (BID) | ORAL | 1 refills | Status: DC

## 2020-03-01 NOTE — Progress Notes (Signed)
Date:  03/01/2020   Name:  Crystal Rich   DOB:  06/28/1965   MRN:  [id number]   Chief Complaint: Diabetes (A1C last reading this afternoon- 139)  Diabetes She presents for her follow-up diabetic visit. She has type 2 diabetes mellitus. Her disease course has been improving. Hypoglycemia symptoms include headaches (occasional migraine). Pertinent negatives for hypoglycemia include no dizziness or nervousness/anxiousness. Pertinent negatives for diabetes include no chest pain and no fatigue. Symptoms are improving. Current diabetic treatment includes oral agent (monotherapy) (and Trulicity added last visit). She is compliant with treatment all of the time. Her weight is decreasing steadily (has lost 10 lbs). An ACE inhibitor/angiotensin II receptor blocker is being taken. Eye exam is current.    Lab Results  Component Value Date   CREATININE 0.80 08/17/2019   BUN 9 08/17/2019   NA 137 08/17/2019   K 4.8 08/17/2019   CL 97 08/17/2019   CO2 24 08/17/2019   Lab Results  Component Value Date   CHOL 155 08/17/2019   HDL 44 08/17/2019   LDLCALC 79 08/17/2019   TRIG 187 (H) 08/17/2019   CHOLHDL 3.5 08/17/2019   Lab Results  Component Value Date   TSH 2.380 08/17/2019   Lab Results  Component Value Date   HGBA1C 8.8 (H) 08/17/2019   Lab Results  Component Value Date   WBC 8.1 08/17/2019   HGB 14.5 08/17/2019   HCT 43.1 08/17/2019   MCV 81 08/17/2019   PLT 307 08/17/2019   Lab Results  Component Value Date   ALT 32 08/17/2019   AST 34 08/17/2019   ALKPHOS 129 (H) 08/17/2019   BILITOT 0.3 08/17/2019     Review of Systems  Constitutional: Negative for chills, fatigue and fever.  Respiratory: Positive for wheezing (intermittent asthma). Negative for cough, chest tightness and shortness of breath.   Cardiovascular: Negative for chest pain, palpitations and leg swelling.  Gastrointestinal: Positive for abdominal pain and nausea. Negative for blood in stool, diarrhea  and vomiting.  Skin: Negative for color change and rash.  Neurological: Positive for headaches (occasional migraine). Negative for dizziness and numbness.  Psychiatric/Behavioral: Positive for dysphoric mood. The patient is not nervous/anxious.     Patient Active Problem List   Diagnosis Date Noted  . Status post total hysterectomy and bilateral salpingo-oophorectomy 03/01/2020  . Gastroesophageal reflux disease without esophagitis 12/24/2017  . History of Clostridium difficile colitis 04/04/2017  . Hemorrhoids 01/31/2017  . Restless legs syndrome 12/31/2016  . Essential hypertension 12/31/2016  . Lymphadenopathy, axillary 04/25/2015  . Type II diabetes mellitus with complication (HCC) 04/25/2015  . Insomnia 03/11/2015  . Chronic abdominal pain 11/17/2014  . C. difficile colitis 11/17/2014  . Anxiety, generalized 11/17/2014  . 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 Hot flash, menopausal 11/17/2014  . Migraine without aura and responsive to treatment 11/17/2014  . Asthma, moderate persistent 11/17/2014  . Abdominal pain 04/02/2012    Allergies  Allergen Reactions  . Nsaids Shortness Of Breath  . Azithromycin Rash  . Januvia [Sitagliptin] Nausea And Vomiting  . Sulfa Antibiotics Rash  . Aspirin   . Ketorolac     Other reaction(s): Other (See Comments) Patient states Burning sensation inside Other reaction(s): Other (See Comments) Patient states Burning sensation inside  . Morphine Sulfate Nausea And Vomiting  . Nalbuphine     Other reaction(s): Other (See Comments) Burning on inside  . Prochlorperazine Edisylate Other (See Comments)    tachycardia  .  Trazodone Cough  . Bupropion Rash  . Oxybutynin Rash    Past Surgical History:  Procedure Laterality Date  . ABDOMINAL HYSTERECTOMY  1999  . APPENDECTOMY    . BILATERAL SALPINGOOPHORECTOMY  2010  . CHOLECYSTECTOMY  2005  . COLONOSCOPY  2016    Social History   Tobacco Use  .  Smoking status: Never Smoker  . Smokeless tobacco: Never Used  Substance Use Topics  . Alcohol use: No    Alcohol/week: 0.0 standard drinks  . Drug use: No     Medication list has been reviewed and updated.  Current Meds  Medication Sig  . albuterol (VENTOLIN HFA) 108 (90 Base) MCG/ACT inhaler INHALE 2 PUFFS BY MOUTH INTO THE LUNGS AS DIRECTED  . clonazePAM (KLONOPIN) 1 MG tablet 2 (two) times daily as needed.   . cyclobenzaprine (FLEXERIL) 10 MG tablet TAKE ONE TABLET BY MOUTH 3 TIMES DAILY AS NEEDED FOR MUSCLE SPASM  . Dulaglutide (TRULICITY) 0.75 MG/0.5ML SOPN Inject 0.5 mLs (0.75 mg total) into the skin once a week.  . escitalopram (LEXAPRO) 20 MG tablet Take 30 mg by mouth daily.   . fluticasone (FLONASE) 50 MCG/ACT nasal spray as needed.   . fluticasone (FLOVENT HFA) 220 MCG/ACT inhaler Inhale 1 puff into the lungs 2 (two) times daily.  Marland Kitchen gabapentin (NEURONTIN) 600 MG tablet Take 600 mg by mouth at bedtime.  . hydrOXYzine (ATARAX/VISTARIL) 25 MG tablet Take 2 tablets by mouth every 8 (eight) hours as needed.   Marland Kitchen losartan (COZAAR) 50 MG tablet TAKE 1 TABLET BY MOUTH EVERY DAY  . metFORMIN (GLUCOPHAGE) 500 MG tablet Take 1,000 mg by mouth daily.   . metoCLOPramide (REGLAN) 10 MG tablet   . montelukast (SINGULAIR) 10 MG tablet TAKE 1 TABLET BY MOUTH EVERYDAY AT BEDTIME  . pantoprazole (PROTONIX) 40 MG tablet Take 1 tablet (40 mg total) by mouth 2 (two) times daily.  . promethazine (PHENERGAN) 25 MG tablet TAKE 1 TABLET BY MOUTH TWICE DAILY AS NEEDED  . rizatriptan (MAXALT-MLT) 10 MG disintegrating tablet DISSOLVE 1 TABLET BY MOUTH AS NEEDED FORHEADACHE, MAY REPEAT IN 2 HOURS IF NEEDED.  Marland Kitchen rOPINIRole (REQUIP) 1 MG tablet Take 3 tablets (3 mg total) by mouth at bedtime. (Patient taking differently: Take 3 mg by mouth at bedtime. )  . theophylline (UNIPHYL) 400 MG 24 hr tablet Take 1 tablet (400 mg total) by mouth daily.  . traMADol (ULTRAM) 50 MG tablet Take 50 mg by mouth every 6  (six) hours as needed.     PHQ 2/9 Scores 03/01/2020 10/23/2019 08/17/2019 02/16/2019  PHQ - 2 Score 4 4 6 2   PHQ- 9 Score 13 5 11 2     GAD 7 : Generalized Anxiety Score 03/01/2020 10/23/2019 08/17/2019  Nervous, Anxious, on Edge 2 2 3   Control/stop worrying 0 2 1  Worry too much - different things 0 2 1  Trouble relaxing 2 1 3   Restless 2 1 2   Easily annoyed or irritable 0 2 2  Afraid - awful might happen 0 0 0  Total GAD 7 Score 6 10 12   Anxiety Difficulty Somewhat difficult Somewhat difficult Not difficult at all    BP Readings from Last 3 Encounters:  03/01/20 114/68  10/23/19 116/78  08/17/19 124/84    Physical Exam Vitals and nursing note reviewed.  Constitutional:      General: She is not in acute distress.    Appearance: Normal appearance. She is well-developed.  HENT:     Head: Normocephalic  and atraumatic.  Neck:     Vascular: No carotid bruit.  Cardiovascular:     Rate and Rhythm: Normal rate and regular rhythm.     Pulses: Normal pulses.     Heart sounds: No murmur heard.   Pulmonary:     Effort: Pulmonary effort is normal. No respiratory distress.     Breath sounds: No wheezing or rhonchi.  Musculoskeletal:     Cervical back: Normal range of motion.     Right lower leg: No edema.     Left lower leg: No edema.  Lymphadenopathy:     Cervical: No cervical adenopathy.  Skin:    General: Skin is warm and dry.     Findings: No rash.  Neurological:     General: No focal deficit present.     Mental Status: She is alert and oriented to person, place, and time.  Psychiatric:        Mood and Affect: Mood normal.     Wt Readings from Last 3 Encounters:  03/01/20 158 lb (71.7 kg)  10/23/19 138 lb (62.6 kg)  08/17/19 148 lb (67.1 kg)    BP 114/68 (BP Location: Right Arm, Patient Position: Sitting)   Pulse 91   Temp 98.4 F (36.9 C) (Oral)   Ht 5\' 1"  (1.549 m)   Wt 158 lb (71.7 kg)   SpO2 97%   BMI 29.85 kg/m   Assessment and Plan: 1. Type II  diabetes mellitus with complication (HCC) Improving BS with the addition of Trulicity 0.75 mg Will check A1C and if less than 8, will continue same dose, otherwise increase to 1.5mg  - Hemoglobin A1c  2. Need for hepatitis C screening test - Hepatitis C antibody  3. Injury of muscle of shoulder - cyclobenzaprine (FLEXERIL) 10 MG tablet; Take 1 tablet (10 mg total) by mouth 3 (three) times daily.  Dispense: 30 tablet; Refill: 2  4. Essential hypertension Clinically stable exam with well controlled BP on losartan. Tolerating medications without side effects at this time. Pt to continue current regimen and low sodium diet; benefits of regular exercise as able discussed. - losartan (COZAAR) 50 MG tablet; Take 1 tablet (50 mg total) by mouth daily.  Dispense: 30 tablet; Refill: 4  5. Restless legs syndrome - rOPINIRole (REQUIP) 1 MG tablet; Take 3 tablets (3 mg total) by mouth at bedtime.  Dispense: 90 tablet; Refill: 5  6. Moderate persistent asthma, unspecified whether complicated - theophylline (UNIPHYL) 400 MG 24 hr tablet; Take 1 tablet (400 mg total) by mouth daily.  Dispense: 90 tablet; Refill: 1  7. Migraine without aura and responsive to treatment - rizatriptan (MAXALT-MLT) 10 MG disintegrating tablet; May repeat in 2 hours if needed  Dispense: 6 tablet; Refill: 0  8. Gastroesophageal reflux disease without esophagitis - pantoprazole (PROTONIX) 40 MG tablet; Take 1 tablet (40 mg total) by mouth 2 (two) times daily.  Dispense: 180 tablet; Refill: 1   Partially dictated using . Any errors are unintentional.  Animal nutritionist, MD [facility]  03/01/2020

## 2020-03-02 LAB — HEMOGLOBIN A1C
Est. average glucose Bld gHb Est-mCnc: 154 mg/dL
Hgb A1c MFr Bld: 7 % — ABNORMAL HIGH (ref 4.8–5.6)

## 2020-03-02 LAB — HEPATITIS C ANTIBODY: Hep C Virus Ab: <0.1 s/co ratio (ref 0.0–0.9)

## 2020-03-04 ENCOUNTER — Other Ambulatory Visit: Payer: Self-pay | Admitting: Internal Medicine

## 2020-03-04 ENCOUNTER — Telehealth: Payer: Self-pay

## 2020-03-04 DIAGNOSIS — E118 Type 2 diabetes mellitus with unspecified complications: Secondary | ICD-10-CM

## 2020-03-04 MED ORDER — TRULICITY 0.75 MG/0.5ML ~~LOC~~ SOAJ: 0.7500 mg | SUBCUTANEOUS | 1 refills | Status: DC

## 2020-03-04 NOTE — Telephone Encounter (Signed)
Called pt went over lab results from 03/01/2020.  KP

## 2020-03-04 NOTE — Telephone Encounter (Unsigned)
Copied from CRM [id number]. Topic: General - Other >> Mar 04, 2020 12:04 PM Tamela Oddi wrote: Reason for CRM: Patient would like the nurse to call her back because last week when she called patient, the patient could not hear her clearly and would like a call back to follow up.  Please advise and call patient at [phone]

## 2020-03-04 NOTE — Telephone Encounter (Signed)
Pt states she saw Dr Asencion Partridge a few days ago, and was supposed to have Dulaglutide (TRULICITY) 0.75 MG/0.5ML SOPN   called into pharmacy. Please send to  [facility] [place] - [address] Phone:  [phone]  Fax:  [phone]

## 2020-04-05 ENCOUNTER — Other Ambulatory Visit: Payer: Self-pay | Admitting: Internal Medicine

## 2020-04-05 DIAGNOSIS — G43009 Migraine without aura, not intractable, without status migrainosus: Secondary | ICD-10-CM

## 2020-04-05 NOTE — Telephone Encounter (Signed)
Requested Prescriptions  Pending Prescriptions Disp Refills   rizatriptan (MAXALT-MLT) 10 MG disintegrating tablet [Pharmacy Med Name: RIZATRIPTAN BENZOATE 10 MG ODT] 6 tablet 2    Sig: TAKE 1 TABLET BY MOUTH AND MAY REPEAT IN2 HOURS IF NEEDED     Neurology:  Migraine Therapy - Triptan Passed - 04/05/2020  8:59 AM      Passed - Last BP in normal range    BP Readings from Last 1 Encounters:  03/01/20 114/68         Passed - Valid encounter within last 12 months    Recent Outpatient Visits          1 month ago Type II diabetes mellitus with complication [facility])   [facility] Reubin Milan, MD   5 months ago Type II diabetes mellitus with complication [facility])   [facility] Reubin Milan, MD   7 months ago Annual physical exam   [facility] Reubin Milan, MD   1 year ago Asthma exacerbation, mild   [facility] Reubin Milan, MD   1 year ago Acute non-recurrent maxillary sinusitis   [facility] Re[facility], [facility]      Future Appointments            In 3 months [name] [name], MD [facility], [facility]

## 2020-04-28 ENCOUNTER — Other Ambulatory Visit: Payer: Self-pay | Admitting: Internal Medicine

## 2020-04-28 DIAGNOSIS — E118 Type 2 diabetes mellitus with unspecified complications: Secondary | ICD-10-CM

## 2020-04-28 NOTE — Telephone Encounter (Signed)
Requested Prescriptions  Pending Prescriptions Disp Refills   TRULICITY 0.75 MG/0.5ML SOPN [Pharmacy Med Name: TRULICITY 0.75 MG/0.5ML SUBQ SOLN M] 2 mL 4    Sig: INJECT 0.75MG  SUBCUTANEOUSLY ONCE A WEEK     Endocrinology:  Diabetes - GLP-1 Receptor Agonists Passed - 04/28/2020 11:31 AM      Passed - HBA1C is between 0 and 7.9 and within 180 days    Hgb A1c MFr Bld  Date Value Ref Range Status  03/01/2020 7.0 (H) 4.8 - 5.6 % Final    Comment:             Prediabetes: 5.7 - 6.4          Diabetes: >6.4          Glycemic control for adults with diabetes: <7.0          Passed - Valid encounter within last 6 months    Recent Outpatient Visits          1 month ago Type II diabetes mellitus with complication [facility])   [facility] Reubin Milan, MD   6 months ago Type II diabetes mellitus with complication [facility])   [facility] Reubin Milan, MD   8 months ago Annual physical exam   [facility] Reubin Milan, MD   1 year ago Asthma exacerbation, mild   [facility] Reubin Milan, MD   1 year ago Acute non-recurrent maxillary sinusitis   [facility] Reubin Milan, MD      Future Appointments            In 2 months [name] [name], MD [facility], [facility]

## 2020-06-16 ENCOUNTER — Other Ambulatory Visit: Payer: Self-pay

## 2020-06-16 MED ORDER — RIZATRIPTAN BENZOATE 10 MG PO TABS: 10.0000 mg | ORAL_TABLET | ORAL | 3 refills | Status: DC | PRN

## 2020-06-24 ENCOUNTER — Telehealth: Payer: Self-pay

## 2020-06-24 NOTE — Telephone Encounter (Signed)
Noted and Informed Dr. [name] verbally.

## 2020-06-24 NOTE — Telephone Encounter (Unsigned)
Copied from CRM [id number]. Topic: General - Inquiry >> Jun 24, 2020 11:58 AM Crist Infante wrote: Reason for CRM: pt states she is going to stay with Dr Asencion Partridge for now as a pcp.  She will call next week for appt, she is just getting over covid.

## 2020-06-28 ENCOUNTER — Other Ambulatory Visit: Payer: Self-pay | Admitting: Internal Medicine

## 2020-06-28 ENCOUNTER — Telehealth: Payer: Self-pay

## 2020-06-28 DIAGNOSIS — S46909A Unspecified injury of unspecified muscle, fascia and tendon at shoulder and upper arm level, unspecified arm, initial encounter: Secondary | ICD-10-CM

## 2020-06-28 NOTE — Telephone Encounter (Signed)
Requested medication (s) are due for refill today: yes  Requested medication (s) are on the active medication list: yes  Last refill:  06/01/2020  Future visit scheduled: no  Notes to clinic:  this refill cannot be delegated    Requested Prescriptions  Pending Prescriptions Disp Refills   cyclobenzaprine (FLEXERIL) 10 MG tablet [Pharmacy Med Name: CYCLOBENZAPRINE HCL 10 MG TAB] 30 tablet 2    Sig: TAKE 1 TABLET BY MOUTH 3 TIMES DAILY      Not Delegated - Analgesics:  Muscle Relaxants Failed - 06/28/2020  9:50 AM      Failed - This refill cannot be delegated      Passed - Valid encounter within last 6 months    Recent Outpatient Visits           3 months ago Type II diabetes mellitus with complication [facility])   [facility] Reubin Milan, MD   8 months ago Type II diabetes mellitus with complication [facility])   [facility] Reubin Milan, MD   10 months ago Annual physical exam   [facility] Reubin Milan, MD   1 year ago Asthma exacerbation, mild   [facility] Reubin Milan, MD   1 year ago Acute non-recurrent maxillary sinusitis   [facility] Reubin Milan, MD

## 2020-06-28 NOTE — Telephone Encounter (Signed)
Patient will call back to schedule an appointment. 

## 2020-06-30 NOTE — Telephone Encounter (Signed)
error 

## 2020-07-04 ENCOUNTER — Ambulatory Visit: Payer: BLUE CROSS/BLUE SHIELD | Admitting: Internal Medicine

## 2020-07-06 ENCOUNTER — Other Ambulatory Visit: Payer: Self-pay | Admitting: Internal Medicine

## 2020-07-06 NOTE — Telephone Encounter (Signed)
}  Notes to clinic:  medication was filled by a historical provider   Review for refill    Requested Prescriptions  Pending Prescriptions Disp Refills   metFORMIN (GLUCOPHAGE) 500 MG tablet [Pharmacy Med Name: METFORMIN HCL 500 MG TAB] 270 tablet     Sig: TAKE 3 TABLETS BY MOUTH DAILY WITH FOOD      Endocrinology:  Diabetes - Biguanides Passed - 07/06/2020 11:37 AM      Passed - Cr in normal range and within 360 days    Creatinine, Ser  Date Value Ref Range Status  08/17/2019 0.80 0.57 - 1.00 mg/dL Final          Passed - HBA1C is between 0 and 7.9 and within 180 days    Hgb A1c MFr Bld  Date Value Ref Range Status  03/01/2020 7.0 (H) 4.8 - 5.6 % Final    Comment:             Prediabetes: 5.7 - 6.4          Diabetes: >6.4          Glycemic control for adults with diabetes: <7.0           Passed - eGFR in normal range and within 360 days    GFR calc Af Amer  Date Value Ref Range Status  08/17/2019 98 >59 mL/min/1.73 Final   GFR calc non Af Amer  Date Value Ref Range Status  08/17/2019 85 >59 mL/min/1.73 Final          Passed - Valid encounter within last 6 months    Recent Outpatient Visits           4 months ago Type II diabetes mellitus with complication [facility])   [facility] Glean Hess, MD   8 months ago Type II diabetes mellitus with complication [facility])   [facility] Glean Hess, MD   10 months ago Annual physical exam   [facility] Glean Hess, MD   1 year ago Asthma exacerbation, mild   [facility]   1 year ago Acute non-recurrent maxillary sinusitis   [facility] Glean [facility], [facility]       Future Appointments             In 4 weeks Army Melia Jesse Sans, MD [facility], [facility]

## 2020-07-25 ENCOUNTER — Other Ambulatory Visit: Payer: Self-pay | Admitting: Internal Medicine

## 2020-07-25 DIAGNOSIS — S46909A Unspecified injury of unspecified muscle, fascia and tendon at shoulder and upper arm level, unspecified arm, initial encounter: Secondary | ICD-10-CM

## 2020-07-25 NOTE — Telephone Encounter (Signed)
Requested medication (s) are due for refill today: yes  Requested medication (s) are on the active medication list:  yes  Last refill:  06/28/2020  Future visit scheduled: yes  Notes to clinic:  this refill cannot be delegated    Requested Prescriptions  Pending Prescriptions Disp Refills   cyclobenzaprine (FLEXERIL) 10 MG tablet [Pharmacy Med Name: CYCLOBENZAPRINE HCL 10 MG TAB] 30 tablet 0    Sig: TAKE 1 TABLET BY MOUTH 3 TIMES DAILY      Not Delegated - Analgesics:  Muscle Relaxants Failed - 07/25/2020 10:47 AM      Failed - This refill cannot be delegated      Passed - Valid encounter within last 6 months    Recent Outpatient Visits           4 months ago Type II diabetes mellitus with complication [facility])   [facility] Reubin Milan, MD   9 months ago Type II diabetes mellitus with complication [facility])   [facility] Reubin Milan, MD   11 months ago Annual physical exam   [facility] Reubin Milan, MD   1 year ago Asthma exacerbation, mild   [facility] Reubin Milan, MD   1 year ago Acute non-recurrent maxillary sinusitis   [facility] Reubin Milan, MD       Future Appointments             In 1 week [name] [name], MD [facility], [facility]

## 2020-08-04 ENCOUNTER — Ambulatory Visit (INDEPENDENT_AMBULATORY_CARE_PROVIDER_SITE_OTHER): Payer: BLUE CROSS/BLUE SHIELD | Admitting: Internal Medicine

## 2020-08-04 ENCOUNTER — Encounter: Payer: Self-pay | Admitting: Internal Medicine

## 2020-08-04 ENCOUNTER — Other Ambulatory Visit: Payer: Self-pay

## 2020-08-04 VITALS — BP 122/82 | HR 97 | Temp 98.3°F | Ht 61.0 in | Wt 160.0 lb

## 2020-08-04 DIAGNOSIS — I10 Essential (primary) hypertension: Secondary | ICD-10-CM | POA: Diagnosis not present

## 2020-08-04 DIAGNOSIS — M62830 Muscle spasm of back: Secondary | ICD-10-CM | POA: Diagnosis not present

## 2020-08-04 DIAGNOSIS — J4540 Moderate persistent asthma, uncomplicated: Secondary | ICD-10-CM

## 2020-08-04 DIAGNOSIS — E118 Type 2 diabetes mellitus with unspecified complications: Secondary | ICD-10-CM

## 2020-08-04 DIAGNOSIS — F3341 Major depressive disorder, recurrent, in partial remission: Secondary | ICD-10-CM

## 2020-08-04 LAB — POCT GLYCOSYLATED HEMOGLOBIN (HGB A1C): Hemoglobin A1C: 7.1 % — AB (ref 4.0–5.6)

## 2020-08-04 MED ORDER — MONTELUKAST SODIUM 10 MG PO TABS: ORAL_TABLET | ORAL | 1 refills | Status: DC

## 2020-08-04 MED ORDER — CYCLOBENZAPRINE HCL 10 MG PO TABS: 10.0000 mg | ORAL_TABLET | Freq: Three times a day (TID) | ORAL | 0 refills | Status: DC

## 2020-08-04 NOTE — Progress Notes (Signed)
Date:  08/04/2020   Name:  Crystal Rich   DOB:  08-16-1965   MRN:  [id number]   Chief Complaint: Diabetes (May have missed trulicity due to having brain fog during covid, but has been taking it for the past month ), Hypertension, and Back Pain (X3 months ago,Was weak during to having covid, went to help son and fell, mid back right side down to lower back, comes and goes )  Diabetes She presents for her follow-up diabetic visit. She has type 2 diabetes mellitus. Her disease course has been stable. Pertinent negatives for hypoglycemia include no headaches or tremors. There are no diabetic associated symptoms. Pertinent negatives for diabetes include no chest pain, no fatigue, no polydipsia and no polyuria. Current diabetic treatment includes oral agent (monotherapy) (metformin plus Trulicity). An ACE inhibitor/angiotensin II receptor blocker is being taken.  Hypertension This is a chronic problem. The problem is controlled. Pertinent negatives include no chest pain, headaches, palpitations or shortness of breath. Past treatments include angiotensin blockers. The current treatment provides significant improvement.  Back Pain This is a new problem. The current episode started more than 1 month ago. The problem occurs daily. The problem is unchanged. Pain location: along right side of thoracic spine. Pertinent negatives include no abdominal pain, chest pain, dysuria, fever, headaches or numbness.  Depression        This is a chronic problem.  The problem has been resolved since onset.  Associated symptoms include no fatigue, no appetite change and no headaches.  Past treatments include SSRIs - Selective serotonin reuptake inhibitors (bupropion stopped).  Compliance with treatment is good.  Previous treatment provided significant relief.   Lab Results  Component Value Date   CREATININE 0.80 08/17/2019   BUN 9 08/17/2019   NA 137 08/17/2019   K 4.8 08/17/2019   CL 97 08/17/2019   CO2 24  08/17/2019   Lab Results  Component Value Date   CHOL 155 08/17/2019   HDL 44 08/17/2019   LDLCALC 79 08/17/2019   TRIG 187 (H) 08/17/2019   CHOLHDL 3.5 08/17/2019   Lab Results  Component Value Date   TSH 2.380 08/17/2019   Lab Results  Component Value Date   HGBA1C 7.1 (A) 08/04/2020   Lab Results  Component Value Date   WBC 8.1 08/17/2019   HGB 14.5 08/17/2019   HCT 43.1 08/17/2019   MCV 81 08/17/2019   PLT 307 08/17/2019   Lab Results  Component Value Date   ALT 32 08/17/2019   AST 34 08/17/2019   ALKPHOS 129 (H) 08/17/2019   BILITOT 0.3 08/17/2019     Review of Systems  Constitutional: Negative for appetite change, fatigue, fever and unexpected weight change.  HENT: Negative for tinnitus and trouble swallowing.   Eyes: Negative for visual disturbance.  Respiratory: Negative for cough, chest tightness and shortness of breath.   Cardiovascular: Negative for chest pain, palpitations and leg swelling.  Gastrointestinal: Negative for abdominal pain.  Endocrine: Negative for polydipsia and polyuria.  Genitourinary: Negative for dysuria and hematuria.  Musculoskeletal: Positive for back pain. Negative for arthralgias.  Neurological: Negative for tremors, numbness and headaches.  Psychiatric/Behavioral: Positive for depression. Negative for dysphoric mood.    Patient Active Problem List   Diagnosis Date Noted  . Status post total hysterectomy and bilateral salpingo-oophorectomy 03/01/2020  . Gastroesophageal reflux disease without esophagitis 12/24/2017  . History of Clostridium difficile colitis 04/04/2017  . Hemorrhoids 01/31/2017  . Restless legs syndrome 12/31/2016  .  Essential hypertension 12/31/2016  . Lymphadenopathy, axillary 04/25/2015  . Type II diabetes mellitus with complication (HCC) 04/25/2015  . Insomnia 03/11/2015  . Chronic abdominal pain 11/17/2014  . C. difficile colitis 11/17/2014  . Anxiety, generalized 11/17/2014  . Depression, major,  recurrent, 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/2014  . Hot flash, menopausal 11/17/2014  . Migraine without aura and responsive to treatment 11/17/2014  . Asthma, moderate persistent 11/17/2014  . Abdominal pain 04/02/2012    Allergies  Allergen Reactions  . Nsaids Shortness Of Breath  . Azithromycin Rash  . Januvia [Sitagliptin] Nausea And Vomiting  . Sulfa Antibiotics Rash  . Aspirin   . Augmentin [Amoxicillin-Pot Clavulanate] Other (See Comments)    Insomnia, could not be still   . Ketorolac     Other reaction(s): Other (See Comments) Patient states Burning sensation inside Other reaction(s): Other (See Comments) Patient states Burning sensation inside  . Morphine Sulfate Nausea And Vomiting  . Nalbuphine     Other reaction(s): Other (See Comments) Burning on inside  . Prochlorperazine Edisylate Other (See Comments)    tachycardia  . Trazodone Cough  . Bupropion Rash  . Oxybutynin Rash    Past Surgical History:  Procedure Laterality Date  . ABDOMINAL HYSTERECTOMY  1999  . APPENDECTOMY    . BILATERAL SALPINGOOPHORECTOMY  2010  . CHOLECYSTECTOMY  2005  . COLONOSCOPY  2016    Social History   Tobacco Use  . Smoking status: Never Smoker  . Smokeless tobacco: Never Used  Substance Use Topics  . Alcohol use: No    Alcohol/week: 0.0 standard drinks  . Drug use: No     Medication list has been reviewed and updated.  Current Meds  Medication Sig  . albuterol (VENTOLIN HFA) 108 (90 Base) MCG/ACT inhaler INHALE 2 PUFFS BY MOUTH INTO THE LUNGS AS DIRECTED  . clonazePAM (KLONOPIN) 1 MG tablet 2 (two) times daily as needed.   Marland Kitchen escitalopram (LEXAPRO) 20 MG tablet Take 30 mg by mouth daily.   . fluticasone (FLONASE) 50 MCG/ACT nasal spray as needed.   . gabapentin (NEURONTIN) 600 MG tablet Take 600 mg by mouth at bedtime.  . hydrOXYzine (ATARAX/VISTARIL) 25 MG tablet Take 2 tablets by mouth every 8 (eight) hours as needed.   Marland Kitchen losartan (COZAAR) 50 MG tablet Take 1  tablet (50 mg total) by mouth daily.  . metFORMIN (GLUCOPHAGE) 500 MG tablet TAKE 3 TABLETS BY MOUTH DAILY WITH FOOD  . pantoprazole (PROTONIX) 40 MG tablet Take 1 tablet (40 mg total) by mouth 2 (two) times daily.  . promethazine (PHENERGAN) 25 MG tablet TAKE 1 TABLET BY MOUTH TWICE DAILY AS NEEDED  . rizatriptan (MAXALT) 10 MG tablet Take 1 tablet (10 mg total) by mouth as needed for migraine. May repeat in 2 hours if needed  . rOPINIRole (REQUIP) 1 MG tablet Take 3 tablets (3 mg total) by mouth at bedtime.  . traMADol (ULTRAM) 50 MG tablet Take 50 mg by mouth every 6 (six) hours as needed.   . TRULICITY 0.75 MG/0.5ML SOPN INJECT 0.75MG  SUBCUTANEOUSLY ONCE A WEEK  . [DISCONTINUED] cyclobenzaprine (FLEXERIL) 10 MG tablet TAKE 1 TABLET BY MOUTH 3 TIMES DAILY  . [DISCONTINUED] montelukast (SINGULAIR) 10 MG tablet TAKE 1 TABLET BY MOUTH EVERYDAY AT BEDTIME    PHQ 2/9 Scores 08/04/2020 03/01/2020 10/23/2019 08/17/2019  PHQ - 2 Score 2 4 4 6   PHQ- 9 Score 6 13 5 11     GAD 7 : Generalized Anxiety Score 08/04/2020 03/01/2020 10/23/2019 08/17/2019  Nervous, Anxious, on Edge 1 2 2 3   Control/stop worrying 1 0 2 1  Worry too much - different things 1 0 2 1  Trouble relaxing 0 2 1 3   Restless 0 2 1 2   Easily annoyed or irritable 0 0 2 2  Afraid - awful might happen 0 0 0 0  Total GAD 7 Score 3 6 10 12   Anxiety Difficulty - Somewhat difficult Somewhat difficult Not difficult at all    BP Readings from Last 3 Encounters:  08/04/20 122/82  03/01/20 114/68  10/23/19 116/78    Physical Exam Vitals and nursing note reviewed.  Constitutional:      General: She is not in acute distress.    Appearance: She is well-developed.  HENT:     Head: Normocephalic and atraumatic.  Cardiovascular:     Rate and Rhythm: Normal rate and regular rhythm.     Pulses: Normal pulses.  Pulmonary:     Effort: Pulmonary effort is normal. No respiratory distress.     Breath sounds: No wheezing or rhonchi.   Musculoskeletal:     Thoracic back: Spasms (along right side) present. No bony tenderness. Decreased range of motion. No scoliosis.     Lumbar back: No spasms, tenderness or bony tenderness.     Right lower leg: No edema.     Left lower leg: No edema.  Skin:    General: Skin is warm and dry.     Capillary Refill: Capillary refill takes less than 2 seconds.     Findings: No rash.  Neurological:     Mental Status: She is alert and oriented to person, place, and time.  Psychiatric:        Mood and Affect: Mood normal.        Behavior: Behavior normal.     Wt Readings from Last 3 Encounters:  08/04/20 160 lb (72.6 kg)  03/01/20 158 lb (71.7 kg)  10/23/19 138 lb (62.6 kg)    BP 122/82   Pulse 97   Temp 98.3 F (36.8 C) (Oral)   Ht 5\' 1"  (1.549 m)   Wt 160 lb (72.6 kg)   SpO2 99%   BMI 30.23 kg/m   Assessment and Plan: 1. Type II diabetes mellitus with complication (HCC) Clinically stable by exam and report without s/s of hypoglycemia. DM complicated by HTN. Tolerating medications well without side effects or other concerns. A1C POC = 7.1  2. Essential hypertension Clinically stable exam with well controlled BP. Tolerating medications without side effects at this time. Pt to continue current regimen and low sodium diet; benefits of regular exercise as able discussed.  3. Spasm of back muscles Use flexeril and heat Tramadol only for severe pain - cyclobenzaprine (FLEXERIL) 10 MG tablet; Take 1 tablet (10 mg total) by mouth 3 (three) times daily.  Dispense: 90 tablet; Refill: 0  4. Moderate persistent asthma without complication Doing well now off of Theophylline - montelukast (SINGULAIR) 10 MG tablet; TAKE 1 TABLET BY MOUTH EVERYDAY AT BEDTIME  Dispense: 90 tablet; Refill: 1  5. Depression, major, recurrent, in partial remission (HCC) Clinically stable on current regimen with good control of symptoms, No SI or HI. Will continue current therapy per  psychiatry    Partially dictated using Dragon software. Any errors are unintentional.  10/25/19, MD [facility]  08/04/2020

## 2020-08-25 ENCOUNTER — Other Ambulatory Visit: Payer: Self-pay | Admitting: Internal Medicine

## 2020-08-25 DIAGNOSIS — I10 Essential (primary) hypertension: Secondary | ICD-10-CM

## 2020-09-16 ENCOUNTER — Other Ambulatory Visit: Payer: Self-pay | Admitting: Internal Medicine

## 2020-09-16 DIAGNOSIS — E118 Type 2 diabetes mellitus with unspecified complications: Secondary | ICD-10-CM

## 2020-09-23 ENCOUNTER — Other Ambulatory Visit: Payer: Self-pay | Admitting: Internal Medicine

## 2020-09-23 DIAGNOSIS — M62830 Muscle spasm of back: Secondary | ICD-10-CM

## 2020-09-23 NOTE — Telephone Encounter (Signed)
Requested medication (s) are due for refill today: Yes  Requested medication (s) are on the active medication list: Yes  Last refill:  08/04/20  Future visit scheduled: Yes  Notes to clinic:  See request.    Requested Prescriptions  Pending Prescriptions Disp Refills   cyclobenzaprine (FLEXERIL) 10 MG tablet [Pharmacy Med Name: CYCLOBENZAPRINE HCL 10 MG TAB] 90 tablet 0    Sig: TAKE 1 TABLET BY MOUTH 3 TIMES DAILY -NEED APPT FOR FURTHER REFILLS-      Not Delegated - Analgesics:  Muscle Relaxants Failed - 09/23/2020 10:48 AM      Failed - This refill cannot be delegated      Passed - Valid encounter within last 6 months    Recent Outpatient Visits           1 month ago Type II diabetes mellitus with complication [facility])   [facility] Reubin Milan, MD   6 months ago Type II diabetes mellitus with complication [facility])   [facility] Reubin Milan, MD   11 months ago Type II diabetes mellitus with complication [facility])   [facility] Reubin Milan, MD   1 year ago Annual physical exam   [facility] Reubin Milan, MD   1 year ago Asthma exacerbation, mild   [facility] Reubin Milan, MD       Future Appointments             In 2 months [name] [name], MD [facility], [facility]

## 2020-10-17 ENCOUNTER — Other Ambulatory Visit: Payer: Self-pay | Admitting: Internal Medicine

## 2020-10-18 ENCOUNTER — Other Ambulatory Visit: Payer: Self-pay | Admitting: Internal Medicine

## 2020-10-18 DIAGNOSIS — J4540 Moderate persistent asthma, uncomplicated: Secondary | ICD-10-CM

## 2020-10-18 NOTE — Telephone Encounter (Signed)
dc'd 08/04/20 Completed course

## 2020-10-31 ENCOUNTER — Telehealth: Payer: Self-pay | Admitting: Internal Medicine

## 2020-10-31 DIAGNOSIS — J4540 Moderate persistent asthma, uncomplicated: Secondary | ICD-10-CM

## 2020-10-31 NOTE — Telephone Encounter (Signed)
  Notes to clinic:  requested medication has been d/c  Review for continued use    Requested Prescriptions  Pending Prescriptions Disp Refills   FLOVENT HFA 220 MCG/ACT inhaler [Pharmacy Med Name: FLOVENT HFA 220 MCG/ACT INH AERO GM] 12 g     Sig: INHALE 1 PUFF INTO THE LUNGS 2 TIMES DAILY      Pulmonology:  Corticosteroids Passed - 10/31/2020  9:09 AM      Passed - Valid encounter within last 12 months    Recent Outpatient Visits           2 months ago Type II diabetes mellitus with complication [facility])   [facility] Reubin Milan, MD   8 months ago Type II diabetes mellitus with complication [facility])   [facility] Reubin Milan, MD   1 year ago Type II diabetes mellitus with complication [facility])   [facility] Reubin Milan, MD   1 year ago Annual physical exam   [facility] Reubin Milan, MD   1 year ago Asthma exacerbation, mild   [facility] Reubin Milan, MD       Future Appointments             In 3 weeks [name] [name], MD [facility], [facility]

## 2020-11-01 ENCOUNTER — Other Ambulatory Visit: Payer: Self-pay

## 2020-11-01 DIAGNOSIS — J4540 Moderate persistent asthma, uncomplicated: Secondary | ICD-10-CM

## 2020-11-01 MED ORDER — FLUTICASONE PROPIONATE HFA 220 MCG/ACT IN AERO: 1.0000 | INHALATION_SPRAY | Freq: Two times a day (BID) | RESPIRATORY_TRACT | 1 refills | Status: DC

## 2020-11-01 NOTE — Telephone Encounter (Signed)
Patient called in to inform Dr [name] that she need this Rx filled because she still uses the inhaler.  Ph# [phone]

## 2020-11-01 NOTE — Telephone Encounter (Signed)
Refill sent in.  KP 

## 2020-11-10 ENCOUNTER — Other Ambulatory Visit: Payer: Self-pay | Admitting: Internal Medicine

## 2020-11-10 DIAGNOSIS — K219 Gastro-esophageal reflux disease without esophagitis: Secondary | ICD-10-CM

## 2020-11-15 ENCOUNTER — Other Ambulatory Visit: Payer: Self-pay | Admitting: Internal Medicine

## 2020-11-15 DIAGNOSIS — I10 Essential (primary) hypertension: Secondary | ICD-10-CM

## 2020-11-23 ENCOUNTER — Ambulatory Visit: Payer: BLUE CROSS/BLUE SHIELD | Admitting: Internal Medicine

## 2020-11-23 ENCOUNTER — Encounter: Payer: BLUE CROSS/BLUE SHIELD | Admitting: Internal Medicine

## 2020-12-09 ENCOUNTER — Other Ambulatory Visit: Payer: Self-pay | Admitting: Internal Medicine

## 2020-12-09 DIAGNOSIS — E118 Type 2 diabetes mellitus with unspecified complications: Secondary | ICD-10-CM

## 2021-01-03 ENCOUNTER — Encounter: Payer: BLUE CROSS/BLUE SHIELD | Admitting: Internal Medicine

## 2021-01-05 ENCOUNTER — Other Ambulatory Visit: Payer: Self-pay

## 2021-01-05 ENCOUNTER — Ambulatory Visit (INDEPENDENT_AMBULATORY_CARE_PROVIDER_SITE_OTHER): Payer: BLUE CROSS/BLUE SHIELD | Admitting: Internal Medicine

## 2021-01-05 ENCOUNTER — Encounter: Payer: Self-pay | Admitting: Internal Medicine

## 2021-01-05 VITALS — BP 112/70 | HR 99 | Temp 98.1°F | Ht 61.0 in | Wt 162.0 lb

## 2021-01-05 DIAGNOSIS — Z1231 Encounter for screening mammogram for malignant neoplasm of breast: Secondary | ICD-10-CM

## 2021-01-05 DIAGNOSIS — E118 Type 2 diabetes mellitus with unspecified complications: Secondary | ICD-10-CM | POA: Diagnosis not present

## 2021-01-05 DIAGNOSIS — Z0000 Encounter for general adult medical examination without abnormal findings: Secondary | ICD-10-CM | POA: Diagnosis not present

## 2021-01-05 DIAGNOSIS — M25561 Pain in right knee: Secondary | ICD-10-CM

## 2021-01-05 DIAGNOSIS — M62830 Muscle spasm of back: Secondary | ICD-10-CM | POA: Diagnosis not present

## 2021-01-05 DIAGNOSIS — I10 Essential (primary) hypertension: Secondary | ICD-10-CM | POA: Diagnosis not present

## 2021-01-05 LAB — POCT URINALYSIS DIPSTICK
Bilirubin, UA: NEGATIVE
Blood, UA: NEGATIVE
Glucose, UA: NEGATIVE
Ketones, UA: NEGATIVE
Leukocytes, UA: NEGATIVE
Nitrite, UA: NEGATIVE
Protein, UA: POSITIVE — AB
Spec Grav, UA: <=1.005 — AB (ref 1.010–1.025)
Urobilinogen, UA: 0.2 E.U./dL
pH, UA: 6 (ref 5.0–8.0)

## 2021-01-05 MED ORDER — CYCLOBENZAPRINE HCL 10 MG PO TABS: ORAL_TABLET | ORAL | 0 refills | Status: DC

## 2021-01-05 MED ORDER — TRULICITY 1.5 MG/0.5ML ~~LOC~~ SOAJ: 1.5000 mg | SUBCUTANEOUS | 3 refills | Status: DC

## 2021-01-05 NOTE — Patient Instructions (Addendum)
Tylenol 1000 mg three times a day  Ice knee 20 min three times a day  Use topical rub four times a day  Consider seeing Dr Ashley Royalty here or go to [facility] in [facility]  Schedule a mammogram and a diabetic Eye exam

## 2021-01-05 NOTE — Telephone Encounter (Signed)
complete

## 2021-01-05 NOTE — Progress Notes (Signed)
Date:  01/05/2021   Name:  Crystal Rich   DOB:  03-11-1966   MRN:  [id number]   Chief Complaint: Annual Exam (Breast exam no pap) and Leg Pain (Pt is aware of additional charge /) Crystal Rich is a 55 y.o. female who presents today for her Complete Annual Exam. She feels fairly well. She reports exercising walking dog daily. She reports she is sleeping poorly. Breast complaints none.  Mammogram: 2013 DEXA: none Pap smear: discontinued Colonoscopy: 2016   Immunization History  Administered Date(s) Administered   Influenza-Unspecified 04/15/2018   Pneumococcal Polysaccharide-23 08/17/2019   Tdap 03/15/2008, 09/19/2012    Hypertension This is a chronic problem. The problem is controlled. Pertinent negatives include no chest pain, headaches, palpitations or shortness of breath. Past treatments include angiotensin blockers. The current treatment provides significant improvement.  Diabetes She presents for her follow-up diabetic visit. She has type 2 diabetes mellitus. Her disease course has been fluctuating. Pertinent negatives for hypoglycemia include no dizziness, headaches, nervousness/anxiousness or tremors. Pertinent negatives for diabetes include no chest pain, no fatigue, no foot paresthesias, no polydipsia and no polyuria. There are no hypoglycemic complications. Pertinent negatives for diabetic complications include no heart disease or nephropathy. Current diabetic treatment includes oral agent (monotherapy) (and Trulicity). She is compliant with treatment all of the time. Her home blood glucose trend is increasing rapidly. (Some readings over 300; none below 100)  Hyperlipidemia This is a chronic problem. Pertinent negatives include no chest pain or shortness of breath.  Knee Pain  There was no injury mechanism. The pain is present in the right knee. The quality of the pain is described as aching and shooting. The pain is moderate. The pain has been Fluctuating since  onset. Associated symptoms include an inability to bear weight. The symptoms are aggravated by movement and palpation. She has tried nothing for the symptoms.   Lab Results  Component Value Date   CREATININE 0.80 08/17/2019   BUN 9 08/17/2019   NA 137 08/17/2019   K 4.8 08/17/2019   CL 97 08/17/2019   CO2 24 08/17/2019   Lab Results  Component Value Date   CHOL 155 08/17/2019   HDL 44 08/17/2019   LDLCALC 79 08/17/2019   TRIG 187 (H) 08/17/2019   CHOLHDL 3.5 08/17/2019   Lab Results  Component Value Date   TSH 2.380 08/17/2019   Lab Results  Component Value Date   HGBA1C 7.1 (A) 08/04/2020   Lab Results  Component Value Date   WBC 8.1 08/17/2019   HGB 14.5 08/17/2019   HCT 43.1 08/17/2019   MCV 81 08/17/2019   PLT 307 08/17/2019   Lab Results  Component Value Date   ALT 32 08/17/2019   AST 34 08/17/2019   ALKPHOS 129 (H) 08/17/2019   BILITOT 0.3 08/17/2019     Review of Systems  Constitutional:  Negative for chills, fatigue and fever.  HENT:  Positive for voice change. Negative for congestion, hearing loss, tinnitus and trouble swallowing.   Eyes:  Negative for visual disturbance.  Respiratory:  Negative for cough, chest tightness, shortness of breath and wheezing.   Cardiovascular:  Negative for chest pain, palpitations and leg swelling.  Gastrointestinal:  Positive for abdominal distention. Negative for abdominal pain, constipation, diarrhea and vomiting.  Endocrine: Negative for polydipsia and polyuria.  Genitourinary:  Negative for dysuria, frequency, genital sores, vaginal bleeding and vaginal discharge.  Musculoskeletal:  Positive for arthralgias. Negative for gait problem and joint swelling.  Skin:  Negative for color change and rash.  Neurological:  Negative for dizziness, tremors, light-headedness and headaches.  Hematological:  Negative for adenopathy. Does not bruise/bleed easily.  Psychiatric/Behavioral:  Negative for dysphoric mood and sleep  disturbance. The patient is not nervous/anxious.    Patient Active Problem List   Diagnosis Date Noted   Status post total hysterectomy and bilateral salpingo-oophorectomy 03/01/2020   Gastroesophageal reflux disease without esophagitis 12/24/2017   History of Clostridium difficile colitis 04/04/2017   Hemorrhoids 01/31/2017   Restless legs syndrome 12/31/2016   Essential hypertension 12/31/2016   Lymphadenopathy, axillary 04/25/2015   Type II diabetes mellitus with complication (HCC) 04/25/2015   Insomnia 03/11/2015   Chronic abdominal pain 11/17/2014   C. 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   Hot flash, menopausal 11/17/2014   Migraine without aura and responsive to treatment 11/17/2014   Asthma, moderate persistent 11/17/2014   Abdominal pain 04/02/2012    Allergies  Allergen Reactions   Nsaids Shortness Of Breath   Azithromycin Rash   Doxycycline Itching   Januvia [Sitagliptin] Nausea And Vomiting   Sulfa Antibiotics Rash   Aspirin    Augmentin [Amoxicillin-Pot Clavulanate] Other (See Comments)    Insomnia, could not be still    Ketorolac     Other reaction(s): Other (See Comments) Patient states Burning sensation inside Other reaction(s): Other (See Comments) Patient states Burning sensation inside   Morphine Sulfate Nausea And Vomiting   Nalbuphine     Other reaction(s): Other (See Comments) Burning on inside   Prochlorperazine Edisylate Other (See Comments)    tachycardia   Trazodone Cough   Bupropion Rash   Oxybutynin Rash    Past Surgical History:  Procedure Laterality Date   ABDOMINAL HYSTERECTOMY  1999   APPENDECTOMY     BILATERAL SALPINGOOPHORECTOMY  2010   CHOLECYSTECTOMY  2005   COLONOSCOPY  2016    Social History   Tobacco Use   Smoking status: Never   Smokeless tobacco: Never  Substance Use Topics   Alcohol use: No    Alcohol/week: 0.0  standard drinks   Drug use: No     Medication list has been reviewed and updated.  Current Meds  Medication Sig   albuterol (VENTOLIN HFA) 108 (90 Base) MCG/ACT inhaler INHALE 2 PUFFS BY MOUTH INTO THE LUNGS AS DIRECTED   clonazePAM (KLONOPIN) 1 MG tablet 2 (two) times daily as needed.    cyclobenzaprine (FLEXERIL) 10 MG tablet TAKE 1 TABLET BY MOUTH 3 TIMES DAILY -NEED APPT FOR FURTHER REFILLS-   escitalopram (LEXAPRO) 20 MG tablet Take 40 mg by mouth daily.   fluticasone (FLONASE) 50 MCG/ACT nasal spray as needed.    fluticasone (FLOVENT HFA) 220 MCG/ACT inhaler Inhale 1 puff into the lungs 2 (two) times daily.   gabapentin (NEURONTIN) 600 MG tablet Take 600 mg by mouth at bedtime.   hydrOXYzine (ATARAX/VISTARIL) 25 MG tablet Take 2 tablets by mouth every 8 (eight) hours as needed.    losartan (COZAAR) 50 MG tablet TAKE 1 TABLET BY MOUTH EVERY DAY   metFORMIN (GLUCOPHAGE) 500 MG tablet TAKE 3 TABLETS BY MOUTH DAILY WITH FOOD   montelukast (SINGULAIR) 10 MG tablet TAKE 1 TABLET BY MOUTH EVERYDAY AT BEDTIME   pantoprazole (PROTONIX) 40 MG tablet TAKE 1 TABLET BY MOUTH TWICE DAILY   promethazine (PHENERGAN) 25 MG tablet TAKE 1 TABLET BY MOUTH TWICE DAILY AS NEEDED   rizatriptan (MAXALT) 10 MG  tablet Take 1 tablet (10 mg total) by mouth as needed for migraine. May repeat in 2 hours if needed   rOPINIRole (REQUIP) 1 MG tablet Take 3 tablets (3 mg total) by mouth at bedtime.   sucralfate (CARAFATE) 1 g tablet Take 1 g by mouth 4 (four) times daily.   traMADol (ULTRAM) 50 MG tablet Take 50 mg by mouth every 6 (six) hours as needed.    TRULICITY 0.75 MG/0.5ML SOPN INJECT 0.75MG  SUBCUTANEOUSLY ONCE A WEEK    PHQ 2/9 Scores 08/04/2020 03/01/2020 10/23/2019 08/17/2019  PHQ - 2 Score 2 4 4 6   PHQ- 9 Score 6 13 5 11     GAD 7 : Generalized Anxiety Score 08/04/2020 03/01/2020 10/23/2019 08/17/2019  Nervous, Anxious, on Edge 1 2 2 3   Control/stop worrying 1 0 2 1  Worry too much - different things 1  0 2 1  Trouble relaxing 0 2 1 3   Restless 0 2 1 2   Easily annoyed or irritable 0 0 2 2  Afraid - awful might happen 0 0 0 0  Total GAD 7 Score 3 6 10 12   Anxiety Difficulty - Somewhat difficult Somewhat difficult Not difficult at all    BP Readings from Last 3 Encounters:  01/05/21 112/70  08/04/20 122/82  03/01/20 114/68    Physical Exam Vitals and nursing note reviewed.  Constitutional:      General: She is not in acute distress.    Appearance: She is well-developed.  HENT:     Head: Normocephalic and atraumatic.     Right Ear: Tympanic membrane and ear canal normal.     Left Ear: Tympanic membrane and ear canal normal.     Nose:     Right Sinus: No maxillary sinus tenderness.     Left Sinus: No maxillary sinus tenderness.  Eyes:     General: No scleral icterus.       Right eye: No discharge.        Left eye: No discharge.     Conjunctiva/sclera: Conjunctivae normal.  Neck:     Thyroid: No thyromegaly.     Vascular: No carotid bruit.  Cardiovascular:     Rate and Rhythm: Normal rate and regular rhythm.     Pulses: Normal pulses.     Heart sounds: Normal heart sounds.  Pulmonary:     Effort: Pulmonary effort is normal. No respiratory distress.     Breath sounds: No wheezing.  Chest:  Breasts:    Right: No mass, nipple discharge, skin change or tenderness.     Left: No mass, nipple discharge, skin change or tenderness.  Abdominal:     General: Bowel sounds are normal.     Palpations: Abdomen is soft.     Tenderness: There is no abdominal tenderness.  Musculoskeletal:     Cervical back: Normal range of motion. No erythema.     Right lower leg: No edema.     Left lower leg: No edema.  Lymphadenopathy:     Cervical: No cervical adenopathy.  Skin:    General: Skin is warm and dry.     Capillary Refill: Capillary refill takes less than 2 seconds.     Findings: No rash.  Neurological:     General: No focal deficit present.     Mental Status: She is alert and  oriented to person, place, and time.     Cranial Nerves: No cranial nerve deficit.     Sensory: No sensory deficit.  Deep Tendon Reflexes: Reflexes are normal and symmetric.  Psychiatric:        Attention and Perception: Attention normal.        Mood and Affect: Mood normal.    Wt Readings from Last 3 Encounters:  01/05/21 162 lb (73.5 kg)  08/04/20 160 lb (72.6 kg)  03/01/20 158 lb (71.7 kg)    BP 112/70   Pulse 99   Temp 98.1 F (36.7 C) (Oral)   Ht 5\' 1"  (1.549 m)   Wt 162 lb (73.5 kg)   SpO2 98%   BMI 30.61 kg/m   Assessment and Plan: 1. Annual physical exam Exam is normal except for weight. Encourage regular exercise and appropriate dietary changes.  2. Encounter for screening mammogram for breast cancer Schedule at [facility] - MM 3D SCREEN BREAST BILATERAL  3. Essential hypertension Clinically stable exam with well controlled BP. Tolerating medications without side effects at this time. Pt to continue current regimen and low sodium diet; benefits of regular exercise as able discussed. - CBC with Differential/Platelet - TSH - POCT urinalysis dipstick  4. Type II diabetes mellitus with complication (HCC) BS have been elevated recently for unclear reasons. Will increase dose of Trulicity; continue metformin Pt reminded to schedule DM eye exam - Comprehensive metabolic panel - Hemoglobin A1c - Lipid panel - Dulaglutide (TRULICITY) 1.5 MG/0.5ML SOPN; Inject 1.5 mg into the skin once a week.  Dispense: 2 mL; Refill: 3  5. Spasm of back muscles Chronic recurrent - cyclobenzaprine (FLEXERIL) 10 MG tablet; TAKE 1 TABLET BY MOUTH 3 TIMES DAILY -  Dispense: 90 tablet; Refill: 0  6. Acute pain of right knee Suspect tendonitis Will treat with regular doses of tylenol, ice and topical rubs If no improvement, refer to SM or Ortho   Partially dictated using [facility]. Any errors are unintentional.  Animal nutritionist, MD [facility]  01/05/2021

## 2021-01-06 ENCOUNTER — Encounter: Payer: Self-pay | Admitting: Internal Medicine

## 2021-01-06 LAB — CBC WITH DIFFERENTIAL/PLATELET
Basophils Absolute: 0.1 10*3/uL (ref 0.0–0.2)
Basos: 1 %
EOS (ABSOLUTE): 0.3 10*3/uL (ref 0.0–0.4)
Eos: 4 %
Hematocrit: 37.2 % (ref 34.0–46.6)
Hemoglobin: 12.1 g/dL (ref 11.1–15.9)
Immature Grans (Abs): 0 10*3/uL (ref 0.0–0.1)
Immature Granulocytes: 0 %
Lymphocytes Absolute: 1.7 10*3/uL (ref 0.7–3.1)
Lymphs: 25 %
MCH: 26.9 pg (ref 26.6–33.0)
MCHC: 32.5 g/dL (ref 31.5–35.7)
MCV: 83 fL (ref 79–97)
Monocytes Absolute: 0.4 10*3/uL (ref 0.1–0.9)
Monocytes: 5 %
Neutrophils Absolute: 4.4 10*3/uL (ref 1.4–7.0)
Neutrophils: 65 %
Platelets: 255 10*3/uL (ref 150–450)
RBC: 4.49 x10E6/uL (ref 3.77–5.28)
RDW: 12.4 % (ref 11.7–15.4)
WBC: 6.9 10*3/uL (ref 3.4–10.8)

## 2021-01-06 LAB — TSH: TSH: 4.69 u[IU]/mL — ABNORMAL HIGH (ref 0.450–4.500)

## 2021-01-06 LAB — COMPREHENSIVE METABOLIC PANEL
ALT: 62 IU/L — ABNORMAL HIGH (ref 0–32)
AST: 89 IU/L — ABNORMAL HIGH (ref 0–40)
Albumin/Globulin Ratio: 1.9 (ref 1.2–2.2)
Albumin: 4.4 g/dL (ref 3.8–4.9)
Alkaline Phosphatase: 148 IU/L — ABNORMAL HIGH (ref 44–121)
BUN/Creatinine Ratio: 20 (ref 9–23)
BUN: 17 mg/dL (ref 6–24)
Bilirubin Total: 0.4 mg/dL (ref 0.0–1.2)
CO2: 23 mmol/L (ref 20–29)
Calcium: 9.8 mg/dL (ref 8.7–10.2)
Chloride: 93 mmol/L — ABNORMAL LOW (ref 96–106)
Creatinine, Ser: 0.86 mg/dL (ref 0.57–1.00)
Globulin, Total: 2.3 g/dL (ref 1.5–4.5)
Glucose: 238 mg/dL — ABNORMAL HIGH (ref 65–99)
Potassium: 4.1 mmol/L (ref 3.5–5.2)
Sodium: 135 mmol/L (ref 134–144)
Total Protein: 6.7 g/dL (ref 6.0–8.5)
eGFR: 81 mL/min/{1.73_m2} (ref >59)

## 2021-01-06 LAB — LIPID PANEL
Chol/HDL Ratio: 3.6 ratio (ref 0.0–4.4)
Cholesterol, Total: 138 mg/dL (ref 100–199)
HDL: 38 mg/dL — ABNORMAL LOW (ref >39)
LDL Chol Calc (NIH): 61 mg/dL (ref 0–99)
Triglycerides: 244 mg/dL — ABNORMAL HIGH (ref 0–149)
VLDL Cholesterol Cal: 39 mg/dL (ref 5–40)

## 2021-01-06 LAB — HEMOGLOBIN A1C
Est. average glucose Bld gHb Est-mCnc: 292 mg/dL
Hgb A1c MFr Bld: 11.8 % — ABNORMAL HIGH (ref 4.8–5.6)

## 2021-01-30 ENCOUNTER — Other Ambulatory Visit: Payer: Self-pay | Admitting: Internal Medicine

## 2021-01-30 DIAGNOSIS — J4540 Moderate persistent asthma, uncomplicated: Secondary | ICD-10-CM

## 2021-01-31 ENCOUNTER — Other Ambulatory Visit: Payer: Self-pay | Admitting: Internal Medicine

## 2021-02-06 ENCOUNTER — Other Ambulatory Visit: Payer: Self-pay | Admitting: Internal Medicine

## 2021-02-06 DIAGNOSIS — I10 Essential (primary) hypertension: Secondary | ICD-10-CM

## 2021-03-20 ENCOUNTER — Other Ambulatory Visit: Payer: Self-pay | Admitting: Internal Medicine

## 2021-03-20 DIAGNOSIS — M62830 Muscle spasm of back: Secondary | ICD-10-CM

## 2021-03-20 NOTE — Telephone Encounter (Signed)
Requested medication (s) are due for refill today -yes  Requested medication (s) are on the active medication list -yes  Future visit scheduled -yes  Last refill: 01/05/21 #90  Notes to clinic: Request RF: non delegated Rx  Requested Prescriptions  Pending Prescriptions Disp Refills   cyclobenzaprine (FLEXERIL) 10 MG tablet [Pharmacy Med Name: CYCLOBENZAPRINE HCL 10 MG TAB] 90 tablet 0    Sig: TAKE 1 TABLET BY MOUTH 3 TIMES DAILY     Not Delegated - Analgesics:  Muscle Relaxants Failed - 03/20/2021  9:25 AM      Failed - This refill cannot be delegated      Passed - Valid encounter within last 6 months    Recent Outpatient Visits           2 months ago Annual physical exam   [facility] Reubin Milan, MD   7 months ago Type II diabetes mellitus with complication [facility])   [facility] Reubin Milan, MD   1 year ago Type II diabetes mellitus with complication [facility])   [facility] Reubin Milan, MD   1 year ago Type II diabetes mellitus with complication [facility])   [facility] Reubin Milan, MD   1 year ago Annual physical exam   [facility] Reubin Milan, MD       Future Appointments             In 1 month [name] [name], MD [facility]   In 9 months [name], [name], MD [facility], [facility]               Requested Prescriptions  Pending Prescriptions Disp Refills   cyclobenzaprine (FLEXERIL) 10 MG tablet [Pharmacy Med Name: CYCLOBENZAPRINE HCL 10 MG TAB] 90 tablet 0    Sig: TAKE 1 TABLET BY MOUTH 3 TIMES DAILY     Not Delegated - Analgesics:  Muscle Relaxants Failed - 03/20/2021  9:25 AM      Failed - This refill cannot be delegated      Passed - Valid encounter within last 6 months    Recent Outpatient Visits           2 months ago Annual physical exam   [facility] Reubin Milan, MD   7 months ago Type II diabetes mellitus with complication [facility])    [facility] Reubin Milan, MD   1 year ago Type II diabetes mellitus with complication [facility])   [facility] Reubin Milan, MD   1 year ago Type II diabetes mellitus with complication [facility])   [facility] Reubin Milan, MD   1 year ago Annual physical exam   [facility] Reubin Milan, MD       Future Appointments             In 1 month [name] [name], MD [facility], [facility]   In 9 months [name], [name], MD [facility], [facility]

## 2021-04-19 ENCOUNTER — Other Ambulatory Visit: Payer: Self-pay | Admitting: Internal Medicine

## 2021-04-19 DIAGNOSIS — E118 Type 2 diabetes mellitus with unspecified complications: Secondary | ICD-10-CM

## 2021-04-19 DIAGNOSIS — G2581 Restless legs syndrome: Secondary | ICD-10-CM

## 2021-04-19 NOTE — Telephone Encounter (Signed)
Requested medication (s) are due for refill today - yes  Requested medication (s) are on the active medication list -yes  Future visit scheduled -yes  Last refill: 03/01/20 #90 5RF  Notes to clinic: Request RF: expired Rx  Requested Prescriptions  Pending Prescriptions Disp Refills   rOPINIRole (REQUIP) 1 MG tablet [Pharmacy Med Name: ROPINIROLE HCL 1 MG TAB] 90 tablet 5    Sig: TAKE 3 TABLETS BY MOUTH AT BEDTIME     Neurology:  Parkinsonian Agents Passed - 04/19/2021 10:41 AM      Passed - Last BP in normal range    BP Readings from Last 1 Encounters:  01/05/21 112/70          Passed - Valid encounter within last 12 months    Recent Outpatient Visits           3 months ago Annual physical exam   [facility] Reubin Milan, MD   8 months ago Type II diabetes mellitus with complication [facility])   [facility] Reubin Milan, MD   1 year ago Type II diabetes mellitus with complication [facility])   [facility] Reubin Milan, MD   1 year ago Type II diabetes mellitus with complication [facility])   [facility] Reubin Milan, MD   1 year ago Annual physical exam   [facility] Re[facility] Milan, MD       Future Appointments             In 3 weeks [name] [name], MD [facility], [facility]   In 8 months [name], [name], MD [facility], [facility]               Requested Prescriptions  Pending Prescriptions Disp Refills   rOPINIRole (REQUIP) 1 MG tablet [Pharmacy Med Name: ROPINIROLE HCL 1 MG TAB] 90 tablet 5    Sig: TAKE 3 TABLETS BY MOUTH AT BEDTIME     Neurology:  Parkinsonian Agents Passed - 04/19/2021 10:41 AM      Passed - Last BP in normal range    BP Readings from Last 1 Encounters:  01/05/21 112/70          Passed - Valid encounter within last 12 months    Recent Outpatient Visits           3 months ago Annual physical exam   [facility] Reubin Milan, MD   8 months ago  Type II diabetes mellitus with complication [facility])   [facility] Reubin Milan, MD   1 year ago Type II diabetes mellitus with complication [facility])   [facility] Reubin Milan, MD   1 year ago Type II diabetes mellitus with complication [facility])   [facility] Reubin Milan, MD   1 year ago Annual physical exam   [facility] Reubin Milan, MD       Future Appointments             In 3 weeks [name] [name], MD [facility], [facility]   In 8 months [name], [name], MD [facility], [facility]

## 2021-04-20 NOTE — Telephone Encounter (Signed)
Requested Prescriptions  Pending Prescriptions Disp Refills   TRULICITY 1.5 MG/0.5ML SOPN [Pharmacy Med Name: TRULICITY 1.5 MG/0.5ML SUBQ SOLN ML] 2 mL 3    Sig: INJECT 1.5mg  INTO THE SKIN ONCE A WEEK     Endocrinology:  Diabetes - GLP-1 Receptor Agonists Failed - 04/19/2021  4:02 PM      Failed - HBA1C is between 0 and 7.9 and within 180 days    Hgb A1c MFr Bld  Date Value Ref Range Status  01/05/2021 11.8 (H) 4.8 - 5.6 % Final    Comment:             Prediabetes: 5.7 - 6.4          Diabetes: >6.4          Glycemic control for adults with diabetes: <7.0          Passed - Valid encounter within last 6 months    Recent Outpatient Visits          3 months ago Annual physical exam   [facility] Reubin [facility], MD   8 months ago Type II diabetes mellitus with complication [facility])   [facility] Reubin Milan, MD   1 year ago Type II diabetes mellitus with complication [facility])   [facility] Reubin Milan, MD   1 year ago Type II diabetes mellitus with complication [facility])   [facility] Reubin Milan, MD   1 year ago Annual physical exam   [facility] Reubin Milan, MD      Future Appointments            In 2 weeks [name] [name], MD [facility], [facility]   In 8 months [name], [name], MD [facility], [facility]

## 2021-04-25 ENCOUNTER — Other Ambulatory Visit: Payer: Self-pay | Admitting: Internal Medicine

## 2021-04-25 DIAGNOSIS — I10 Essential (primary) hypertension: Secondary | ICD-10-CM

## 2021-04-25 NOTE — Telephone Encounter (Signed)
Requested Prescriptions  Pending Prescriptions Disp Refills   losartan (COZAAR) 50 MG tablet [Pharmacy Med Name: LOSARTAN POTASSIUM 50 MG TAB] 90 tablet 0    Sig: TAKE 1 TABLET BY MOUTH EVERY DAY     Cardiovascular:  Angiotensin Receptor Blockers Passed - 04/25/2021  9:31 AM      Passed - Cr in normal range and within 180 days    Creatinine, Ser  Date Value Ref Range Status  01/05/2021 0.86 0.57 - 1.00 mg/dL Final         Passed - K in normal range and within 180 days    Potassium  Date Value Ref Range Status  01/05/2021 4.1 3.5 - 5.2 mmol/L Final         Passed - Patient is not pregnant      Passed - Last BP in normal range    BP Readings from Last 1 Encounters:  01/05/21 112/70         Passed - Valid encounter within last 6 months    Recent Outpatient Visits          3 months ago Annual physical exam   [facility] Re[facility], MD   8 months ago Type II diabetes mellitus with complication [facility])   [facility] Reubin Milan, MD   1 year ago Type II diabetes mellitus with complication [facility])   [facility] Reubin Milan, MD   1 year ago Type II diabetes mellitus with complication [facility])   [facility] Reubin Milan, MD   1 year ago Annual physical exam   [facility] Reubin Milan, MD      Future Appointments            In 2 weeks [name] [name], MD [facility], [facility]   In 8 months [name], [name], MD [facility], [facility]

## 2021-05-05 ENCOUNTER — Ambulatory Visit: Payer: BLUE CROSS/BLUE SHIELD | Admitting: Internal Medicine

## 2021-05-05 ENCOUNTER — Encounter: Payer: Self-pay | Admitting: Internal Medicine

## 2021-05-05 ENCOUNTER — Ambulatory Visit: Payer: Self-pay

## 2021-05-05 ENCOUNTER — Other Ambulatory Visit: Payer: Self-pay

## 2021-05-05 VITALS — BP 124/80 | HR 79 | Temp 99.4°F | Ht 61.0 in | Wt 169.2 lb

## 2021-05-05 DIAGNOSIS — J029 Acute pharyngitis, unspecified: Secondary | ICD-10-CM

## 2021-05-05 DIAGNOSIS — J0100 Acute maxillary sinusitis, unspecified: Secondary | ICD-10-CM

## 2021-05-05 LAB — POCT RAPID STREP A (OFFICE): Rapid Strep A Screen: NEGATIVE

## 2021-05-05 MED ORDER — PREDNISONE 10 MG PO TABS: 10.0000 mg | ORAL_TABLET | ORAL | 0 refills | Status: AC

## 2021-05-05 MED ORDER — FLUCONAZOLE 100 MG PO TABS: 100.0000 mg | ORAL_TABLET | Freq: Every day | ORAL | 0 refills | Status: AC

## 2021-05-05 MED ORDER — PROMETHAZINE-DM 6.25-15 MG/5ML PO SYRP: 5.0000 mL | ORAL_SOLUTION | Freq: Four times a day (QID) | ORAL | 0 refills | Status: DC | PRN

## 2021-05-05 MED ORDER — CEFUROXIME AXETIL 500 MG PO TABS: 500.0000 mg | ORAL_TABLET | Freq: Two times a day (BID) | ORAL | 0 refills | Status: AC

## 2021-05-05 NOTE — Telephone Encounter (Signed)
°  Chief Complaint: Productive cough Symptoms: Cough, right ear pain, sore throat, COVID negative Frequency: Started 3 days ago. Pertinent Negatives: Patient denies fever. Disposition: [] ED /[] Urgent Care (no appt availability in office) / [x] Appointment(In office/virtual)/ []  [facility]/ [] Home Care/ [] Refused Recommended Disposition /[] [place] [facility]/ []  Follow-up with PCP Additional Notes: Pt. Has appointment at 3:40.Asking to be worked in sooner if possible. Please advise pt.    Reason for Disposition  [1] Continuous (nonstop) coughing interferes with work or school AND [2] no improvement using cough treatment per Care Advice  Answer Assessment - Initial Assessment Questions 1. ONSET: "When did the cough begin?"      3 days 2. SEVERITY: "How bad is the cough today?"      Severe 3. SPUTUM: "Describe the color of your sputum" (none, dry cough; clear, white, yellow, green)     Thick, yellow 4. HEMOPTYSIS: "Are you coughing up any blood?" If so ask: "How much?" (flecks, streaks, tablespoons, etc.)     No 5. DIFFICULTY BREATHING: "Are you having difficulty breathing?" If Yes, ask: "How bad is it?" (e.g., mild, moderate, severe)    - MILD: No SOB at rest, mild SOB with walking, speaks normally in sentences, can lie down, no retractions, pulse < 100.    - MODERATE: SOB at rest, SOB with minimal exertion and prefers to sit, cannot lie down flat, speaks in phrases, mild retractions, audible wheezing, pulse 100-120.    - SEVERE: Very SOB at rest, speaks in single words, struggling to breathe, sitting hunched forward, retractions, pulse > 120      No 6. FEVER: "Do you have a fever?" If Yes, ask: "What is your temperature, how was it measured, and when did it start?"     No 7. CARDIAC HISTORY: "Do you have any history of heart disease?" (e.g., heart attack, congestive heart failure)      No 8. LUNG HISTORY: "Do you have any history of lung disease?"  (e.g., pulmonary  embolus, asthma, emphysema)     Asthma 9. PE RISK FACTORS: "Do you have a history of blood clots?" (or: recent major surgery, recent prolonged travel, bedridden)     No 10. OTHER SYMPTOMS: "Do you have any other symptoms?" (e.g., runny nose, wheezing, chest pain)       Sore throat, right ear pain 11. PREGNANCY: "Is there any chance you are pregnant?" "When was your last menstrual period?"       No 12. TRAVEL: "Have you traveled out of the country in the last month?" (e.g., travel history, exposures)       No  Protocols used: Cough - Acute Productive-A-AH

## 2021-05-05 NOTE — Progress Notes (Signed)
Date:  05/05/2021   Name:  Crystal Rich   DOB:  Dec 17, 1965   MRN:  [id number]   Chief Complaint: Cough  Cough This is a new problem. The current episode started in the past 7 days. The problem occurs hourly. The cough is Productive of sputum. Associated symptoms include ear pain, headaches and a sore throat. Pertinent negatives include no chest pain, chills, fever or shortness of breath.  Sinus Problem This is a new problem. The current episode started in the past 7 days. The problem has been gradually worsening since onset. There has been no fever. Associated symptoms include congestion, coughing, ear pain, headaches, sinus pressure and a sore throat. Pertinent negatives include no chills, hoarse voice or shortness of breath.   Lab Results  Component Value Date   NA 135 01/05/2021   K 4.1 01/05/2021   CO2 23 01/05/2021   GLUCOSE 238 (H) 01/05/2021   BUN 17 01/05/2021   CREATININE 0.86 01/05/2021   CALCIUM 9.8 01/05/2021   EGFR 81 01/05/2021   GFRNONAA 85 08/17/2019   Lab Results  Component Value Date   CHOL 138 01/05/2021   HDL 38 (L) 01/05/2021   LDLCALC 61 01/05/2021   TRIG 244 (H) 01/05/2021   CHOLHDL 3.6 01/05/2021   Lab Results  Component Value Date   TSH 4.690 (H) 01/05/2021   Lab Results  Component Value Date   HGBA1C 11.8 (H) 01/05/2021   Lab Results  Component Value Date   WBC 6.9 01/05/2021   HGB 12.1 01/05/2021   HCT 37.2 01/05/2021   MCV 83 01/05/2021   PLT 255 01/05/2021   Lab Results  Component Value Date   ALT 62 (H) 01/05/2021   AST 89 (H) 01/05/2021   ALKPHOS 148 (H) 01/05/2021   BILITOT 0.4 01/05/2021   No results found for: 25OHVITD2, 25OHVITD3, VD25OH   Review of Systems  Constitutional:  Positive for fatigue. Negative for chills and fever.  HENT:  Positive for congestion, ear pain, sinus pressure and sore throat. Negative for hoarse voice.   Respiratory:  Positive for cough. Negative for chest tightness and shortness of  breath.   Cardiovascular:  Negative for chest pain and palpitations.  Gastrointestinal:  Negative for diarrhea and vomiting.  Neurological:  Positive for headaches.  Psychiatric/Behavioral:  Positive for sleep disturbance. Negative for dysphoric mood. The patient is not nervous/anxious.    Patient Active Problem List   Diagnosis Date Noted   Status post total hysterectomy and bilateral salpingo-oophorectomy 03/01/2020   Gastroesophageal reflux disease without esophagitis 12/24/2017   History of Clostridium difficile colitis 04/04/2017   Hemorrhoids 01/31/2017   Restless legs syndrome 12/31/2016   Essential hypertension 12/31/2016   Lymphadenopathy, axillary 04/25/2015   Type II diabetes mellitus with complication ([place]) 88/50/2774   Insomnia 03/11/2015   Chronic abdominal pain 11/17/2014   C. difficile colitis 11/17/2014   Anxiety, generalized 11/17/2014   Depression, major, recurrent, in partial remission ([place]) 11/17/2014   Hot flash, menopausal 11/17/2014   Migraine without aura and responsive to treatment 11/17/2014   Asthma, moderate persistent 11/17/2014   Abdominal pain 04/02/2012    Allergies  Allergen Reactions   Nsaids Shortness Of Breath   Azithromycin Rash   Doxycycline Itching   Januvia [Sitagliptin] Nausea And Vomiting   Sulfa Antibiotics Rash   Aspirin    Augmentin [Amoxicillin-Pot Clavulanate] Other (See Comments)    Insomnia, could not be still    Ketorolac     Other reaction(s): Other (See  Comments) Patient states Burning sensation inside Other reaction(s): Other (See Comments) Patient states Burning sensation inside   Morphine Sulfate Nausea And Vomiting   Nalbuphine     Other reaction(s): Other (See Comments) Burning on inside   Prochlorperazine Edisylate Other (See Comments)    tachycardia   Trazodone Cough   Bupropion Rash   Oxybutynin Rash    Past Surgical History:  Procedure Laterality Date   ABDOMINAL HYSTERECTOMY  1999   APPENDECTOMY      BILATERAL SALPINGOOPHORECTOMY  2010   CHOLECYSTECTOMY  2005   COLONOSCOPY  2016    Social History   Tobacco Use   Smoking status: Never   Smokeless tobacco: Never  Substance Use Topics   Alcohol use: No    Alcohol/week: 0.0 standard drinks   Drug use: No     Medication list has been reviewed and updated.  Current Meds  Medication Sig   albuterol (VENTOLIN HFA) 108 (90 Base) MCG/ACT inhaler INHALE 2 PUFFS BY MOUTH INTO THE LUNGS AS DIRECTED   cefUROXime (CEFTIN) 500 MG tablet Take 1 tablet (500 mg total) by mouth 2 (two) times daily with a meal for 10 days.   clonazePAM (KLONOPIN) 1 MG tablet 2 (two) times daily as needed.    cyclobenzaprine (FLEXERIL) 10 MG tablet TAKE 1 TABLET BY MOUTH 3 TIMES DAILY   escitalopram (LEXAPRO) 20 MG tablet Take 40 mg by mouth daily.   fluconazole (DIFLUCAN) 100 MG tablet Take 1 tablet (100 mg total) by mouth daily for 3 days.   fluticasone (FLONASE) 50 MCG/ACT nasal spray as needed.    fluticasone (FLOVENT HFA) 220 MCG/ACT inhaler Inhale 1 puff into the lungs 2 (two) times daily.   gabapentin (NEURONTIN) 600 MG tablet Take 600 mg by mouth at bedtime.   hydrOXYzine (ATARAX/VISTARIL) 25 MG tablet Take 2 tablets by mouth every 8 (eight) hours as needed.    losartan (COZAAR) 50 MG tablet TAKE 1 TABLET BY MOUTH EVERY DAY   metFORMIN (GLUCOPHAGE) 500 MG tablet TAKE 3 TABLETS BY MOUTH DAILY WITH FOOD   montelukast (SINGULAIR) 10 MG tablet TAKE 1 TABLET BY MOUTH AT BEDTIME   pantoprazole (PROTONIX) 40 MG tablet TAKE 1 TABLET BY MOUTH TWICE DAILY   predniSONE (DELTASONE) 10 MG tablet Take 1 tablet (10 mg total) by mouth as directed for 6 days. Take 6,5,4,3,2,1 then stop   promethazine (PHENERGAN) 25 MG tablet TAKE 1 TABLET BY MOUTH TWICE DAILY AS NEEDED   promethazine-dextromethorphan (PROMETHAZINE-DM) 6.25-15 MG/5ML syrup Take 5 mLs by mouth 4 (four) times daily as needed for cough.   rizatriptan (MAXALT) 10 MG tablet Take 1 tablet (10 mg total) by  mouth as needed for migraine. May repeat in 2 hours if needed   rOPINIRole (REQUIP) 1 MG tablet TAKE 3 TABLETS BY MOUTH AT BEDTIME   sucralfate (CARAFATE) 1 g tablet Take 1 g by mouth 4 (four) times daily.   traMADol (ULTRAM) 50 MG tablet Take 50 mg by mouth every 6 (six) hours as needed.    TRULICITY 1.5 ZO/1.0RU SOPN INJECT 1.31m INTO THE SKIN ONCE A WEEK    PHQ 2/9 Scores 01/05/2021 08/04/2020 03/01/2020 10/23/2019  PHQ - 2 Score 1 2 4 4   PHQ- 9 Score 7 6 13 5     GAD 7 : Generalized Anxiety Score 01/05/2021 08/04/2020 03/01/2020 10/23/2019  Nervous, Anxious, on Edge 1 1 2 2   Control/stop worrying 1 1 0 2  Worry too much - different things 1 1 0 2  Trouble  relaxing 2 0 2 1  Restless 0 0 2 1  Easily annoyed or irritable 2 0 0 2  Afraid - awful might happen 0 0 0 0  Total GAD 7 Score 7 3 6 10   Anxiety Difficulty - - Somewhat difficult Somewhat difficult    BP Readings from Last 3 Encounters:  05/05/21 124/80  01/05/21 112/70  08/04/20 122/82    Physical Exam Constitutional:      Appearance: Normal appearance. She is well-developed.  HENT:     Right Ear: Ear canal and external ear normal. Tympanic membrane is erythematous and retracted.     Left Ear: Ear canal and external ear normal. Tympanic membrane is retracted. Tympanic membrane is not erythematous.     Nose:     Right Sinus: Maxillary sinus tenderness and frontal sinus tenderness present.     Left Sinus: Maxillary sinus tenderness and frontal sinus tenderness present.     Mouth/Throat:     Mouth: No oral lesions.     Pharynx: Uvula midline. Posterior oropharyngeal erythema present. No oropharyngeal exudate.  Cardiovascular:     Rate and Rhythm: Normal rate and regular rhythm.     Pulses: Normal pulses.     Heart sounds: Normal heart sounds.  Pulmonary:     Effort: Pulmonary effort is normal.     Breath sounds: Normal breath sounds. No wheezing or rales.  Musculoskeletal:     Cervical back: Normal range of motion.   Lymphadenopathy:     Cervical: No cervical adenopathy.  Neurological:     Mental Status: She is alert and oriented to person, place, and time.    Wt Readings from Last 3 Encounters:  05/05/21 169 lb 3.2 oz (76.7 kg)  01/05/21 162 lb (73.5 kg)  08/04/20 160 lb (72.6 kg)    BP 124/80    Pulse 79    Temp 99.4 F (37.4 C) (Oral)    Ht 5' 1"  (1.549 m)    Wt 169 lb 3.2 oz (76.7 kg)    SpO2 95%    BMI 31.97 kg/m   Assessment and Plan: 1. Acute non-recurrent maxillary sinusitis Strep negative. Recent RSV treated with cough meds and Doxycycline. Suspect mild thrush. Push fluids, tylenol of fever/HA/myalgia - cefUROXime (CEFTIN) 500 MG tablet; Take 1 tablet (500 mg total) by mouth 2 (two) times daily with a meal for 10 days.  Dispense: 20 tablet; Refill: 0 - predniSONE (DELTASONE) 10 MG tablet; Take 1 tablet (10 mg total) by mouth as directed for 6 days. Take 6,5,4,3,2,1 then stop  Dispense: 21 tablet; Refill: 0 - fluconazole (DIFLUCAN) 100 MG tablet; Take 1 tablet (100 mg total) by mouth daily for 3 days.  Dispense: 3 tablet; Refill: 0 - promethazine-dextromethorphan (PROMETHAZINE-DM) 6.25-15 MG/5ML syrup; Take 5 mLs by mouth 4 (four) times daily as needed for cough.  Dispense: 118 mL; Refill: 0  2. Pharyngitis, unspecified etiology Probably due to thrush Diflucan 100 mg orally daily x 3 days - POCT rapid strep A   Partially dictated using Editor, commissioning. Any errors are unintentional.  Halina Maidens, MD [facility]  05/05/2021

## 2021-05-09 ENCOUNTER — Encounter: Payer: Self-pay | Admitting: Internal Medicine

## 2021-05-10 ENCOUNTER — Ambulatory Visit: Payer: BLUE CROSS/BLUE SHIELD | Admitting: Internal Medicine

## 2021-05-17 ENCOUNTER — Other Ambulatory Visit: Payer: Self-pay | Admitting: Internal Medicine

## 2021-05-17 NOTE — Telephone Encounter (Signed)
Requested Prescriptions  Pending Prescriptions Disp Refills   metFORMIN (GLUCOPHAGE) 500 MG tablet [Pharmacy Med Name: METFORMIN HCL 500 MG TAB] 270 tablet 0    Sig: TAKE 3 TABLETS BY MOUTH DAILY WITH FOOD     Endocrinology:  Diabetes - Biguanides Failed - 05/17/2021 12:01 PM      Failed - HBA1C is between 0 and 7.9 and within 180 days    Hgb A1c MFr Bld  Date Value Ref Range Status  01/05/2021 11.8 (H) 4.8 - 5.6 % Final    Comment:             Prediabetes: 5.7 - 6.4          Diabetes: >6.4          Glycemic control for adults with diabetes: <7.0          Passed - Cr in normal range and within 360 days    Creatinine, Ser  Date Value Ref Range Status  01/05/2021 0.86 0.57 - 1.00 mg/dL Final         Passed - eGFR in normal range and within 360 days    GFR calc Af Amer  Date Value Ref Range Status  08/17/2019 98 >59 mL/min/1.73 Final   GFR calc non Af Amer  Date Value Ref Range Status  08/17/2019 85 >59 mL/min/1.73 Final   eGFR  Date Value Ref Range Status  01/05/2021 81 >59 mL/min/1.73 Final         Passed - Valid encounter within last 6 months    Recent Outpatient Visits          1 week ago Acute non-recurrent maxillary sinusitis   [facility] Glean Hess, MD   4 months ago Annual physical exam   [facility] Glean Hess, MD   9 months ago Type II diabetes mellitus with complication [facility])   [facility] Glean Hess, MD   1 year ago Type II diabetes mellitus with complication [facility])   [facility] Glean Hess, MD   1 year ago Type II diabetes mellitus with complication [facility])   [facility] Glean Hess, MD      Future Appointments            In 7 months [facility] Melia Jesse Sans, MD [facility]

## 2021-05-18 ENCOUNTER — Encounter: Payer: BLUE CROSS/BLUE SHIELD | Admitting: Internal Medicine

## 2021-05-25 ENCOUNTER — Ambulatory Visit: Payer: 59 | Admitting: Internal Medicine

## 2021-05-25 ENCOUNTER — Other Ambulatory Visit: Payer: Self-pay

## 2021-05-25 ENCOUNTER — Encounter: Payer: Self-pay | Admitting: Internal Medicine

## 2021-05-25 VITALS — BP 120/90 | HR 98 | Temp 99.0°F | Ht 61.0 in | Wt 167.6 lb

## 2021-05-25 DIAGNOSIS — J0100 Acute maxillary sinusitis, unspecified: Secondary | ICD-10-CM | POA: Diagnosis not present

## 2021-05-25 DIAGNOSIS — M62830 Muscle spasm of back: Secondary | ICD-10-CM | POA: Diagnosis not present

## 2021-05-25 DIAGNOSIS — R69 Illness, unspecified: Secondary | ICD-10-CM | POA: Diagnosis not present

## 2021-05-25 DIAGNOSIS — F3341 Major depressive disorder, recurrent, in partial remission: Secondary | ICD-10-CM | POA: Diagnosis not present

## 2021-05-25 DIAGNOSIS — E118 Type 2 diabetes mellitus with unspecified complications: Secondary | ICD-10-CM | POA: Diagnosis not present

## 2021-05-25 MED ORDER — FLUCONAZOLE 100 MG PO TABS: 100.0000 mg | ORAL_TABLET | Freq: Once | ORAL | 0 refills | Status: AC

## 2021-05-25 MED ORDER — CYCLOBENZAPRINE HCL 10 MG PO TABS: ORAL_TABLET | ORAL | 0 refills | Status: DC

## 2021-05-25 MED ORDER — CLINDAMYCIN HCL 300 MG PO CAPS: 300.0000 mg | ORAL_CAPSULE | Freq: Three times a day (TID) | ORAL | 0 refills | Status: AC

## 2021-05-25 NOTE — Progress Notes (Signed)
Date:  05/25/2021   Name:  Crystal Rich   DOB:  1965/10/05   MRN:  [id number]   Chief Complaint: Sinusitis  Sinusitis This is a new problem. The current episode started more than 1 month ago. There has been no fever. Associated symptoms include congestion, coughing, ear pain, headaches and sinus pressure. Pertinent negatives include no chills, shortness of breath or sore throat. Past treatments include oral decongestants and acetaminophen. The treatment provided mild relief.  Treated with Doxycycline; then Cefdinir and prednisone 3 weeks ago. Lab Results  Component Value Date   NA 135 01/05/2021   K 4.1 01/05/2021   CO2 23 01/05/2021   GLUCOSE 238 (H) 01/05/2021   BUN 17 01/05/2021   CREATININE 0.86 01/05/2021   CALCIUM 9.8 01/05/2021   EGFR 81 01/05/2021   GFRNONAA 85 08/17/2019   Lab Results  Component Value Date   CHOL 138 01/05/2021   HDL 38 (L) 01/05/2021   LDLCALC 61 01/05/2021   TRIG 244 (H) 01/05/2021   CHOLHDL 3.6 01/05/2021   Lab Results  Component Value Date   TSH 4.690 (H) 01/05/2021   Lab Results  Component Value Date   HGBA1C 11.8 (H) 01/05/2021   Lab Results  Component Value Date   WBC 6.9 01/05/2021   HGB 12.1 01/05/2021   HCT 37.2 01/05/2021   MCV 83 01/05/2021   PLT 255 01/05/2021   Lab Results  Component Value Date   ALT 62 (H) 01/05/2021   AST 89 (H) 01/05/2021   ALKPHOS 148 (H) 01/05/2021   BILITOT 0.4 01/05/2021   No results found for: 25OHVITD2, 25OHVITD3, VD25OH   Review of Systems  Constitutional:  Negative for chills, fatigue and fever.  HENT:  Positive for congestion, ear pain and sinus pressure. Negative for hearing loss and sore throat.   Respiratory:  Positive for cough. Negative for chest tightness, shortness of breath and wheezing.   Cardiovascular:  Negative for chest pain and palpitations.  Neurological:  Positive for headaches. Negative for dizziness and light-headedness.   Patient Active Problem List    Diagnosis Date Noted   Status post total hysterectomy and bilateral salpingo-oophorectomy 03/01/2020   Gastroesophageal reflux disease without esophagitis 12/24/2017   History of Clostridium difficile colitis 04/04/2017   Hemorrhoids 01/31/2017   Restless legs syndrome 12/31/2016   Essential hypertension 12/31/2016   Lymphadenopathy, axillary 04/25/2015   Type II diabetes mellitus with complication ([place]) 38/33/3832   Insomnia 03/11/2015   Chronic abdominal pain 11/17/2014   C. difficile colitis 11/17/2014   Anxiety, generalized 11/17/2014   Depression, major, recurrent, in partial remission ([place]) 11/17/2014   Hot flash, menopausal 11/17/2014   Migraine without aura and responsive to treatment 11/17/2014   Asthma, moderate persistent 11/17/2014   Abdominal pain 04/02/2012    Allergies  Allergen Reactions   Nsaids Shortness Of Breath   Azithromycin Rash   Doxycycline Itching   Januvia [Sitagliptin] Nausea And Vomiting   Sulfa Antibiotics Rash   Aspirin    Augmentin [Amoxicillin-Pot Clavulanate] Other (See Comments)    Insomnia, could not be still    Ketorolac     Other reaction(s): Other (See Comments) Patient states Burning sensation inside Other reaction(s): Other (See Comments) Patient states Burning sensation inside   Morphine Sulfate Nausea And Vomiting   Nalbuphine     Other reaction(s): Other (See Comments) Burning on inside   Prochlorperazine Edisylate Other (See Comments)    tachycardia   Trazodone Cough   Bupropion Rash  Oxybutynin Rash    Past Surgical History:  Procedure Laterality Date   ABDOMINAL HYSTERECTOMY  1999   APPENDECTOMY     BILATERAL SALPINGOOPHORECTOMY  2010   CHOLECYSTECTOMY  2005   COLONOSCOPY  2016    Social History   Tobacco Use   Smoking status: Never   Smokeless tobacco: Never  Substance Use Topics   Alcohol use: No    Alcohol/week: 0.0 standard drinks   Drug use: No     Medication list has been reviewed and  updated.  Current Meds  Medication Sig   albuterol (VENTOLIN HFA) 108 (90 Base) MCG/ACT inhaler INHALE 2 PUFFS BY MOUTH INTO THE LUNGS AS DIRECTED   clindamycin (CLEOCIN) 300 MG capsule Take 1 capsule (300 mg total) by mouth 3 (three) times daily for 10 days.   clonazePAM (KLONOPIN) 1 MG tablet 2 (two) times daily as needed.    cyclobenzaprine (FLEXERIL) 10 MG tablet TAKE 1 TABLET BY MOUTH 3 TIMES DAILY   escitalopram (LEXAPRO) 20 MG tablet Take 40 mg by mouth daily.   fluconazole (DIFLUCAN) 100 MG tablet Take 1 tablet (100 mg total) by mouth once for 1 dose.   fluticasone (FLONASE) 50 MCG/ACT nasal spray as needed.    fluticasone (FLOVENT HFA) 220 MCG/ACT inhaler Inhale 1 puff into the lungs 2 (two) times daily.   gabapentin (NEURONTIN) 600 MG tablet Take 600 mg by mouth at bedtime.   hydrOXYzine (ATARAX/VISTARIL) 25 MG tablet Take 2 tablets by mouth every 8 (eight) hours as needed.    losartan (COZAAR) 50 MG tablet TAKE 1 TABLET BY MOUTH EVERY DAY   metFORMIN (GLUCOPHAGE) 500 MG tablet TAKE 3 TABLETS BY MOUTH DAILY WITH FOOD   montelukast (SINGULAIR) 10 MG tablet TAKE 1 TABLET BY MOUTH AT BEDTIME   pantoprazole (PROTONIX) 40 MG tablet TAKE 1 TABLET BY MOUTH TWICE DAILY   promethazine (PHENERGAN) 25 MG tablet TAKE 1 TABLET BY MOUTH TWICE DAILY AS NEEDED   rizatriptan (MAXALT) 10 MG tablet Take 1 tablet (10 mg total) by mouth as needed for migraine. May repeat in 2 hours if needed   rOPINIRole (REQUIP) 1 MG tablet TAKE 3 TABLETS BY MOUTH AT BEDTIME   sucralfate (CARAFATE) 1 g tablet Take 1 g by mouth 4 (four) times daily.   traMADol (ULTRAM) 50 MG tablet Take 50 mg by mouth every 6 (six) hours as needed.    TRULICITY 1.5 ZO/1.0RU SOPN INJECT 1.$RemoveBefor'5mg'[id number]$  INTO THE SKIN ONCE A WEEK    PHQ 2/9 Scores 05/25/2021 05/05/2021 01/05/2021 08/04/2020  PHQ - 2 Score 0 0 1 2  PHQ- 9 Score 1 0 7 6    GAD 7 : Generalized Anxiety Score 05/25/2021 05/05/2021 01/05/2021 08/04/2020  Nervous, Anxious, on Edge 0 0 1 1   Control/stop worrying 0 0 1 1  Worry too much - different things 0 0 1 1  Trouble relaxing 0 0 2 0  Restless 0 0 0 0  Easily annoyed or irritable 1 0 2 0  Afraid - awful might happen 0 0 0 0  Total GAD 7 Score 1 0 7 3  Anxiety Difficulty Not difficult at all Not difficult at all - -    BP Readings from Last 3 Encounters:  05/25/21 120/90  05/05/21 124/80  01/05/21 112/70    Physical Exam Constitutional:      Appearance: She is well-developed.  HENT:     Right Ear: Ear canal and external ear normal. Tympanic membrane is not erythematous or retracted.  Left Ear: Ear canal and external ear normal. Tympanic membrane is not erythematous or retracted.     Nose:     Right Sinus: Maxillary sinus tenderness and frontal sinus tenderness present.     Left Sinus: Maxillary sinus tenderness and frontal sinus tenderness present.     Mouth/Throat:     Mouth: No oral lesions.     Pharynx: Uvula midline. Posterior oropharyngeal erythema present. No oropharyngeal exudate.  Cardiovascular:     Rate and Rhythm: Normal rate and regular rhythm.     Heart sounds: Normal heart sounds.  Pulmonary:     Breath sounds: Normal breath sounds. No wheezing or rales.  Lymphadenopathy:     Cervical: No cervical adenopathy.  Skin:    Capillary Refill: Capillary refill takes less than 2 seconds.  Neurological:     Mental Status: She is alert and oriented to person, place, and time.    Wt Readings from Last 3 Encounters:  05/25/21 167 lb 9.6 oz (76 kg)  05/05/21 169 lb 3.2 oz (76.7 kg)  01/05/21 162 lb (73.5 kg)    BP 120/90    Pulse 98    Temp 99 F (37.2 C) (Oral)    Ht _0  (1.549 m)    Wt 167 lb 9.6 oz (76 kg)    SpO2 100%    BMI 31.67 kg/m   Assessment and Plan: 1. Acute non-recurrent maxillary sinusitis Will change therapy but no need for prednisone at this time. - clindamycin (CLEOCIN) 300 MG capsule; Take 1 capsule (300 mg total) by mouth 3 (three) times daily for 10 days.  Dispense:  30 capsule; Refill: 0 - fluconazole (DIFLUCAN) 100 MG tablet; Take 1 tablet (100 mg total) by mouth once for 1 dose.  Dispense: 1 tablet; Refill: 0  2. Depression, major, recurrent, in partial remission ([place]) stable  3. Type II diabetes mellitus with complication ([facility]) Not controlled and may be contributing to recurrent infection.  4. Spasm of back muscles Continue Flexeril PRN - cyclobenzaprine (FLEXERIL) 10 MG tablet; TAKE 1 TABLET BY MOUTH 3 TIMES DAILY  Dispense: 90 tablet; Refill: 0   Partially dictated using Editor, commissioning. Any errors are unintentional.  Halina Maidens, MD [facility]  05/25/2021

## 2021-06-09 ENCOUNTER — Encounter: Payer: Self-pay | Admitting: Internal Medicine

## 2021-06-09 ENCOUNTER — Other Ambulatory Visit: Payer: Self-pay

## 2021-06-09 ENCOUNTER — Ambulatory Visit (INDEPENDENT_AMBULATORY_CARE_PROVIDER_SITE_OTHER): Payer: 59 | Admitting: Internal Medicine

## 2021-06-09 VITALS — BP 118/78 | HR 99 | Ht 61.0 in | Wt 168.0 lb

## 2021-06-09 DIAGNOSIS — G43009 Migraine without aura, not intractable, without status migrainosus: Secondary | ICD-10-CM

## 2021-06-09 DIAGNOSIS — N3000 Acute cystitis without hematuria: Secondary | ICD-10-CM | POA: Diagnosis not present

## 2021-06-09 LAB — POCT URINALYSIS DIPSTICK
Bilirubin, UA: NEGATIVE
Blood, UA: NEGATIVE
Glucose, UA: POSITIVE — AB
Ketones, UA: NEGATIVE
Leukocytes, UA: NEGATIVE
Nitrite, UA: NEGATIVE
Protein, UA: NEGATIVE
Spec Grav, UA: 1.01 (ref 1.010–1.025)
Urobilinogen, UA: 0.2 E.U./dL
pH, UA: 6 (ref 5.0–8.0)

## 2021-06-09 MED ORDER — PROMETHAZINE HCL 25 MG PO TABS: 25.0000 mg | ORAL_TABLET | Freq: Two times a day (BID) | ORAL | 0 refills | Status: DC | PRN

## 2021-06-09 MED ORDER — NITROFURANTOIN MONOHYD MACRO 100 MG PO CAPS: 100.0000 mg | ORAL_CAPSULE | Freq: Two times a day (BID) | ORAL | 0 refills | Status: AC

## 2021-06-09 NOTE — Progress Notes (Signed)
Date:  06/09/2021   Name:  Crystal Rich   DOB:  08-26-1965   MRN:  [id number]   Chief Complaint: Urinary Tract Infection  Urinary Tract Infection  This is a new problem. Episode onset: 3 days. The problem has been gradually worsening. The quality of the pain is described as stabbing. The pain is at a severity of 7/10. The pain is moderate. There has been no fever. She is Sexually active. There is No history of pyelonephritis. Associated symptoms include flank pain, frequency, nausea, urgency and vomiting. Pertinent negatives include no chills or hematuria. Treatments tried: azo. The treatment provided mild relief. Her past medical history is significant for recurrent UTIs.   Lab Results  Component Value Date   NA 135 01/05/2021   K 4.1 01/05/2021   CO2 23 01/05/2021   GLUCOSE 238 (H) 01/05/2021   BUN 17 01/05/2021   CREATININE 0.86 01/05/2021   CALCIUM 9.8 01/05/2021   EGFR 81 01/05/2021   GFRNONAA 85 08/17/2019   Lab Results  Component Value Date   CHOL 138 01/05/2021   HDL 38 (L) 01/05/2021   LDLCALC 61 01/05/2021   TRIG 244 (H) 01/05/2021   CHOLHDL 3.6 01/05/2021   Lab Results  Component Value Date   TSH 4.690 (H) 01/05/2021   Lab Results  Component Value Date   HGBA1C 11.8 (H) 01/05/2021   Lab Results  Component Value Date   WBC 6.9 01/05/2021   HGB 12.1 01/05/2021   HCT 37.2 01/05/2021   MCV 83 01/05/2021   PLT 255 01/05/2021   Lab Results  Component Value Date   ALT 62 (H) 01/05/2021   AST 89 (H) 01/05/2021   ALKPHOS 148 (H) 01/05/2021   BILITOT 0.4 01/05/2021   No results found for: 25OHVITD2, 25OHVITD3, VD25OH   Review of Systems  Constitutional:  Negative for chills, fatigue and fever.  Respiratory:  Negative for chest tightness and shortness of breath.   Cardiovascular:  Negative for chest pain.  Gastrointestinal:  Positive for abdominal pain, nausea and vomiting.  Genitourinary:  Positive for flank pain, frequency and urgency. Negative  for dysuria and hematuria.  Psychiatric/Behavioral:  Negative for dysphoric mood and sleep disturbance. The patient is not nervous/anxious.    Patient Active Problem List   Diagnosis Date Noted   Status post total hysterectomy and bilateral salpingo-oophorectomy 03/01/2020   Gastroesophageal reflux disease without esophagitis 12/24/2017   History of Clostridium difficile colitis 04/04/2017   Hemorrhoids 01/31/2017   Restless legs syndrome 12/31/2016   Essential hypertension 12/31/2016   Lymphadenopathy, axillary 04/25/2015   Type II diabetes mellitus with complication ([place]) 32/20/2542   Insomnia 03/11/2015   Chronic abdominal pain 11/17/2014   C. difficile colitis 11/17/2014   Anxiety, generalized 11/17/2014   Depression, major, recurrent, in partial remission ([place]) 11/17/2014   Hot flash, menopausal 11/17/2014   Migraine without aura and responsive to treatment 11/17/2014   Asthma, moderate persistent 11/17/2014   Abdominal pain 04/02/2012    Allergies  Allergen Reactions   Nsaids Shortness Of Breath   Azithromycin Rash   Doxycycline Itching   Januvia [Sitagliptin] Nausea And Vomiting   Sulfa Antibiotics Rash   Aspirin    Augmentin [Amoxicillin-Pot Clavulanate] Other (See Comments)    Insomnia, could not be still    Ketorolac     Other reaction(s): Other (See Comments) Patient states Burning sensation inside Other reaction(s): Other (See Comments) Patient states Burning sensation inside   Morphine Sulfate Nausea And Vomiting  Nalbuphine     Other reaction(s): Other (See Comments) Burning on inside   Prochlorperazine Edisylate Other (See Comments)    tachycardia   Trazodone Cough   Bupropion Rash   Oxybutynin Rash    Past Surgical History:  Procedure Laterality Date   ABDOMINAL HYSTERECTOMY  1999   APPENDECTOMY     BILATERAL SALPINGOOPHORECTOMY  2010   CHOLECYSTECTOMY  2005   COLONOSCOPY  2016    Social History   Tobacco Use   Smoking status: Never    Smokeless tobacco: Never  Substance Use Topics   Alcohol use: No    Alcohol/week: 0.0 standard drinks   Drug use: No     Medication list has been reviewed and updated.  Current Meds  Medication Sig   albuterol (VENTOLIN HFA) 108 (90 Base) MCG/ACT inhaler INHALE 2 PUFFS BY MOUTH INTO THE LUNGS AS DIRECTED   clonazePAM (KLONOPIN) 1 MG tablet 2 (two) times daily as needed.    cyclobenzaprine (FLEXERIL) 10 MG tablet TAKE 1 TABLET BY MOUTH 3 TIMES DAILY   escitalopram (LEXAPRO) 20 MG tablet Take 40 mg by mouth daily.   fluticasone (FLONASE) 50 MCG/ACT nasal spray as needed.    fluticasone (FLOVENT HFA) 220 MCG/ACT inhaler Inhale 1 puff into the lungs 2 (two) times daily.   gabapentin (NEURONTIN) 600 MG tablet Take 600 mg by mouth at bedtime.   hydrOXYzine (ATARAX/VISTARIL) 25 MG tablet Take 2 tablets by mouth every 8 (eight) hours as needed.    losartan (COZAAR) 50 MG tablet TAKE 1 TABLET BY MOUTH EVERY DAY   metFORMIN (GLUCOPHAGE) 500 MG tablet TAKE 3 TABLETS BY MOUTH DAILY WITH FOOD   montelukast (SINGULAIR) 10 MG tablet TAKE 1 TABLET BY MOUTH AT BEDTIME   nitrofurantoin, macrocrystal-monohydrate, (MACROBID) 100 MG capsule Take 1 capsule (100 mg total) by mouth 2 (two) times daily for 7 days.   pantoprazole (PROTONIX) 40 MG tablet TAKE 1 TABLET BY MOUTH TWICE DAILY   rizatriptan (MAXALT) 10 MG tablet Take 1 tablet (10 mg total) by mouth as needed for migraine. May repeat in 2 hours if needed   rOPINIRole (REQUIP) 1 MG tablet TAKE 3 TABLETS BY MOUTH AT BEDTIME   sucralfate (CARAFATE) 1 g tablet Take 1 g by mouth 4 (four) times daily.   traMADol (ULTRAM) 50 MG tablet Take 50 mg by mouth every 6 (six) hours as needed.    TRULICITY 1.5 BW/3.8LH SOPN INJECT 1.48m INTO THE SKIN ONCE A WEEK   [DISCONTINUED] promethazine (PHENERGAN) 25 MG tablet TAKE 1 TABLET BY MOUTH TWICE DAILY AS NEEDED    PHQ 2/9 Scores 06/09/2021 05/25/2021 05/05/2021 01/05/2021  PHQ - 2 Score 0 0 0 1  PHQ- 9 Score 2 1 0 7     GAD 7 : Generalized Anxiety Score 06/09/2021 05/25/2021 05/05/2021 01/05/2021  Nervous, Anxious, on Edge 0 0 0 1  Control/stop worrying 0 0 0 1  Worry too much - different things 0 0 0 1  Trouble relaxing 1 0 0 2  Restless 0 0 0 0  Easily annoyed or irritable 0 1 0 2  Afraid - awful might happen 0 0 0 0  Total GAD 7 Score 1 1 0 7  Anxiety Difficulty - Not difficult at all Not difficult at all -    BP Readings from Last 3 Encounters:  06/09/21 118/78  05/25/21 120/90  05/05/21 124/80    Physical Exam Vitals and nursing note reviewed.  Constitutional:      Appearance: She  is well-developed.  Cardiovascular:     Rate and Rhythm: Normal rate and regular rhythm.     Heart sounds: Normal heart sounds.  Pulmonary:     Effort: Pulmonary effort is normal. No respiratory distress.     Breath sounds: Normal breath sounds.  Abdominal:     General: Bowel sounds are normal.     Palpations: Abdomen is soft.     Tenderness: There is abdominal tenderness in the suprapubic area. There is left CVA tenderness. There is no right CVA tenderness, guarding or rebound.  Skin:    Capillary Refill: Capillary refill takes less than 2 seconds.    Wt Readings from Last 3 Encounters:  06/09/21 168 lb (76.2 kg)  05/25/21 167 lb 9.6 oz (76 kg)  05/05/21 169 lb 3.2 oz (76.7 kg)    BP 118/78 (BP Location: Left Arm, Cuff Size: Normal)    Pulse 99    Ht 5' 1" (1.549 m)    Wt 168 lb (76.2 kg)    SpO2 98%    BMI 31.74 kg/m   Assessment and Plan: 1. Acute cystitis without hematuria Recently completed Cefdinir and then Clindamycin for sinusitis UA unremarkable but sx classic. - nitrofurantoin, macrocrystal-monohydrate, (MACROBID) 100 MG capsule; Take 1 capsule (100 mg total) by mouth 2 (two) times daily for 7 days.  Dispense: 14 capsule; Refill: 0 - POCT urinalysis dipstick  2. Migraine without aura and responsive to treatment - promethazine (PHENERGAN) 25 MG tablet; Take 1 tablet (25 mg total) by mouth  2 (two) times daily as needed.  Dispense: 180 tablet; Refill: 0   Partially dictated using Editor, commissioning. Any errors are unintentional.  Halina Maidens, MD [facility]  06/09/2021

## 2021-06-12 ENCOUNTER — Encounter: Payer: Self-pay | Admitting: Internal Medicine

## 2021-06-13 ENCOUNTER — Encounter: Payer: Self-pay | Admitting: Internal Medicine

## 2021-06-13 DIAGNOSIS — M545 Low back pain, unspecified: Secondary | ICD-10-CM | POA: Diagnosis not present

## 2021-06-13 DIAGNOSIS — R350 Frequency of micturition: Secondary | ICD-10-CM | POA: Diagnosis not present

## 2021-06-13 DIAGNOSIS — R39198 Other difficulties with micturition: Secondary | ICD-10-CM | POA: Diagnosis not present

## 2021-06-13 DIAGNOSIS — E119 Type 2 diabetes mellitus without complications: Secondary | ICD-10-CM | POA: Diagnosis not present

## 2021-06-13 DIAGNOSIS — R3915 Urgency of urination: Secondary | ICD-10-CM | POA: Diagnosis not present

## 2021-06-13 DIAGNOSIS — R1031 Right lower quadrant pain: Secondary | ICD-10-CM | POA: Diagnosis not present

## 2021-06-13 DIAGNOSIS — R110 Nausea: Secondary | ICD-10-CM | POA: Diagnosis not present

## 2021-06-13 DIAGNOSIS — R300 Dysuria: Secondary | ICD-10-CM | POA: Diagnosis not present

## 2021-06-13 DIAGNOSIS — R109 Unspecified abdominal pain: Secondary | ICD-10-CM | POA: Diagnosis not present

## 2021-06-13 DIAGNOSIS — R102 Pelvic and perineal pain: Secondary | ICD-10-CM | POA: Diagnosis not present

## 2021-06-13 DIAGNOSIS — N12 Tubulo-interstitial nephritis, not specified as acute or chronic: Secondary | ICD-10-CM | POA: Diagnosis not present

## 2021-06-16 ENCOUNTER — Other Ambulatory Visit: Payer: Self-pay | Admitting: Internal Medicine

## 2021-06-16 DIAGNOSIS — G2581 Restless legs syndrome: Secondary | ICD-10-CM

## 2021-06-16 NOTE — Telephone Encounter (Signed)
Requested Prescriptions  Pending Prescriptions Disp Refills   rOPINIRole (REQUIP) 1 MG tablet [Pharmacy Med Name: ROPINIROLE HCL 1 MG TAB] 90 tablet 0    Sig: TAKE 3 TABLETS BY MOUTH AT BEDTIME     Neurology:  Parkinsonian Agents Passed - 06/16/2021  9:11 AM      Passed - Last BP in normal range    BP Readings from Last 1 Encounters:  06/09/21 118/78         Passed - Last Heart Rate in normal range    Pulse Readings from Last 1 Encounters:  06/09/21 99         Passed - Valid encounter within last 12 months    Recent Outpatient Visits          1 week ago Acute cystitis without hematuria   [facility] Glean Hess, [facility]   3 weeks ago Acute non-recurrent maxillary sinusitis   [facility] Glean Hess, MD   1 month ago Acute non-recurrent maxillary sinusitis   [facility] Glean Hess, MD   5 months ago Annual physical exam   [facility] Glean Hess, MD   10 months ago Type II diabetes mellitus with complication [facility])   [facility] Glean Hess, MD      Future Appointments            In 3 weeks Army Melia Jesse Sans, MD [facility], [place]   In 6 months Army Melia, Jesse Sans, MD [facility], [facility]

## 2021-07-03 DIAGNOSIS — Z6831 Body mass index (BMI) 31.0-31.9, adult: Secondary | ICD-10-CM | POA: Diagnosis not present

## 2021-07-03 DIAGNOSIS — R1310 Dysphagia, unspecified: Secondary | ICD-10-CM | POA: Diagnosis not present

## 2021-07-03 DIAGNOSIS — Z719 Counseling, unspecified: Secondary | ICD-10-CM | POA: Diagnosis not present

## 2021-07-04 ENCOUNTER — Telehealth: Payer: Self-pay | Admitting: Internal Medicine

## 2021-07-04 DIAGNOSIS — J4540 Moderate persistent asthma, uncomplicated: Secondary | ICD-10-CM

## 2021-07-04 NOTE — Telephone Encounter (Signed)
Requested medications are due for refill today.  yes  Requested medications are on the active medications list.  yes  Last refill. 11/01/2020 1 with 1 refill  Future visit scheduled.   yes  Notes to clinic.  Medication is not delegated.    Requested Prescriptions  Pending Prescriptions Disp Refills   FLOVENT HFA 220 MCG/ACT inhaler [Pharmacy Med Name: Alicia HFA 220 MCG/ACT INH AERO GM] 12 g     Sig: INHALE 1 PUFF BY MOUTH INTO THE LUNGS 2 TIMES A DAY     Not Delegated - Pulmonology:  Corticosteroids 2 Failed - 07/04/2021  8:45 AM      Failed - This refill cannot be delegated      Passed - Valid encounter within last 12 months    Recent Outpatient Visits           3 weeks ago Acute cystitis without hematuria   [facility] Glean Hess, MD   1 month ago Acute non-recurrent maxillary sinusitis   [facility] Glean Hess, MD   2 months ago Acute non-recurrent maxillary sinusitis   [facility] Glean Hess, MD   6 months ago Annual physical exam   [facility] Glean Hess, MD   11 months ago Type II diabetes mellitus with complication [facility])   [facility] Glean Hess, MD       Future Appointments             In 1 week Army Melia Jesse Sans, MD [facility], [place]   In 6 months Army Melia, Jesse Sans, MD [facility], [facility]

## 2021-07-07 ENCOUNTER — Telehealth: Payer: Self-pay

## 2021-07-07 NOTE — Telephone Encounter (Signed)
Pharmacist called in stating the PA was denied, please advise.  ?

## 2021-07-07 NOTE — Telephone Encounter (Signed)
Noted  KP 

## 2021-07-07 NOTE — Telephone Encounter (Addendum)
?  Pharmacy advised patient FLOVENT HFA 220 MCG/ACT inhaler is in need of PA and she is completely out. Would like this expedited.  ? ? ?[facility] [place] - [address] Phone:  [phone]  ?Fax:  [phone]  ?  ? ?

## 2021-07-07 NOTE — Telephone Encounter (Signed)
PA completed waiting on insurance approval. ? ?Key: [id number] ? ?KP ?

## 2021-07-10 ENCOUNTER — Other Ambulatory Visit: Payer: Self-pay | Admitting: Internal Medicine

## 2021-07-10 DIAGNOSIS — J4540 Moderate persistent asthma, uncomplicated: Secondary | ICD-10-CM

## 2021-07-10 MED ORDER — ASMANEX (60 METERED DOSES) 220 MCG/ACT IN AEPB: 2.0000 | INHALATION_SPRAY | Freq: Every day | RESPIRATORY_TRACT | 3 refills | Status: AC

## 2021-07-11 ENCOUNTER — Ambulatory Visit: Payer: 59 | Admitting: Internal Medicine

## 2021-07-14 ENCOUNTER — Other Ambulatory Visit: Payer: Self-pay

## 2021-07-14 ENCOUNTER — Encounter: Payer: Self-pay | Admitting: Internal Medicine

## 2021-07-14 ENCOUNTER — Ambulatory Visit (INDEPENDENT_AMBULATORY_CARE_PROVIDER_SITE_OTHER): Payer: 59 | Admitting: Internal Medicine

## 2021-07-14 VITALS — BP 126/76 | HR 111 | Ht 61.0 in | Wt 164.0 lb

## 2021-07-14 DIAGNOSIS — J4540 Moderate persistent asthma, uncomplicated: Secondary | ICD-10-CM | POA: Diagnosis not present

## 2021-07-14 DIAGNOSIS — M62830 Muscle spasm of back: Secondary | ICD-10-CM

## 2021-07-14 DIAGNOSIS — E118 Type 2 diabetes mellitus with unspecified complications: Secondary | ICD-10-CM | POA: Diagnosis not present

## 2021-07-14 DIAGNOSIS — G2581 Restless legs syndrome: Secondary | ICD-10-CM | POA: Diagnosis not present

## 2021-07-14 DIAGNOSIS — B3731 Acute candidiasis of vulva and vagina: Secondary | ICD-10-CM

## 2021-07-14 DIAGNOSIS — I10 Essential (primary) hypertension: Secondary | ICD-10-CM | POA: Diagnosis not present

## 2021-07-14 MED ORDER — MONTELUKAST SODIUM 10 MG PO TABS: ORAL_TABLET | ORAL | 1 refills | Status: DC

## 2021-07-14 MED ORDER — CYCLOBENZAPRINE HCL 10 MG PO TABS: ORAL_TABLET | ORAL | 1 refills | Status: DC

## 2021-07-14 MED ORDER — LOSARTAN POTASSIUM 50 MG PO TABS: 50.0000 mg | ORAL_TABLET | Freq: Every day | ORAL | 1 refills | Status: DC

## 2021-07-14 MED ORDER — ROPINIROLE HCL 1 MG PO TABS: 3.0000 mg | ORAL_TABLET | Freq: Every day | ORAL | 1 refills | Status: DC

## 2021-07-14 MED ORDER — FLUCONAZOLE 100 MG PO TABS: 100.0000 mg | ORAL_TABLET | Freq: Every day | ORAL | 0 refills | Status: DC

## 2021-07-14 NOTE — Progress Notes (Signed)
Date:  07/14/2021   Name:  Crystal Rich   DOB:  [birth year]   MRN:  [id number]   Chief Complaint: Diabetes and Hypertension  Diabetes She presents for her follow-up diabetic visit. She has type 2 diabetes mellitus. Her disease course has been stable. Pertinent negatives for hypoglycemia include no headaches, nervousness/anxiousness or tremors. Pertinent negatives for diabetes include no chest pain, no fatigue, no polydipsia and no polyuria. Current diabetic treatment includes oral agent (monotherapy) (metformin and Trulicity). She is compliant with treatment all of the time. An ACE inhibitor/angiotensin II receptor blocker is being taken.  Hypertension This is a chronic problem. The current episode started more than 1 year ago. The problem is unchanged. The problem is controlled. Pertinent negatives include no chest pain, headaches, palpitations or shortness of breath.  Asthma She complains of wheezing. There is no cough or shortness of breath. This is a recurrent problem. The problem has been waxing and waning. Pertinent negatives include no appetite change, chest pain, fever, headaches or trouble swallowing. Her symptoms are alleviated by steroid inhaler, leukotriene antagonist and beta-agonist. She reports moderate improvement on treatment. Her past medical history is significant for asthma.   Lab Results  Component Value Date   NA 135 01/05/2021   K 4.1 01/05/2021   CO2 23 01/05/2021   GLUCOSE 238 (H) 01/05/2021   BUN 17 01/05/2021   CREATININE 0.86 01/05/2021   CALCIUM 9.8 01/05/2021   EGFR 81 01/05/2021   GFRNONAA 85 08/17/2019   Lab Results  Component Value Date   CHOL 138 01/05/2021   HDL 38 (L) 01/05/2021   LDLCALC 61 01/05/2021   TRIG 244 (H) 01/05/2021   CHOLHDL 3.6 01/05/2021   Lab Results  Component Value Date   TSH 4.690 (H) 01/05/2021   Lab Results  Component Value Date   HGBA1C 11.8 (H) 01/05/2021   Lab Results  Component Value Date   WBC 6.9  01/05/2021   HGB 12.1 01/05/2021   HCT 37.2 01/05/2021   MCV 83 01/05/2021   PLT 255 01/05/2021   Lab Results  Component Value Date   ALT 62 (H) 01/05/2021   AST 89 (H) 01/05/2021   ALKPHOS 148 (H) 01/05/2021   BILITOT 0.4 01/05/2021   No results found for: 25OHVITD2, 25OHVITD3, VD25OH   Review of Systems  Constitutional:  Negative for appetite change, fatigue, fever and unexpected weight change.  HENT:  Negative for tinnitus and trouble swallowing.   Eyes:  Negative for visual disturbance.  Respiratory:  Positive for wheezing. Negative for cough, chest tightness and shortness of breath.   Cardiovascular:  Negative for chest pain, palpitations and leg swelling.  Gastrointestinal:  Negative for abdominal pain.  Endocrine: Negative for polydipsia and polyuria.  Genitourinary:  Negative for dysuria and hematuria.  Musculoskeletal:  Negative for arthralgias.  Neurological:  Negative for tremors, numbness and headaches.  Psychiatric/Behavioral:  Negative for dysphoric mood and sleep disturbance. The patient is not nervous/anxious.    Patient Active Problem List   Diagnosis Date Noted   Status post total hysterectomy and bilateral salpingo-oophorectomy 03/01/2020   Gastroesophageal reflux disease without esophagitis 12/24/2017   History of Clostridium difficile colitis 04/04/2017   Hemorrhoids 01/31/2017   Restless legs syndrome 12/31/2016   Essential hypertension 12/31/2016   Lymphadenopathy, axillary 04/25/2015   Type II diabetes mellitus with complication ([place]) 52/48/1859   Insomnia 03/11/2015   Chronic abdominal pain 11/17/2014   C. difficile colitis 11/17/2014   Anxiety, generalized 11/17/2014   Depression,  major, recurrent, in partial remission ([place]) 11/17/2014   Hot flash, menopausal 11/17/2014   Migraine without aura and responsive to treatment 11/17/2014   Asthma, moderate persistent 11/17/2014   Abdominal pain 04/02/2012    Allergies  Allergen Reactions    Macrobid [Nitrofurantoin] Other (See Comments)    "Patient could not walk"   Nsaids Shortness Of Breath   Azithromycin Rash   Doxycycline Itching   Januvia [Sitagliptin] Nausea And Vomiting   Sulfa Antibiotics Rash   Aspirin    Augmentin [Amoxicillin-Pot Clavulanate] Other (See Comments)    Insomnia, could not be still    Ketorolac     Other reaction(s): Other (See Comments) Patient states Burning sensation inside Other reaction(s): Other (See Comments) Patient states Burning sensation inside   Morphine Sulfate Nausea And Vomiting   Nalbuphine     Other reaction(s): Other (See Comments) Burning on inside   Prochlorperazine Edisylate Other (See Comments)    tachycardia   Trazodone Cough   Bupropion Rash   Oxybutynin Rash    Past Surgical History:  Procedure Laterality Date   ABDOMINAL HYSTERECTOMY  1999   APPENDECTOMY     BILATERAL SALPINGOOPHORECTOMY  2010   CHOLECYSTECTOMY  2005   COLONOSCOPY  2016    Social History   Tobacco Use   Smoking status: Never   Smokeless tobacco: Never  Substance Use Topics   Alcohol use: No    Alcohol/week: 0.0 standard drinks   Drug use: No     Medication list has been reviewed and updated.  Current Meds  Medication Sig   albuterol (VENTOLIN HFA) 108 (90 Base) MCG/ACT inhaler INHALE 2 PUFFS BY MOUTH INTO THE LUNGS AS DIRECTED   clonazePAM (KLONOPIN) 1 MG tablet 2 (two) times daily as needed.    cyclobenzaprine (FLEXERIL) 10 MG tablet TAKE 1 TABLET BY MOUTH 3 TIMES DAILY   escitalopram (LEXAPRO) 20 MG tablet Take 40 mg by mouth daily.   fluticasone (FLONASE) 50 MCG/ACT nasal spray as needed.    gabapentin (NEURONTIN) 600 MG tablet Take 600 mg by mouth at bedtime.   hydrOXYzine (ATARAX/VISTARIL) 25 MG tablet Take 2 tablets by mouth every 8 (eight) hours as needed.    isosorbide dinitrate (ISORDIL) 10 MG tablet Take 10 mg by mouth 2 (two) times daily.   losartan (COZAAR) 50 MG tablet TAKE 1 TABLET BY MOUTH EVERY DAY   metFORMIN  (GLUCOPHAGE) 500 MG tablet TAKE 3 TABLETS BY MOUTH DAILY WITH FOOD   mometasone (ASMANEX, 60 METERED DOSES,) 220 MCG/ACT inhaler Inhale 2 puffs into the lungs daily.   montelukast (SINGULAIR) 10 MG tablet TAKE 1 TABLET BY MOUTH AT BEDTIME   pantoprazole (PROTONIX) 40 MG tablet TAKE 1 TABLET BY MOUTH TWICE DAILY   promethazine (PHENERGAN) 25 MG tablet Take 1 tablet (25 mg total) by mouth 2 (two) times daily as needed.   rizatriptan (MAXALT) 10 MG tablet Take 1 tablet (10 mg total) by mouth as needed for migraine. May repeat in 2 hours if needed   rOPINIRole (REQUIP) 1 MG tablet TAKE 3 TABLETS BY MOUTH AT BEDTIME   sucralfate (CARAFATE) 1 g tablet Take 1 g by mouth 4 (four) times daily.   traMADol (ULTRAM) 50 MG tablet Take 50 mg by mouth every 6 (six) hours as needed.    TRULICITY 1.5 EH/2.0NO SOPN INJECT 1.43m INTO THE SKIN ONCE A WEEK    PHQ 2/9 Scores 07/14/2021 06/09/2021 05/25/2021 05/05/2021  PHQ - 2 Score 1 0 0 0  PHQ- 9 Score  _0 0    GAD 7 : Generalized Anxiety Score 07/14/2021 06/09/2021 05/25/2021 05/05/2021  Nervous, Anxious, on Edge 1 0 0 0  Control/stop worrying 1 0 0 0  Worry too much - different things 1 0 0 0  Trouble relaxing 2 1 0 0  Restless 0 0 0 0  Easily annoyed or irritable 1 0 1 0  Afraid - awful might happen 0 0 0 0  Total GAD 7 Score _1 0  Anxiety Difficulty Not difficult at all - Not difficult at all Not difficult at all    BP Readings from Last 3 Encounters:  07/14/21 126/76  06/09/21 118/78  05/25/21 120/90    Physical Exam Vitals and nursing note reviewed.  Constitutional:      General: She is not in acute distress.    Appearance: Normal appearance. She is well-developed.  HENT:     Head: Normocephalic and atraumatic.  Cardiovascular:     Rate and Rhythm: Normal rate and regular rhythm.     Heart sounds: No murmur heard. Pulmonary:     Effort: Pulmonary effort is normal. No respiratory distress.     Breath sounds: No wheezing or rhonchi.   Musculoskeletal:     Cervical back: Normal range of motion.     Right lower leg: No edema.     Left lower leg: No edema.  Skin:    General: Skin is warm and dry.     Findings: No rash.  Neurological:     General: No focal deficit present.     Mental Status: She is alert and oriented to person, place, and time.  Psychiatric:        Mood and Affect: Mood normal.        Behavior: Behavior normal.    Wt Readings from Last 3 Encounters:  07/14/21 164 lb (74.4 kg)  06/09/21 168 lb (76.2 kg)  05/25/21 167 lb 9.6 oz (76 kg)    BP 126/76    Pulse (!) 111    Ht _2  (1.549 m)    Wt 164 lb (74.4 kg)    SpO2 98%    BMI 30.99 kg/m   Assessment and Plan: 1. Moderate persistent asthma without complication Insurance does not cover Flovent so new Rx for Asmanex given. Continue singulair and PRN albuterol - montelukast (SINGULAIR) 10 MG tablet; TAKE 1 TABLET BY MOUTH AT BEDTIME  Dispense: 90 tablet; Refill: 1  2. Spasm of back muscles - cyclobenzaprine (FLEXERIL) 10 MG tablet; TAKE 1 TABLET BY MOUTH 3 TIMES DAILY  Dispense: 90 tablet; Refill: 1  3. Essential hypertension Clinically stable exam with well controlled BP. Tolerating medications without side effects at this time. Pt to continue current regimen and low sodium diet; benefits of regular exercise as able discussed. - losartan (COZAAR) 50 MG tablet; Take 1 tablet (50 mg total) by mouth daily.  Dispense: 90 tablet; Refill: 1  4. Restless legs syndrome - rOPINIRole (REQUIP) 1 MG tablet; Take 3 tablets (3 mg total) by mouth at bedtime.  Dispense: 360 tablet; Refill: 1  5. Yeast vaginitis Recurrent due to DM uncontrolled Can take fluconazole 1 tab qod x 3 doses as needed - fluconazole (DIFLUCAN) 100 MG tablet; Take 1 tablet (100 mg total) by mouth daily.  Dispense: 10 tablet; Refill: 0  6. Type II diabetes mellitus with complication ([facility]) BS has been slightly improved with higher dose Trulicity No side effects noted.  Will check  labs then advise  if additional medications or insulin are needed Reminded of the rationale for annual DM eye exams - Hemoglobin A1c - Comprehensive metabolic panel - Microalbumin / creatinine urine ratio   Partially dictated using Editor, commissioning. Any errors are unintentional.  Halina Maidens, MD [facility]  07/14/2021

## 2021-07-14 NOTE — Patient Instructions (Signed)
Kefir drinks - ?

## 2021-07-15 LAB — COMPREHENSIVE METABOLIC PANEL
ALT: 50 IU/L — ABNORMAL HIGH (ref 0–32)
AST: 48 IU/L — ABNORMAL HIGH (ref 0–40)
Albumin/Globulin Ratio: 1.4 (ref 1.2–2.2)
Albumin: 4.3 g/dL (ref 3.8–4.9)
Alkaline Phosphatase: 145 IU/L — ABNORMAL HIGH (ref 44–121)
BUN/Creatinine Ratio: 20 (ref 9–23)
BUN: 17 mg/dL (ref 6–24)
Bilirubin Total: <0.2 mg/dL (ref 0.0–1.2)
CO2: 25 mmol/L (ref 20–29)
Calcium: 9.7 mg/dL (ref 8.7–10.2)
Chloride: 92 mmol/L — ABNORMAL LOW (ref 96–106)
Creatinine, Ser: 0.84 mg/dL (ref 0.57–1.00)
Globulin, Total: 3 g/dL (ref 1.5–4.5)
Glucose: 233 mg/dL — ABNORMAL HIGH (ref 70–99)
Potassium: 4.3 mmol/L (ref 3.5–5.2)
Sodium: 135 mmol/L (ref 134–144)
Total Protein: 7.3 g/dL (ref 6.0–8.5)
eGFR: 83 mL/min/{1.73_m2} (ref >59)

## 2021-07-15 LAB — MICROALBUMIN / CREATININE URINE RATIO
Creatinine, Urine: 118.4 mg/dL
Microalb/Creat Ratio: 65 mg/g creat — ABNORMAL HIGH (ref 0–29)
Microalbumin, Urine: 77.2 ug/mL

## 2021-07-15 LAB — HEMOGLOBIN A1C
Est. average glucose Bld gHb Est-mCnc: 283 mg/dL
Hgb A1c MFr Bld: 11.5 % — ABNORMAL HIGH (ref 4.8–5.6)

## 2021-07-25 ENCOUNTER — Encounter: Payer: Self-pay | Admitting: Internal Medicine

## 2021-07-25 ENCOUNTER — Ambulatory Visit (INDEPENDENT_AMBULATORY_CARE_PROVIDER_SITE_OTHER): Payer: 59 | Admitting: Internal Medicine

## 2021-07-25 ENCOUNTER — Other Ambulatory Visit: Payer: Self-pay

## 2021-07-25 VITALS — BP 128/70 | HR 111 | Ht 61.0 in | Wt 164.0 lb

## 2021-07-25 DIAGNOSIS — E118 Type 2 diabetes mellitus with unspecified complications: Secondary | ICD-10-CM

## 2021-07-25 MED ORDER — PEN NEEDLES 32G X 5 MM MISC: 1.0000 | Freq: Every day | 0 refills | Status: DC

## 2021-07-25 MED ORDER — BASAGLAR KWIKPEN 100 UNIT/ML ~~LOC~~ SOPN: 10.0000 [IU] | PEN_INJECTOR | Freq: Every day | SUBCUTANEOUS | 0 refills | Status: DC

## 2021-07-25 NOTE — Patient Instructions (Signed)
10 units of insulin every morning. ? ?Continue to check blood sugar -in the morning before you eat. ? ?If after 6 weeks your morning blood sugar is till higher than 160, increase the insulin to 15 units. ?

## 2021-07-25 NOTE — Progress Notes (Signed)
? ? ? ?Date:  07/25/2021  ? ?Name:  Crystal Rich   DOB:  Jan 17, 1966   MRN:  [id number] ? ? ?Chief Complaint: Diabetes ? ?Diabetes ?She presents for her follow-up diabetic visit. She has type 2 diabetes mellitus. Her disease course has been worsening. Pertinent negatives for hypoglycemia include no dizziness or headaches. There are no diabetic associated symptoms. Pertinent negatives for diabetes include no chest pain and no fatigue. Patient here today to discuss starting insulin injections and to get instructions on how to use insulin appropriately.  ?She is more determined to improve her DM with lifestyle changes.  We will add insulin to metformin and Trulicity. ? ?Lab Results  ?Component Value Date  ? NA 135 07/14/2021  ? K 4.3 07/14/2021  ? CO2 25 07/14/2021  ? GLUCOSE 233 (H) 07/14/2021  ? BUN 17 07/14/2021  ? CREATININE 0.84 07/14/2021  ? CALCIUM 9.7 07/14/2021  ? EGFR 83 07/14/2021  ? GFRNONAA 85 08/17/2019  ? ?Lab Results  ?Component Value Date  ? CHOL 138 01/05/2021  ? HDL 38 (L) 01/05/2021  ? [place] 61 01/05/2021  ? TRIG 244 (H) 01/05/2021  ? CHOLHDL 3.6 01/05/2021  ? ?Lab Results  ?Component Value Date  ? TSH 4.690 (H) 01/05/2021  ? ?Lab Results  ?Component Value Date  ? HGBA1C 11.5 (H) 07/14/2021  ? ?Lab Results  ?Component Value Date  ? WBC 6.9 01/05/2021  ? HGB 12.1 01/05/2021  ? HCT 37.2 01/05/2021  ? MCV 83 01/05/2021  ? PLT 255 01/05/2021  ? ?Lab Results  ?Component Value Date  ? ALT 50 (H) 07/14/2021  ? AST 48 (H) 07/14/2021  ? ALKPHOS 145 (H) 07/14/2021  ? BILITOT <0.2 07/14/2021  ? ?No results found for: 25OHVITD2, [place], VD25OH  ? ?Review of Systems  ?Constitutional:  Negative for fatigue.  ?Respiratory:  Negative for chest tightness and shortness of breath.   ?Cardiovascular:  Negative for chest pain and palpitations.  ?Neurological:  Negative for dizziness and headaches.  ? ?Patient Active Problem List  ? Diagnosis Date Noted  ? Status post total hysterectomy and bilateral  salpingo-oophorectomy 03/01/2020  ? Gastroesophageal reflux disease without esophagitis 12/24/2017  ? History of Clostridium difficile colitis 04/04/2017  ? Hemorrhoids 01/31/2017  ? Restless legs syndrome 12/31/2016  ? Essential hypertension 12/31/2016  ? Lymphadenopathy, axillary 04/25/2015  ? Type II diabetes mellitus with complication ([place]) 45/85/9292  ? Insomnia 03/11/2015  ? Chronic abdominal pain 11/17/2014  ? C. difficile colitis 11/17/2014  ? Anxiety, generalized 11/17/2014  ? 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 Hot flash, menopausal 11/17/2014  ? Migraine without aura and responsive to treatment 11/17/2014  ? Asthma, moderate persistent 11/17/2014  ? Abdominal pain 04/02/2012  ? ? ?Allergies  ?Allergen Reactions  ? Macrobid [Nitrofurantoin] Other (See Comments)  ?  "Patient could not walk"  ? Nsaids Shortness Of Breath  ? Azithromycin Rash  ? Doxycycline Itching  ? Januvia [Sitagliptin] Nausea And Vomiting  ? Sulfa Antibiotics Rash  ? Aspirin   ? Augmentin [Amoxicillin-Pot Clavulanate] Other (See Comments)  ?  Insomnia, could not be still   ? Ketorolac   ?  Other reaction(s): Other (See Comments) ?Patient states Burning sensation inside ?Other reaction(s): Other (See Comments) ?Patient states Burning sensation inside  ? Morphine Sulfate Nausea And Vomiting  ? Nalbuphine   ?  Other reaction(s): Other (See Comments) ?Burning on inside  ? Prochlorperazine Edisylate Other (See Comments)  ?  tachycardia  ? Trazodone  Cough  ? Bupropion Rash  ? Oxybutynin Rash  ? ? ?Past Surgical History:  ?Procedure Laterality Date  ? ABDOMINAL HYSTERECTOMY  1999  ? APPENDECTOMY    ? BILATERAL SALPINGOOPHORECTOMY  2010  ? CHOLECYSTECTOMY  2005  ? COLONOSCOPY  2016  ? ? ?Social History  ? ?Tobacco Use  ? Smoking status: Never  ? Smokeless tobacco: Never  ?Substance Use Topics  ? Alcohol use: No  ?  Alcohol/week: 0.0 standard drinks  ? Drug use: No  ? ? ? ?Medication list has been reviewed and  updated. ? ?Current Meds  ?Medication Sig  ? albuterol (VENTOLIN HFA) 108 (90 Base) MCG/ACT inhaler INHALE 2 PUFFS BY MOUTH INTO THE LUNGS AS DIRECTED  ? clonazePAM (KLONOPIN) 1 MG tablet 2 (two) times daily as needed.   ? cyclobenzaprine (FLEXERIL) 10 MG tablet TAKE 1 TABLET BY MOUTH 3 TIMES DAILY  ? escitalopram (LEXAPRO) 20 MG tablet Take 40 mg by mouth daily.  ? fluconazole (DIFLUCAN) 100 MG tablet Take 1 tablet (100 mg total) by mouth daily.  ? fluticasone (FLONASE) 50 MCG/ACT nasal spray as needed.   ? gabapentin (NEURONTIN) 600 MG tablet Take 600 mg by mouth at bedtime.  ? hydrOXYzine (ATARAX/VISTARIL) 25 MG tablet Take 2 tablets by mouth every 8 (eight) hours as needed.   ? Insulin Glargine (BASAGLAR KWIKPEN) 100 UNIT/ML Inject 10 Units into the skin daily.  ? Insulin Pen Needle (PEN NEEDLES) 32G X 5 MM MISC 1 each by Does not apply route daily.  ? isosorbide dinitrate (ISORDIL) 10 MG tablet Take 10 mg by mouth 2 (two) times daily.  ? losartan (COZAAR) 50 MG tablet Take 1 tablet (50 mg total) by mouth daily.  ? metFORMIN (GLUCOPHAGE) 500 MG tablet TAKE 3 TABLETS BY MOUTH DAILY WITH FOOD  ? mometasone (ASMANEX, 60 METERED DOSES,) 220 MCG/ACT inhaler Inhale 2 puffs into the lungs daily.  ? montelukast (SINGULAIR) 10 MG tablet TAKE 1 TABLET BY MOUTH AT BEDTIME  ? pantoprazole (PROTONIX) 40 MG tablet TAKE 1 TABLET BY MOUTH TWICE DAILY  ? promethazine (PHENERGAN) 25 MG tablet Take 1 tablet (25 mg total) by mouth 2 (two) times daily as needed.  ? rizatriptan (MAXALT) 10 MG tablet Take 1 tablet (10 mg total) by mouth as needed for migraine. May repeat in 2 hours if needed  ? rOPINIRole (REQUIP) 1 MG tablet Take 3 tablets (3 mg total) by mouth at bedtime.  ? sucralfate (CARAFATE) 1 g tablet Take 1 g by mouth 4 (four) times daily.  ? traMADol (ULTRAM) 50 MG tablet Take 50 mg by mouth every 6 (six) hours as needed.   ? TRULICITY 1.5 BU/3.8GT SOPN INJECT 1.16m INTO THE SKIN ONCE A WEEK  ? ? ?PHQ 2/9 Scores 07/25/2021  07/14/2021 06/09/2021 05/25/2021  ?PHQ - 2 Score 2 1 0 0  ?PHQ- 9 Score _0 ? ? ?GAD 7 : Generalized Anxiety Score 07/25/2021 07/14/2021 06/09/2021 05/25/2021  ?Nervous, Anxious, on Edge 1 1 0 0  ?Control/stop worrying 1 1 0 0  ?Worry too much - different things 1 1 0 0  ?Trouble relaxing _1 0  ?Restless 0 0 0 0  ?Easily annoyed or irritable 1 1 0 1  ?Afraid - awful might happen 0 0 0 0  ?Total GAD 7 Score _2 ?Anxiety Difficulty Not difficult at all Not difficult at all - Not difficult at all  ? ? ?BP Readings from Last 3  Encounters:  ?07/25/21 128/70  ?07/14/21 126/76  ?06/09/21 118/78  ? ? ?Physical Exam ?Vitals and nursing note reviewed.  ?Constitutional:   ?   General: She is not in acute distress. ?   Appearance: She is well-developed.  ?HENT:  ?   Head: Normocephalic and atraumatic.  ?Pulmonary:  ?   Effort: Pulmonary effort is normal. No respiratory distress.  ?Skin: ?   General: Skin is warm and dry.  ?   Findings: No rash.  ?Neurological:  ?   Mental Status: She is alert and oriented to person, place, and time.  ?Psychiatric:     ?   Mood and Affect: Mood normal.     ?   Behavior: Behavior normal.  ? ? ?Wt Readings from Last 3 Encounters:  ?07/25/21 164 lb (74.4 kg)  ?07/14/21 164 lb (74.4 kg)  ?06/09/21 168 lb (76.2 kg)  ? ? ?BP 128/70   Pulse (!) 111   Ht _0  (1.549 m)   Wt 164 lb (74.4 kg)   SpO2 96%   BMI 30.99 kg/m?  ? ?Assessment and Plan: ?1. Type II diabetes mellitus with complication ([place]) ?Continue metformin and Trulicity ?Insulin pen and pen needle use demonstrated with hands on opportunity. ?She was given opportunity to ask questions and feels fairly comfortable with the use of the pen.  She will call if she has questions. ?Will begin with 10 units every AM and aim for fasting BS < 160. ?After 6 weeks if BS is not less than 160, increase to 15 units. ?Follow up in 3 months ?- Insulin Glargine (BASAGLAR KWIKPEN) 100 UNIT/ML; Inject 10 Units into the skin daily.  Dispense: 15 mL;  Refill: 0 ?- Insulin Pen Needle (PEN NEEDLES) 32G X 5 MM MISC; 1 each by Does not apply route daily.  Dispense: 100 each; Refill: 0 ? ? ?Partially dictated using Editor, commissioning. Any errors are unintentional. ? ?Halina Maidens

## 2021-08-02 DIAGNOSIS — F411 Generalized anxiety disorder: Secondary | ICD-10-CM | POA: Diagnosis not present

## 2021-08-02 DIAGNOSIS — R69 Illness, unspecified: Secondary | ICD-10-CM | POA: Diagnosis not present

## 2021-08-02 DIAGNOSIS — G4700 Insomnia, unspecified: Secondary | ICD-10-CM | POA: Diagnosis not present

## 2021-08-08 ENCOUNTER — Other Ambulatory Visit: Payer: Self-pay | Admitting: Internal Medicine

## 2021-08-08 DIAGNOSIS — E118 Type 2 diabetes mellitus with unspecified complications: Secondary | ICD-10-CM

## 2021-08-09 ENCOUNTER — Encounter: Payer: Self-pay | Admitting: Internal Medicine

## 2021-08-09 NOTE — Telephone Encounter (Signed)
Requested Prescriptions  ?Pending Prescriptions Disp Refills  ?? TRULICITY 1.5 MG/0.5ML SOPN [Pharmacy Med Name: TRULICITY 1.5 MG/0.5ML SUBQ SOLN ML] 2 mL 3  ?  Sig: INJECT 1.5mg  INTO THE SKIN ONCE A WEEK  ?  ? Endocrinology:  Diabetes - GLP-1 Receptor Agonists Failed - 08/08/2021  9:34 AM  ?  ?  Failed - HBA1C is between 0 and 7.9 and within 180 days  ?  Hgb A1c MFr Bld  ?Date Value Ref Range Status  ?07/14/2021 11.5 (H) 4.8 - 5.6 % Final  ?  Comment:  ?           Prediabetes: 5.7 - 6.4 ?         Diabetes: >6.4 ?         Glycemic control for adults with diabetes: <7.0 ?  ?   ?  ?  Passed - Valid encounter within last 6 months  ?  Recent Outpatient Visits   ?      ? 2 weeks ago Type II diabetes mellitus with complication (HCC)  ? [facility] Reubin Milan, [facility]  ? 3 weeks ago Yeast vaginitis  ? [facility] Reubin Milan, MD  ? 2 months ago Acute cystitis without hematuria  ? [facility] Reubin Milan, MD  ? 2 months ago Acute non-recurrent maxillary sinusitis  ? [facility] Reubin Milan, MD  ? 3 months ago Acute non-recurrent maxillary sinusitis  ? [facility] Reubin Milan, MD  ?  ?  ?Future Appointments   ?        ? In 2 months [name] [name], MD [facility]  ? In 5 months [name] [name], MD [facility]  ?  ? ?  ?  ?  ? ? ?

## 2021-08-15 ENCOUNTER — Telehealth: Payer: Self-pay | Admitting: Internal Medicine

## 2021-08-15 ENCOUNTER — Other Ambulatory Visit: Payer: Self-pay | Admitting: Internal Medicine

## 2021-08-15 DIAGNOSIS — E118 Type 2 diabetes mellitus with unspecified complications: Secondary | ICD-10-CM

## 2021-08-15 MED ORDER — BASAGLAR KWIKPEN 100 UNIT/ML ~~LOC~~ SOPN
15.0000 [IU] | PEN_INJECTOR | Freq: Every day | SUBCUTANEOUS | 0 refills | Status: DC
Start: 2021-08-15 — End: 2021-09-19

## 2021-08-15 NOTE — Telephone Encounter (Signed)
Please review.  KP

## 2021-08-15 NOTE — Telephone Encounter (Signed)
Copied from [place] [phone]. Topic: General - Other ?>> Aug 15, 2021  9:16 AM Tessa Lerner A wrote: ?Reason for CRM: The patient's been directed by their pharmacy to contact their PCP and request an updated prescription  ? ?The patient's Insulin Glargine (BASAGLAR KWIKPEN) 100 UNIT/ML [id number]  was recently increased ? ?[place] [address] ?Phone: [phone] Fax: [phone] ?Hours: Not open 24 hours ? ?Please contact the patient's pharmacy when possible ? ?The patient would like to be notified when an updated prescription is submitted for them ?

## 2021-08-21 ENCOUNTER — Other Ambulatory Visit: Payer: Self-pay | Admitting: Internal Medicine

## 2021-08-21 DIAGNOSIS — K219 Gastro-esophageal reflux disease without esophagitis: Secondary | ICD-10-CM

## 2021-08-21 NOTE — Telephone Encounter (Signed)
Requested Prescriptions  ?Pending Prescriptions Disp Refills  ?? pantoprazole (PROTONIX) 40 MG tablet [Pharmacy Med Name: PANTOPRAZOLE SODIUM 40 MG DR TAB] 180 tablet 1  ?  Sig: TAKE 1 TABLET BY MOUTH TWICE DAILY  ?  ? Gastroenterology: Proton Pump Inhibitors Passed - 08/21/2021  9:02 AM  ?  ?  Passed - Valid encounter within last 12 months  ?  Recent Outpatient Visits   ?      ? 3 weeks ago Type II diabetes mellitus with complication (HCC)  ? [facility] Reubin Milan, MD  ? 1 month ago Yeast vaginitis  ? [facility] Reubin Milan, MD  ? 2 months ago Acute cystitis without hematuria  ? [facility] Reubin Milan, MD  ? 2 months ago Acute non-recurrent maxillary sinusitis  ? [facility] Reubin Milan, MD  ? 3 months ago Acute non-recurrent maxillary sinusitis  ? [facility] Reubin Milan, MD  ?  ?  ?Future Appointments   ?        ? In 2 months Crystal Graves [name], MD [facility]  ? In 4 months Crystal Graves [name], MD [facility]  ?  ? ?  ?  ?  ? ?

## 2021-08-22 ENCOUNTER — Encounter: Payer: Self-pay | Admitting: Internal Medicine

## 2021-08-22 ENCOUNTER — Telehealth: Payer: Self-pay

## 2021-08-22 NOTE — Telephone Encounter (Signed)
PA completed waiting on insurance approval. ? ?Key: [id number] ? ?KP ?

## 2021-08-24 NOTE — Telephone Encounter (Signed)
Checked PA ? ?Response: ? ?N/Aon April 18 ?Your PA request has been closed. Your request has been aborted. No PA required. Please have Pharmacy process request for 30 days as covered by the plan. ? ?KP ?

## 2021-08-29 ENCOUNTER — Encounter: Payer: Self-pay | Admitting: Internal Medicine

## 2021-09-04 DIAGNOSIS — M1711 Unilateral primary osteoarthritis, right knee: Secondary | ICD-10-CM | POA: Diagnosis not present

## 2021-09-04 DIAGNOSIS — E119 Type 2 diabetes mellitus without complications: Secondary | ICD-10-CM | POA: Diagnosis not present

## 2021-09-12 ENCOUNTER — Other Ambulatory Visit: Payer: Self-pay | Admitting: Internal Medicine

## 2021-09-13 NOTE — Telephone Encounter (Signed)
Requested Prescriptions  ?Pending Prescriptions Disp Refills  ?? metFORMIN (GLUCOPHAGE) 500 MG tablet [Pharmacy Med Name: METFORMIN HCL 500 MG TAB] 270 tablet 1  ?  Sig: TAKE 3 TABLETS BY MOUTH DAILY WITH FOOD  ?  ? Endocrinology:  Diabetes - Biguanides Failed - 09/12/2021  9:24 AM  ?  ?  Failed - HBA1C is between 0 and 7.9 and within 180 days  ?  Hgb A1c MFr Bld  ?Date Value Ref Range Status  ?07/14/2021 11.5 (H) 4.8 - 5.6 % Final  ?  Comment:  ?           Prediabetes: 5.7 - 6.4 ?         Diabetes: >6.4 ?         Glycemic control for adults with diabetes: <7.0 ?  ?   ?  ?  Failed - B12 Level in normal range and within 720 days  ?  No results found for: VITAMINB12   ?  ?  Passed - Cr in normal range and within 360 days  ?  Creatinine, Ser  ?Date Value Ref Range Status  ?07/14/2021 0.84 0.57 - 1.00 mg/dL Final  ?   ?  ?  Passed - eGFR in normal range and within 360 days  ?  GFR calc Af Amer  ?Date Value Ref Range Status  ?08/17/2019 98 >59 mL/min/1.73 Final  ? ?GFR calc non Af Amer  ?Date Value Ref Range Status  ?08/17/2019 85 >59 mL/min/1.73 Final  ? ?eGFR  ?Date Value Ref Range Status  ?07/14/2021 83 >59 mL/min/1.73 Final  ?   ?  ?  Passed - Valid encounter within last 6 months  ?  Recent Outpatient Visits   ?      ? 1 month ago Type II diabetes mellitus with complication ([place])  ? [facility] Glean Hess, MD  ? 2 months ago Yeast vaginitis  ? [facility] Glean Hess, MD  ? 3 months ago Acute cystitis without hematuria  ? [facility] Glean Hess, MD  ? 3 months ago Acute non-recurrent maxillary sinusitis  ? [facility] Glean Hess, MD  ? 4 months ago Acute non-recurrent maxillary sinusitis  ? [facility] Glean Hess, MD  ?  ?  ?Future Appointments   ?        ? In 6 days Glean Hess, MD [facility] [place]  ? In 3 months Glean Hess, MD [facility], [facility]  ?  ? ?  ?  ?  Passed - CBC within normal limits and  completed in the last 12 months  ?  WBC  ?Date Value Ref Range Status  ?01/05/2021 6.9 3.4 - 10.8 x10E3/uL Final  ? ?RBC  ?Date Value Ref Range Status  ?01/05/2021 4.49 3.77 - 5.28 x10E6/uL Final  ? ?Hemoglobin  ?Date Value Ref Range Status  ?01/05/2021 12.1 11.1 - 15.9 g/dL Final  ? ?Hematocrit  ?Date Value Ref Range Status  ?01/05/2021 37.2 34.0 - 46.6 % Final  ? ?MCHC  ?Date Value Ref Range Status  ?01/05/2021 32.5 31.5 - 35.7 g/dL Final  ? ?MCH  ?Date Value Ref Range Status  ?01/05/2021 26.9 26.6 - 33.0 pg Final  ? ?MCV  ?Date Value Ref Range Status  ?01/05/2021 83 79 - 97 fL Final  ? ?No results found for: PLTCOUNTKUC, LABPLAT, [place] ?RDW  ?Date Value Ref Range Status  ?01/05/2021 12.4 11.7 - 15.4 %  Final  ? ?  ?  ?  ? ?

## 2021-09-19 ENCOUNTER — Ambulatory Visit (INDEPENDENT_AMBULATORY_CARE_PROVIDER_SITE_OTHER): Payer: 59 | Admitting: Internal Medicine

## 2021-09-19 ENCOUNTER — Encounter: Payer: Self-pay | Admitting: Internal Medicine

## 2021-09-19 VITALS — BP 126/74 | HR 105 | Temp 98.1°F | Ht 61.0 in | Wt 166.0 lb

## 2021-09-19 DIAGNOSIS — J0100 Acute maxillary sinusitis, unspecified: Secondary | ICD-10-CM

## 2021-09-19 DIAGNOSIS — E118 Type 2 diabetes mellitus with unspecified complications: Secondary | ICD-10-CM | POA: Diagnosis not present

## 2021-09-19 DIAGNOSIS — R051 Acute cough: Secondary | ICD-10-CM

## 2021-09-19 LAB — POCT GLYCOSYLATED HEMOGLOBIN (HGB A1C): Hemoglobin A1C: 10.5 % — AB (ref 4.0–5.6)

## 2021-09-19 LAB — POC COVID19 BINAXNOW: SARS Coronavirus 2 Ag: NEGATIVE

## 2021-09-19 MED ORDER — ALBUTEROL SULFATE HFA 108 (90 BASE) MCG/ACT IN AERS: 2.0000 | INHALATION_SPRAY | RESPIRATORY_TRACT | 5 refills | Status: DC | PRN

## 2021-09-19 MED ORDER — BASAGLAR KWIKPEN 100 UNIT/ML ~~LOC~~ SOPN: 30.0000 [IU] | PEN_INJECTOR | Freq: Every day | SUBCUTANEOUS | 12 refills | Status: DC

## 2021-09-19 MED ORDER — CEFDINIR 300 MG PO CAPS
300.0000 mg | ORAL_CAPSULE | Freq: Two times a day (BID) | ORAL | 0 refills | Status: AC
Start: 2021-09-19 — End: 2021-09-29

## 2021-09-19 MED ORDER — PEN NEEDLES 32G X 5 MM MISC: 1.0000 | Freq: Every day | 0 refills | Status: DC

## 2021-09-19 NOTE — Progress Notes (Signed)
? ? ?Date:  09/19/2021  ? ?Name:  Crystal Rich   DOB:  January 14, 1966   MRN:  [id number] ? ? ?Chief Complaint: Diabetes and Sinusitis ? ?Diabetes ?She presents for her follow-up diabetic visit. She has type 2 diabetes mellitus. Hypoglycemia symptoms include headaches. Pertinent negatives for hypoglycemia include no dizziness or nervousness/anxiousness. There are no diabetic associated symptoms. Pertinent negatives for diabetes include no chest pain. There are no hypoglycemic complications. Current diabetic treatments: added insulin last visit - up to 20 units. Her home blood glucose trend is decreasing steadily.  ?Sinusitis ?This is a new problem. The current episode started yesterday. The problem has been gradually worsening since onset. There has been no fever. Her pain is at a severity of 7/10. The pain is mild. Associated symptoms include chills, congestion, coughing, ear pain, headaches, sinus pressure and a sore throat. Treatments tried: sudafed. The treatment provided no relief.  ? ?Lab Results  ?Component Value Date  ? NA 135 07/14/2021  ? K 4.3 07/14/2021  ? CO2 25 07/14/2021  ? GLUCOSE 233 (H) 07/14/2021  ? BUN 17 07/14/2021  ? CREATININE 0.84 07/14/2021  ? CALCIUM 9.7 07/14/2021  ? EGFR 83 07/14/2021  ? GFRNONAA 85 08/17/2019  ? ?Lab Results  ?Component Value Date  ? CHOL 138 01/05/2021  ? HDL 38 (L) 01/05/2021  ? [place] 61 01/05/2021  ? TRIG 244 (H) 01/05/2021  ? CHOLHDL 3.6 01/05/2021  ? ?Lab Results  ?Component Value Date  ? TSH 4.690 (H) 01/05/2021  ? ?Lab Results  ?Component Value Date  ? HGBA1C 11.5 (H) 07/14/2021  ? ?Lab Results  ?Component Value Date  ? WBC 6.9 01/05/2021  ? HGB 12.1 01/05/2021  ? HCT 37.2 01/05/2021  ? MCV 83 01/05/2021  ? PLT 255 01/05/2021  ? ?Lab Results  ?Component Value Date  ? ALT 50 (H) 07/14/2021  ? AST 48 (H) 07/14/2021  ? ALKPHOS 145 (H) 07/14/2021  ? BILITOT <0.2 07/14/2021  ? ?No results found for: 25OHVITD2, [place], VD25OH  ? ?Review of Systems  ?Constitutional:   Positive for chills.  ?HENT:  Positive for congestion, ear pain, sinus pressure and sore throat.   ?Eyes:  Negative for visual disturbance.  ?Respiratory:  Positive for cough.   ?Cardiovascular:  Negative for chest pain, palpitations and leg swelling.  ?Neurological:  Positive for headaches. Negative for dizziness, light-headedness and numbness.  ?Psychiatric/Behavioral:  Negative for dysphoric mood and sleep disturbance. The patient is not nervous/anxious.   ? ?Patient Active Problem List  ? Diagnosis Date Noted  ? Status post total hysterectomy and bilateral salpingo-oophorectomy 03/01/2020  ? Gastroesophageal reflux disease without esophagitis 12/24/2017  ? History of Clostridium difficile colitis 04/04/2017  ? Hemorrhoids 01/31/2017  ? Restless legs syndrome 12/31/2016  ? Essential hypertension 12/31/2016  ? Lymphadenopathy, axillary 04/25/2015  ? Type II diabetes mellitus with complication ([place]) 18/29/9371  ? Insomnia 03/11/2015  ? Chronic abdominal pain 11/17/2014  ? C. difficile colitis 11/17/2014  ? Anxiety, generalized 11/17/2014  ? 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 Hot flash, menopausal 11/17/2014  ? Migraine without aura and responsive to treatment 11/17/2014  ? Asthma, moderate persistent 11/17/2014  ? Abdominal pain 04/02/2012  ? ? ?Allergies  ?Allergen Reactions  ? Macrobid [Nitrofurantoin] Other (See Comments)  ?  "Patient could not walk"  ? Nsaids Shortness Of Breath  ? Azithromycin Rash  ? Doxycycline Itching  ? Januvia [Sitagliptin] Nausea And Vomiting  ? Sulfa Antibiotics Rash  ? Aspirin   ?  Augmentin [Amoxicillin-Pot Clavulanate] Other (See Comments)  ?  Insomnia, could not be still   ? Ketorolac   ?  Other reaction(s): Other (See Comments) ?Patient states Burning sensation inside ?Other reaction(s): Other (See Comments) ?Patient states Burning sensation inside  ? Morphine Sulfate Nausea And Vomiting  ? Nalbuphine   ?  Other reaction(s): Other (See  Comments) ?Burning on inside  ? Prochlorperazine Edisylate Other (See Comments)  ?  tachycardia  ? Trazodone Cough  ? Bupropion Rash  ? Oxybutynin Rash  ? ? ?Past Surgical History:  ?Procedure Laterality Date  ? APPENDECTOMY    ? BILATERAL SALPINGOOPHORECTOMY  05/07/2008  ? CHOLECYSTECTOMY  05/08/2003  ? COLONOSCOPY  05/07/2014  ? TOTAL ABDOMINAL HYSTERECTOMY  1999  ? ? ?Social History  ? ?Tobacco Use  ? Smoking status: Never  ? Smokeless tobacco: Never  ?Substance Use Topics  ? Alcohol use: No  ?  Alcohol/week: 0.0 standard drinks  ? Drug use: No  ? ? ? ?Medication list has been reviewed and updated. ? ?Current Meds  ?Medication Sig  ? albuterol (VENTOLIN HFA) 108 (90 Base) MCG/ACT inhaler INHALE 2 PUFFS BY MOUTH INTO THE LUNGS AS DIRECTED  ? clonazePAM (KLONOPIN) 1 MG tablet 2 (two) times daily as needed.   ? cyclobenzaprine (FLEXERIL) 10 MG tablet TAKE 1 TABLET BY MOUTH 3 TIMES DAILY  ? escitalopram (LEXAPRO) 20 MG tablet Take 40 mg by mouth daily.  ? fluconazole (DIFLUCAN) 100 MG tablet Take 1 tablet (100 mg total) by mouth daily.  ? fluticasone (FLONASE) 50 MCG/ACT nasal spray as needed.   ? hydrOXYzine (ATARAX/VISTARIL) 25 MG tablet Take 2 tablets by mouth every 8 (eight) hours as needed.   ? Insulin Glargine (BASAGLAR KWIKPEN) 100 UNIT/ML Inject 15-30 Units into the skin daily.  ? Insulin Pen Needle (PEN NEEDLES) 32G X 5 MM MISC 1 each by Does not apply route daily.  ? isosorbide dinitrate (ISORDIL) 10 MG tablet Take 10 mg by mouth 2 (two) times daily.  ? losartan (COZAAR) 50 MG tablet Take 1 tablet (50 mg total) by mouth daily.  ? metFORMIN (GLUCOPHAGE) 500 MG tablet TAKE 3 TABLETS BY MOUTH DAILY WITH FOOD  ? mometasone (ASMANEX, 60 METERED DOSES,) 220 MCG/ACT inhaler Inhale 2 puffs into the lungs daily.  ? montelukast (SINGULAIR) 10 MG tablet TAKE 1 TABLET BY MOUTH AT BEDTIME  ? pantoprazole (PROTONIX) 40 MG tablet TAKE 1 TABLET BY MOUTH TWICE DAILY  ? promethazine (PHENERGAN) 25 MG tablet Take 1 tablet (25  mg total) by mouth 2 (two) times daily as needed.  ? rizatriptan (MAXALT) 10 MG tablet Take 1 tablet (10 mg total) by mouth as needed for migraine. May repeat in 2 hours if needed  ? rOPINIRole (REQUIP) 1 MG tablet Take 3 tablets (3 mg total) by mouth at bedtime. (Patient taking differently: Take 3 mg by mouth at bedtime.)  ? sucralfate (CARAFATE) 1 g tablet Take 1 g by mouth 4 (four) times daily.  ? traMADol (ULTRAM) 50 MG tablet Take 50 mg by mouth every 6 (six) hours as needed.   ? TRULICITY 1.5 IE/3.3IR SOPN INJECT 1.37m INTO THE SKIN ONCE A WEEK  ? ? ? ?  09/19/2021  ? 10:55 AM 07/25/2021  ?  1:20 PM 07/14/2021  ? 10:13 AM 06/09/2021  ?  1:35 PM  ?GAD 7 : Generalized Anxiety Score  ?Nervous, Anxious, on Edge 0 1 1 0  ?Control/stop worrying _0 0  ?Worry too much - different  things _0 0  ?Trouble relaxing 0 _1 ?Restless 1 0 0 0  ?Easily annoyed or irritable _2 0  ?Afraid - awful might happen 1 0 0 0  ?Total GAD 7 Score _3 ?Anxiety Difficulty Not difficult at all Not difficult at all Not difficult at all   ? ? ? ?  09/19/2021  ? 10:55 AM  ?Depression screen PHQ 2/9  ?Decreased Interest 1  ?Down, Depressed, Hopeless 1  ?PHQ - 2 Score 2  ?Altered sleeping 1  ?Tired, decreased energy 1  ?Change in appetite 1  ?Feeling bad or failure about yourself  0  ?Trouble concentrating 0  ?Moving slowly or fidgety/restless 0  ?Suicidal thoughts 0  ?PHQ-9 Score 5  ?Difficult doing work/chores Not difficult at all  ? ? ?BP Readings from Last 3 Encounters:  ?09/19/21 126/74  ?07/25/21 128/70  ?07/14/21 126/76  ? ? ?Physical Exam ?Vitals and nursing note reviewed.  ?Constitutional:   ?   General: She is not in acute distress. ?   Appearance: Normal appearance. She is well-developed.  ?HENT:  ?   Head: Normocephalic and atraumatic.  ?   Right Ear: Tympanic membrane and ear canal normal.  ?   Left Ear: Tympanic membrane and ear canal normal.  ?   Nose:  ?   Right Sinus: Maxillary sinus tenderness and frontal sinus  tenderness present.  ?   Left Sinus: Maxillary sinus tenderness and frontal sinus tenderness present.  ?Cardiovascular:  ?   Rate and Rhythm: Normal rate and regular rhythm.  ?   Pulses: Normal pulses.  ?Pulmonary:

## 2021-09-19 NOTE — Progress Notes (Deleted)
? ? ?Date:  09/19/2021  ? ?Name:  Crystal Rich   DOB:  15-Apr-1966   MRN:  [id number] ? ? ?Chief Complaint: Diabetes and Sinusitis ? ?Diabetes ?She presents for her follow-up diabetic visit. She has type 2 diabetes mellitus.  ?Cough ? ? ?Lab Results  ?Component Value Date  ? NA 135 07/14/2021  ? K 4.3 07/14/2021  ? CO2 25 07/14/2021  ? GLUCOSE 233 (H) 07/14/2021  ? BUN 17 07/14/2021  ? CREATININE 0.84 07/14/2021  ? CALCIUM 9.7 07/14/2021  ? EGFR 83 07/14/2021  ? GFRNONAA 85 08/17/2019  ? ?Lab Results  ?Component Value Date  ? CHOL 138 01/05/2021  ? HDL 38 (L) 01/05/2021  ? [place] 61 01/05/2021  ? TRIG 244 (H) 01/05/2021  ? CHOLHDL 3.6 01/05/2021  ? ?Lab Results  ?Component Value Date  ? TSH 4.690 (H) 01/05/2021  ? ?Lab Results  ?Component Value Date  ? HGBA1C 11.5 (H) 07/14/2021  ? ?Lab Results  ?Component Value Date  ? WBC 6.9 01/05/2021  ? HGB 12.1 01/05/2021  ? HCT 37.2 01/05/2021  ? MCV 83 01/05/2021  ? PLT 255 01/05/2021  ? ?Lab Results  ?Component Value Date  ? ALT 50 (H) 07/14/2021  ? AST 48 (H) 07/14/2021  ? ALKPHOS 145 (H) 07/14/2021  ? BILITOT <0.2 07/14/2021  ? ?No results found for: 25OHVITD2, [place], VD25OH  ? ?Review of Systems  ?Respiratory:  Positive for cough.   ? ?Patient Active Problem List  ? Diagnosis Date Noted  ? Status post total hysterectomy and bilateral salpingo-oophorectomy 03/01/2020  ? Gastroesophageal reflux disease without esophagitis 12/24/2017  ? History of Clostridium difficile colitis 04/04/2017  ? Hemorrhoids 01/31/2017  ? Restless legs syndrome 12/31/2016  ? Essential hypertension 12/31/2016  ? Lymphadenopathy, axillary 04/25/2015  ? Type II diabetes mellitus with complication ([place]) 41/32/4401  ? Insomnia 03/11/2015  ? Chronic abdominal pain 11/17/2014  ? C. difficile colitis 11/17/2014  ? Anxiety, generalized 11/17/2014  ? 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 Hot flash, menopausal 11/17/2014  ? Migraine without aura and responsive to  treatment 11/17/2014  ? Asthma, moderate persistent 11/17/2014  ? Abdominal pain 04/02/2012  ? ? ?Allergies  ?Allergen Reactions  ? Macrobid [Nitrofurantoin] Other (See Comments)  ?  "Patient could not walk"  ? Nsaids Shortness Of Breath  ? Azithromycin Rash  ? Doxycycline Itching  ? Januvia [Sitagliptin] Nausea And Vomiting  ? Sulfa Antibiotics Rash  ? Aspirin   ? Augmentin [Amoxicillin-Pot Clavulanate] Other (See Comments)  ?  Insomnia, could not be still   ? Ketorolac   ?  Other reaction(s): Other (See Comments) ?Patient states Burning sensation inside ?Other reaction(s): Other (See Comments) ?Patient states Burning sensation inside  ? Morphine Sulfate Nausea And Vomiting  ? Nalbuphine   ?  Other reaction(s): Other (See Comments) ?Burning on inside  ? Prochlorperazine Edisylate Other (See Comments)  ?  tachycardia  ? Trazodone Cough  ? Bupropion Rash  ? Oxybutynin Rash  ? ? ?Past Surgical History:  ?Procedure Laterality Date  ? APPENDECTOMY    ? BILATERAL SALPINGOOPHORECTOMY  05/07/2008  ? CHOLECYSTECTOMY  05/08/2003  ? COLONOSCOPY  05/07/2014  ? TOTAL ABDOMINAL HYSTERECTOMY  1999  ? ? ?Social History  ? ?Tobacco Use  ? Smoking status: Never  ? Smokeless tobacco: Never  ?Substance Use Topics  ? Alcohol use: No  ?  Alcohol/week: 0.0 standard drinks  ? Drug use: No  ? ? ? ?Medication list has been  reviewed and updated. ? ?Current Meds  ?Medication Sig  ? albuterol (VENTOLIN HFA) 108 (90 Base) MCG/ACT inhaler INHALE 2 PUFFS BY MOUTH INTO THE LUNGS AS DIRECTED  ? clonazePAM (KLONOPIN) 1 MG tablet 2 (two) times daily as needed.   ? cyclobenzaprine (FLEXERIL) 10 MG tablet TAKE 1 TABLET BY MOUTH 3 TIMES DAILY  ? escitalopram (LEXAPRO) 20 MG tablet Take 40 mg by mouth daily.  ? fluconazole (DIFLUCAN) 100 MG tablet Take 1 tablet (100 mg total) by mouth daily.  ? fluticasone (FLONASE) 50 MCG/ACT nasal spray as needed.   ? hydrOXYzine (ATARAX/VISTARIL) 25 MG tablet Take 2 tablets by mouth every 8 (eight) hours as needed.    ? Insulin Glargine (BASAGLAR KWIKPEN) 100 UNIT/ML Inject 15-30 Units into the skin daily.  ? Insulin Pen Needle (PEN NEEDLES) 32G X 5 MM MISC 1 each by Does not apply route daily.  ? isosorbide dinitrate (ISORDIL) 10 MG tablet Take 10 mg by mouth 2 (two) times daily.  ? losartan (COZAAR) 50 MG tablet Take 1 tablet (50 mg total) by mouth daily.  ? metFORMIN (GLUCOPHAGE) 500 MG tablet TAKE 3 TABLETS BY MOUTH DAILY WITH FOOD  ? mometasone (ASMANEX, 60 METERED DOSES,) 220 MCG/ACT inhaler Inhale 2 puffs into the lungs daily.  ? montelukast (SINGULAIR) 10 MG tablet TAKE 1 TABLET BY MOUTH AT BEDTIME  ? pantoprazole (PROTONIX) 40 MG tablet TAKE 1 TABLET BY MOUTH TWICE DAILY  ? promethazine (PHENERGAN) 25 MG tablet Take 1 tablet (25 mg total) by mouth 2 (two) times daily as needed.  ? rizatriptan (MAXALT) 10 MG tablet Take 1 tablet (10 mg total) by mouth as needed for migraine. May repeat in 2 hours if needed  ? rOPINIRole (REQUIP) 1 MG tablet Take 3 tablets (3 mg total) by mouth at bedtime. (Patient taking differently: Take 3 mg by mouth at bedtime.)  ? sucralfate (CARAFATE) 1 g tablet Take 1 g by mouth 4 (four) times daily.  ? traMADol (ULTRAM) 50 MG tablet Take 50 mg by mouth every 6 (six) hours as needed.   ? TRULICITY 1.5 YS/0.6TK SOPN INJECT 1.59m INTO THE SKIN ONCE A WEEK  ? ? ? ?  09/19/2021  ? 10:55 AM 07/25/2021  ?  1:20 PM 07/14/2021  ? 10:13 AM 06/09/2021  ?  1:35 PM  ?GAD 7 : Generalized Anxiety Score  ?Nervous, Anxious, on Edge 0 1 1 0  ?Control/stop worrying 1 1 1  0  ?Worry too much - different things 1 1 1  0  ?Trouble relaxing 0 2 2 1   ?Restless 1 0 0 0  ?Easily annoyed or irritable 1 1 1  0  ?Afraid - awful might happen 1 0 0 0  ?Total GAD 7 Score 5 6 6 1   ?Anxiety Difficulty Not difficult at all Not difficult at all Not difficult at all   ? ? ? ?  09/19/2021  ? 10:55 AM  ?Depression screen PHQ 2/9  ?Decreased Interest 1  ?Down, Depressed, Hopeless 1  ?PHQ - 2 Score 2  ?Altered sleeping 1  ?Tired, decreased energy  1  ?Change in appetite 1  ?Feeling bad or failure about yourself  0  ?Trouble concentrating 0  ?Moving slowly or fidgety/restless 0  ?Suicidal thoughts 0  ?PHQ-9 Score 5  ?Difficult doing work/chores Not difficult at all  ? ? ?BP Readings from Last 3 Encounters:  ?09/19/21 126/74  ?07/25/21 128/70  ?07/14/21 126/76  ? ? ?Physical Exam ? ?Wt Readings from Last 3 Encounters:  ?  09/19/21 166 lb (75.3 kg)  ?07/25/21 164 lb (74.4 kg)  ?07/14/21 164 lb (74.4 kg)  ? ? ?BP 126/74   Pulse (!) 105   Temp 98.1 ?F (36.7 ?C) (Oral)   Ht 5' 1"  (1.549 m)   Wt 166 lb (75.3 kg)   SpO2 95%   BMI 31.37 kg/m?  ? ?Assessment and Plan: ? ? ? ? ?

## 2021-09-25 ENCOUNTER — Other Ambulatory Visit: Payer: Self-pay

## 2021-09-25 ENCOUNTER — Other Ambulatory Visit: Payer: Self-pay | Admitting: Internal Medicine

## 2021-09-25 ENCOUNTER — Telehealth: Payer: Self-pay | Admitting: Internal Medicine

## 2021-09-25 MED ORDER — FLUCONAZOLE 200 MG PO TABS: 200.0000 mg | ORAL_TABLET | Freq: Every day | ORAL | 0 refills | Status: DC

## 2021-09-25 MED ORDER — LEVOFLOXACIN 500 MG PO TABS: 500.0000 mg | ORAL_TABLET | Freq: Every day | ORAL | 0 refills | Status: DC

## 2021-09-25 NOTE — Telephone Encounter (Signed)
Pharmacy informed not to fill medication.  KP

## 2021-09-25 NOTE — Telephone Encounter (Signed)
Pt informed to completed Cefdinir. She verbalized understanding.  KP

## 2021-09-25 NOTE — Telephone Encounter (Signed)
Judd Lien called to report that there is a potential drug interaction between what was prescribed today and what the patient currently takes. Please advise  [facility] [place] - [address]  [address] [place] [address]  Phone: [phone] Fax: [phone]

## 2021-09-25 NOTE — Telephone Encounter (Signed)
Please review. Spoke to Maralyn Sago she stated that there is a interaction between [employer] and Lexapro.  KP

## 2021-10-03 DIAGNOSIS — F411 Generalized anxiety disorder: Secondary | ICD-10-CM | POA: Diagnosis not present

## 2021-10-03 DIAGNOSIS — G4700 Insomnia, unspecified: Secondary | ICD-10-CM | POA: Diagnosis not present

## 2021-10-03 DIAGNOSIS — R69 Illness, unspecified: Secondary | ICD-10-CM | POA: Diagnosis not present

## 2021-10-05 DIAGNOSIS — R1310 Dysphagia, unspecified: Secondary | ICD-10-CM | POA: Diagnosis not present

## 2021-10-06 ENCOUNTER — Telehealth: Payer: Self-pay | Admitting: Internal Medicine

## 2021-10-06 ENCOUNTER — Other Ambulatory Visit: Payer: Self-pay | Admitting: Internal Medicine

## 2021-10-06 ENCOUNTER — Encounter: Payer: Self-pay | Admitting: Internal Medicine

## 2021-10-06 DIAGNOSIS — E118 Type 2 diabetes mellitus with unspecified complications: Secondary | ICD-10-CM

## 2021-10-06 MED ORDER — OZEMPIC (0.25 OR 0.5 MG/DOSE) 2 MG/3ML ~~LOC~~ SOPN: 0.5000 mg | PEN_INJECTOR | SUBCUTANEOUS | 0 refills | Status: DC

## 2021-10-06 MED ORDER — SEMAGLUTIDE (1 MG/DOSE) 4 MG/3ML ~~LOC~~ SOPN: PEN_INJECTOR | SUBCUTANEOUS | 3 refills | Status: DC

## 2021-10-06 NOTE — Telephone Encounter (Signed)
Pt informed and verbalized understanding.  KP 

## 2021-10-06 NOTE — Telephone Encounter (Signed)
Medication Refill - Medication:  Semaglutide, 1 MG/DOSE, 4 MG/3ML SOPN   Has the patient contacted their pharmacy? Yes.   Pharmacy contacted on behalf of pt:  *Sarah from the pharmacy called in stating there is a situation with the medication Semaglutide, 1 MG/DOSE, 4 MG/3ML SOPN, they are needing two different scripts sent over, due to the pens. She said one needs to say 2MG  .41ml for the first, and the second one can just stay the same but with .5mg .*   Preferred Pharmacy (with phone number or street name):  [facility] [place] - [address]  [address], [place] [facility]  Phone:  [phone]  Fax:  [phone]   Has the patient been seen for an appointment in the last year OR does the patient have an upcoming appointment? Yes.    Agent: Please be advised that RX refills may take up to 3 business days. We ask that you follow-up with your pharmacy.

## 2021-10-09 ENCOUNTER — Telehealth: Payer: Self-pay

## 2021-10-09 DIAGNOSIS — Z9889 Other specified postprocedural states: Secondary | ICD-10-CM | POA: Diagnosis not present

## 2021-10-09 DIAGNOSIS — R109 Unspecified abdominal pain: Secondary | ICD-10-CM | POA: Diagnosis not present

## 2021-10-09 DIAGNOSIS — R1084 Generalized abdominal pain: Secondary | ICD-10-CM | POA: Diagnosis not present

## 2021-10-09 DIAGNOSIS — R112 Nausea with vomiting, unspecified: Secondary | ICD-10-CM | POA: Diagnosis not present

## 2021-10-09 NOTE — Telephone Encounter (Signed)
Completed PA on [email or web address] for Ozempic. per month. 0.5 mg weekly.  (KeyStaci Righter)  Rx #: [id number]  Waiting for outcome.

## 2021-10-10 DIAGNOSIS — R109 Unspecified abdominal pain: Secondary | ICD-10-CM | POA: Diagnosis not present

## 2021-10-13 ENCOUNTER — Other Ambulatory Visit: Payer: Self-pay | Admitting: Internal Medicine

## 2021-10-13 ENCOUNTER — Telehealth: Payer: Self-pay | Admitting: Internal Medicine

## 2021-10-13 DIAGNOSIS — E118 Type 2 diabetes mellitus with unspecified complications: Secondary | ICD-10-CM

## 2021-10-13 MED ORDER — COMFORT EZ PEN NEEDLES 31G X 5 MM MISC: 3 refills | Status: DC

## 2021-10-13 MED ORDER — VICTOZA 18 MG/3ML ~~LOC~~ SOPN: PEN_INJECTOR | SUBCUTANEOUS | 3 refills | Status: DC

## 2021-10-13 NOTE — Telephone Encounter (Signed)
Ozempic is denied.  She has to fail Trulicity and Victoza.  I recommend trying Victoza daily injection.  If agreeable, I will send it in.

## 2021-10-13 NOTE — Telephone Encounter (Signed)
PC to pt, agreed to try Victoza. -Crystal Rich

## 2021-10-18 DIAGNOSIS — R55 Syncope and collapse: Secondary | ICD-10-CM | POA: Diagnosis not present

## 2021-10-18 DIAGNOSIS — W19XXXA Unspecified fall, initial encounter: Secondary | ICD-10-CM | POA: Diagnosis not present

## 2021-10-18 DIAGNOSIS — Z9889 Other specified postprocedural states: Secondary | ICD-10-CM | POA: Diagnosis not present

## 2021-10-18 DIAGNOSIS — Z20822 Contact with and (suspected) exposure to covid-19: Secondary | ICD-10-CM | POA: Diagnosis not present

## 2021-10-18 DIAGNOSIS — K6289 Other specified diseases of anus and rectum: Secondary | ICD-10-CM | POA: Diagnosis not present

## 2021-10-18 DIAGNOSIS — K6389 Other specified diseases of intestine: Secondary | ICD-10-CM | POA: Diagnosis not present

## 2021-10-18 DIAGNOSIS — R112 Nausea with vomiting, unspecified: Secondary | ICD-10-CM | POA: Diagnosis not present

## 2021-10-18 DIAGNOSIS — Z8619 Personal history of other infectious and parasitic diseases: Secondary | ICD-10-CM | POA: Diagnosis not present

## 2021-10-18 DIAGNOSIS — R4182 Altered mental status, unspecified: Secondary | ICD-10-CM | POA: Diagnosis not present

## 2021-10-18 DIAGNOSIS — R197 Diarrhea, unspecified: Secondary | ICD-10-CM | POA: Diagnosis not present

## 2021-10-18 DIAGNOSIS — Z87891 Personal history of nicotine dependence: Secondary | ICD-10-CM | POA: Diagnosis not present

## 2021-10-18 DIAGNOSIS — R1084 Generalized abdominal pain: Secondary | ICD-10-CM | POA: Diagnosis not present

## 2021-10-24 ENCOUNTER — Telehealth: Payer: Self-pay

## 2021-10-24 NOTE — Telephone Encounter (Signed)
PA completed. Waiting on insurance approval.  Key: [facility]  KP

## 2021-10-24 NOTE — Telephone Encounter (Signed)
Approved  KP 

## 2021-10-26 ENCOUNTER — Ambulatory Visit: Payer: 59 | Admitting: Internal Medicine

## 2021-11-02 ENCOUNTER — Encounter: Payer: Self-pay | Admitting: Internal Medicine

## 2021-11-03 DIAGNOSIS — J019 Acute sinusitis, unspecified: Secondary | ICD-10-CM | POA: Diagnosis not present

## 2021-11-08 ENCOUNTER — Encounter: Payer: Self-pay | Admitting: Internal Medicine

## 2021-11-10 ENCOUNTER — Encounter: Payer: Self-pay | Admitting: Internal Medicine

## 2021-11-10 ENCOUNTER — Ambulatory Visit (INDEPENDENT_AMBULATORY_CARE_PROVIDER_SITE_OTHER): Payer: 59 | Admitting: Internal Medicine

## 2021-11-10 VITALS — BP 122/70 | HR 102 | Ht 61.0 in | Wt 164.0 lb

## 2021-11-10 DIAGNOSIS — R55 Syncope and collapse: Secondary | ICD-10-CM | POA: Diagnosis not present

## 2021-11-10 DIAGNOSIS — R0981 Nasal congestion: Secondary | ICD-10-CM | POA: Diagnosis not present

## 2021-11-10 MED ORDER — AMOXICILLIN-POT CLAVULANATE 875-125 MG PO TABS: 1.0000 | ORAL_TABLET | Freq: Two times a day (BID) | ORAL | 0 refills | Status: AC

## 2021-11-10 MED ORDER — FLUTICASONE PROPIONATE 50 MCG/ACT NA SUSP: 2.0000 | Freq: Every day | NASAL | 12 refills | Status: AC

## 2021-11-10 NOTE — Progress Notes (Signed)
Date:  11/10/2021   Name:  Crystal Rich   DOB:  11-09-1965   MRN:  [id number]   Chief Complaint: No chief complaint on file.  Sinusitis This is a recurrent problem. The problem has been waxing and waning since onset. There has been no fever. Associated symptoms include congestion, ear pain, a hoarse voice and sinus pressure. Pertinent negatives include no chills, coughing, headaches or shortness of breath. Past treatments include antibiotics. The treatment provided mild relief.  Loss of Consciousness Associated symptoms include abdominal pain. Pertinent negatives include no chest pain, dizziness, fever, headaches, nausea or vomiting.  ED Course: 2 to 3 weeks of abdominal pain, nausea, vomiting and diarrhea. The patient reports that she developed abdominal pain insidiously, located predominantly to the upper abdomen, constant, nonradiating, not improved by anything, and associated with multiple episodes of diarrhea, increasing severely over the last week to have about 10 episodes daily, as well as some nausea with a few episodes of emesis, but none in the last 24 hours. She continues to have good appetite, is able to tolerate fluids appropriately. Has not had any fevers or chills. Today, had a witnessed episode of loss of consciousness that started while she was standing up, turned around, and before being able to ambulate, became lightheaded, and then became unresponsive, nonverbal. Her husband mentions that the episode lasted at least 10 minutes, was witnessed by EMS when they arrived, and was followed by a complete recovery while she was in the ambulance in route to the hospital. Since, has been at her baseline mentation. No recent syncope or presyncope. No reports of chest pain, palpitations, shortness of breath, headache, focal numbness, tingling or weakness.  Lab Results  Component Value Date   NA 135 07/14/2021   K 4.3 07/14/2021   CO2 25 07/14/2021   GLUCOSE 233 (H) 07/14/2021   BUN  17 07/14/2021   CREATININE 0.84 07/14/2021   CALCIUM 9.7 07/14/2021   EGFR 83 07/14/2021   GFRNONAA 85 08/17/2019   Lab Results  Component Value Date   CHOL 138 01/05/2021   HDL 38 (L) 01/05/2021   LDLCALC 61 01/05/2021   TRIG 244 (H) 01/05/2021   CHOLHDL 3.6 01/05/2021   Lab Results  Component Value Date   TSH 4.690 (H) 01/05/2021   Lab Results  Component Value Date   HGBA1C 10.5 (A) 09/19/2021   Lab Results  Component Value Date   WBC 6.9 01/05/2021   HGB 12.1 01/05/2021   HCT 37.2 01/05/2021   MCV 83 01/05/2021   PLT 255 01/05/2021   Lab Results  Component Value Date   ALT 50 (H) 07/14/2021   AST 48 (H) 07/14/2021   ALKPHOS 145 (H) 07/14/2021   BILITOT <0.2 07/14/2021   No results found for: "25OHVITD2", "25OHVITD3", "VD25OH"   Review of Systems  Constitutional:  Positive for fatigue. Negative for chills and fever.  HENT:  Positive for congestion, ear pain, hoarse voice and sinus pressure. Negative for trouble swallowing.   Respiratory:  Negative for cough, chest tightness and shortness of breath.   Cardiovascular:  Positive for syncope. Negative for chest pain and leg swelling.  Gastrointestinal:  Positive for abdominal pain. Negative for nausea and vomiting.  Neurological:  Negative for dizziness and headaches.  Psychiatric/Behavioral:  Positive for dysphoric mood. Negative for sleep disturbance. The patient is nervous/anxious.     Patient Active Problem List   Diagnosis Date Noted   Status post total hysterectomy and bilateral salpingo-oophorectomy 03/01/2020  Gastroesophageal reflux disease without esophagitis 12/24/2017   History of Clostridium difficile colitis 04/04/2017   Hemorrhoids 01/31/2017   Restless legs syndrome 12/31/2016   Essential hypertension 12/31/2016   Lymphadenopathy, axillary 04/25/2015   Type II diabetes mellitus with complication ([place]) 78/46/9629   Insomnia 03/11/2015   Chronic abdominal pain 11/17/2014   C. difficile colitis  11/17/2014   Anxiety, generalized 11/17/2014   Depression, major, recurrent, in partial remission ([place]) 11/17/2014   Hot flash, menopausal 11/17/2014   Migraine without aura and responsive to treatment 11/17/2014   Asthma, moderate persistent 11/17/2014   Abdominal pain 04/02/2012    Allergies  Allergen Reactions   Macrobid [Nitrofurantoin] Other (See Comments)    "Patient could not walk"   Nsaids Shortness Of Breath   Azithromycin Rash   Doxycycline Itching   Januvia [Sitagliptin] Nausea And Vomiting   Sulfa Antibiotics Rash   Aspirin    Augmentin [Amoxicillin-Pot Clavulanate] Other (See Comments)    Insomnia, could not be still    Ketorolac     Other reaction(s): Other (See Comments) Patient states Burning sensation inside Other reaction(s): Other (See Comments) Patient states Burning sensation inside   Morphine Sulfate Nausea And Vomiting   Nalbuphine     Other reaction(s): Other (See Comments) Burning on inside   Prochlorperazine Edisylate Other (See Comments)    tachycardia   Trazodone Cough   Bupropion Rash   Oxybutynin Rash    Past Surgical History:  Procedure Laterality Date   APPENDECTOMY     BILATERAL SALPINGOOPHORECTOMY  05/07/2008   CHOLECYSTECTOMY  05/08/2003   COLONOSCOPY  05/07/2014   TOTAL ABDOMINAL HYSTERECTOMY  1999    Social History   Tobacco Use   Smoking status: Never   Smokeless tobacco: Never  Substance Use Topics   Alcohol use: No    Alcohol/week: 0.0 standard drinks of alcohol   Drug use: No     Medication list has been reviewed and updated.  Current Meds  Medication Sig   albuterol (VENTOLIN HFA) 108 (90 Base) MCG/ACT inhaler Inhale 2 puffs into the lungs every 4 (four) hours as needed for wheezing or shortness of breath.   clonazePAM (KLONOPIN) 1 MG tablet 2 (two) times daily as needed.    COMFORT EZ PEN NEEDLES 31G X 5 MM MISC USE 2 EACH DAILY   cyclobenzaprine (FLEXERIL) 10 MG tablet TAKE 1 TABLET BY MOUTH 3 TIMES DAILY    escitalopram (LEXAPRO) 20 MG tablet Take 40 mg by mouth daily.   fluticasone (FLONASE) 50 MCG/ACT nasal spray as needed.    gabapentin (NEURONTIN) 600 MG tablet Take 600 mg by mouth at bedtime.   hydrOXYzine (ATARAX/VISTARIL) 25 MG tablet Take 2 tablets by mouth every 8 (eight) hours as needed.    Insulin Glargine (BASAGLAR KWIKPEN) 100 UNIT/ML Inject 30 Units into the skin daily.   isosorbide dinitrate (ISORDIL) 10 MG tablet Take 10 mg by mouth 2 (two) times daily.   liraglutide (VICTOZA) 18 MG/3ML SOPN Inject 0.6 mg into the skin daily at 6 (six) AM for 7 days, THEN 1.2 mg daily at 6 (six) AM for 7 days, THEN 1.8 mg daily at 6 (six) AM. Start 0.68m SQ once a day for 7 days, then increase to 1.230monce a day.   losartan (COZAAR) 50 MG tablet Take 1 tablet (50 mg total) by mouth daily.   metFORMIN (GLUCOPHAGE) 500 MG tablet TAKE 3 TABLETS BY MOUTH DAILY WITH FOOD   mometasone (ASMANEX, 60 METERED DOSES,) 220 MCG/ACT inhaler Inhale  2 puffs into the lungs daily.   montelukast (SINGULAIR) 10 MG tablet TAKE 1 TABLET BY MOUTH AT BEDTIME   pantoprazole (PROTONIX) 40 MG tablet TAKE 1 TABLET BY MOUTH TWICE DAILY   promethazine (PHENERGAN) 25 MG tablet Take 1 tablet (25 mg total) by mouth 2 (two) times daily as needed.   rizatriptan (MAXALT) 10 MG tablet Take 1 tablet (10 mg total) by mouth as needed for migraine. May repeat in 2 hours if needed   rOPINIRole (REQUIP) 1 MG tablet Take 3 tablets (3 mg total) by mouth at bedtime. (Patient taking differently: Take 3 mg by mouth at bedtime.)   sucralfate (CARAFATE) 1 g tablet Take 1 g by mouth 4 (four) times daily.   traMADol (ULTRAM) 50 MG tablet Take 50 mg by mouth every 6 (six) hours as needed.        11/10/2021   11:00 AM 09/19/2021   10:55 AM 07/25/2021    1:20 PM 07/14/2021   10:13 AM  GAD 7 : Generalized Anxiety Score  Nervous, Anxious, on Edge 0 0 1 1  Control/stop worrying 0 1 1 1   Worry too much - different things 1 1 1 1   Trouble relaxing 1 0 2  2  Restless 0 1 0 0  Easily annoyed or irritable 1 1 1 1   Afraid - awful might happen 0 1 0 0  Total GAD 7 Score 3 5 6 6   Anxiety Difficulty Somewhat difficult Not difficult at all Not difficult at all Not difficult at all       11/10/2021   11:00 AM 09/19/2021   10:55 AM 07/25/2021    1:20 PM  Depression screen PHQ 2/9  Decreased Interest 1 1 1   Down, Depressed, Hopeless 1 1 1   PHQ - 2 Score 2 2 2   Altered sleeping 1 1 1   Tired, decreased energy 1 1 1   Change in appetite 1 1 1   Feeling bad or failure about yourself  0 0 0  Trouble concentrating 1 0 1  Moving slowly or fidgety/restless 0 0 0  Suicidal thoughts 0 0 0  PHQ-9 Score 6 5 6   Difficult doing work/chores Somewhat difficult Not difficult at all Not difficult at all    BP Readings from Last 3 Encounters:  11/10/21 122/70  09/19/21 126/74  07/25/21 128/70    Physical Exam Vitals and nursing note reviewed.  Constitutional:      General: She is not in acute distress.    Appearance: Normal appearance. She is well-developed.  HENT:     Head: Normocephalic and atraumatic.     Right Ear: Tympanic membrane and ear canal normal.     Left Ear: Tympanic membrane and ear canal normal.     Nose:     Right Sinus: No maxillary sinus tenderness or frontal sinus tenderness.     Left Sinus: No maxillary sinus tenderness or frontal sinus tenderness.     Mouth/Throat:     Pharynx: No posterior oropharyngeal erythema.  Cardiovascular:     Rate and Rhythm: Normal rate and regular rhythm.  Pulmonary:     Effort: Pulmonary effort is normal. No respiratory distress.     Breath sounds: No wheezing or rhonchi.  Musculoskeletal:     Cervical back: Normal range of motion.  Lymphadenopathy:     Cervical: No cervical adenopathy.  Skin:    General: Skin is warm and dry.     Findings: No rash.  Neurological:     Mental Status:  She is alert and oriented to person, place, and time.  Psychiatric:        Mood and Affect: Mood normal.         Behavior: Behavior normal.     Wt Readings from Last 3 Encounters:  11/10/21 164 lb (74.4 kg)  09/19/21 166 lb (75.3 kg)  07/25/21 164 lb (74.4 kg)    BP 122/70   Pulse (!) 102   Ht 5' 1"  (1.549 m)   Wt 164 lb (74.4 kg)   SpO2 97%   BMI 30.99 kg/m   Assessment and Plan: 1. Syncope and collapse Of unclear cause - doubt purely dehydration If recurrent, recommend ED evaluation and consider Neurology evaluation  2. Sinus congestion Recurrent sinus symptoms despite recent antibiotics Will give Augmentin and refer - Ambulatory referral to ENT - fluticasone (FLONASE) 50 MCG/ACT nasal spray; Place 2 sprays into both nostrils daily.  Dispense: 16 g; Refill: 12 - amoxicillin-clavulanate (AUGMENTIN) 875-125 MG tablet; Take 1 tablet by mouth 2 (two) times daily for 10 days.  Dispense: 20 tablet; Refill: 0   Partially dictated using Editor, commissioning. Any errors are unintentional.  Halina Maidens, MD [facility]  11/10/2021

## 2021-11-16 ENCOUNTER — Other Ambulatory Visit: Payer: Self-pay

## 2021-11-16 MED ORDER — FLUCONAZOLE 200 MG PO TABS: 200.0000 mg | ORAL_TABLET | Freq: Every day | ORAL | 0 refills | Status: DC

## 2021-11-17 NOTE — Telephone Encounter (Signed)
PA APPROVED  

## 2021-11-24 ENCOUNTER — Other Ambulatory Visit: Payer: Self-pay | Admitting: Internal Medicine

## 2021-11-24 DIAGNOSIS — K219 Gastro-esophageal reflux disease without esophagitis: Secondary | ICD-10-CM

## 2021-11-24 NOTE — Telephone Encounter (Signed)
Requested Prescriptions  Pending Prescriptions Disp Refills  . pantoprazole (PROTONIX) 40 MG tablet [Pharmacy Med Name: PANTOPRAZOLE SODIUM 40 MG DR TAB] 180 tablet 0    Sig: TAKE 1 TABLET BY MOUTH TWICE DAILY     Gastroenterology: Proton Pump Inhibitors Passed - 11/24/2021  9:42 AM      Passed - Valid encounter within last 12 months    Recent Outpatient Visits          2 weeks ago Syncope and collapse   [facility] Reubin Milan, MD   2 months ago Type II diabetes mellitus with complication [facility])   [facility] Reubin Milan, MD   4 months ago Type II diabetes mellitus with complication [facility])   [facility] Reubin Milan, MD   4 months ago Yeast vaginitis   [facility] Reubin Milan, MD   5 months ago Acute cystitis without hematuria   [facility] Reubin Milan, MD      Future Appointments            In 4 weeks [name] [name], MD [facility]   In 1 month [name], [name], MD [facility], [facility]

## 2021-12-14 DIAGNOSIS — F410 Panic disorder [episodic paroxysmal anxiety] without agoraphobia: Secondary | ICD-10-CM | POA: Diagnosis not present

## 2021-12-14 DIAGNOSIS — Z79899 Other long term (current) drug therapy: Secondary | ICD-10-CM | POA: Diagnosis not present

## 2021-12-14 DIAGNOSIS — F411 Generalized anxiety disorder: Secondary | ICD-10-CM | POA: Diagnosis not present

## 2021-12-14 DIAGNOSIS — G4700 Insomnia, unspecified: Secondary | ICD-10-CM | POA: Diagnosis not present

## 2021-12-14 DIAGNOSIS — R69 Illness, unspecified: Secondary | ICD-10-CM | POA: Diagnosis not present

## 2021-12-22 ENCOUNTER — Ambulatory Visit: Payer: 59 | Admitting: Internal Medicine

## 2021-12-22 DIAGNOSIS — J324 Chronic pansinusitis: Secondary | ICD-10-CM | POA: Diagnosis not present

## 2021-12-22 DIAGNOSIS — H903 Sensorineural hearing loss, bilateral: Secondary | ICD-10-CM | POA: Diagnosis not present

## 2021-12-25 DIAGNOSIS — R197 Diarrhea, unspecified: Secondary | ICD-10-CM | POA: Diagnosis not present

## 2021-12-25 DIAGNOSIS — R1310 Dysphagia, unspecified: Secondary | ICD-10-CM | POA: Diagnosis not present

## 2022-01-09 ENCOUNTER — Encounter: Payer: Self-pay | Admitting: Internal Medicine

## 2022-01-09 ENCOUNTER — Encounter: Payer: BLUE CROSS/BLUE SHIELD | Admitting: Internal Medicine

## 2022-01-09 DIAGNOSIS — E785 Hyperlipidemia, unspecified: Secondary | ICD-10-CM | POA: Insufficient documentation

## 2022-01-09 NOTE — Progress Notes (Deleted)
Date:  01/09/2022   Name:  Crystal Rich   DOB:  Jul 22, 1965   MRN:  [id number]   Chief Complaint: No chief complaint on file. Crystal Rich is a 56 y.o. female who presents today for her Complete Annual Exam. She feels {DESC; WELL/FAIRLY WELL/POORLY:18703}. She reports exercising ***. She reports she is sleeping {DESC; WELL/FAIRLY WELL/POORLY:18703}. Breast complaints ***.  Mammogram: 01/2021 [facility] DEXA: none Pap smear: discontinued Colonoscopy: 12/2014 repeat 10 yrs  Health Maintenance Due  Topic Date Due   COVID-19 Vaccine (1) Never done   HIV Screening  Never done   Zoster Vaccines- Shingrix (1 of 2) Never done   OPHTHALMOLOGY EXAM  07/20/2020   INFLUENZA VACCINE  12/05/2021   FOOT EXAM  01/05/2022    Immunization History  Administered Date(s) Administered   Influenza-Unspecified 04/15/2018   Pneumococcal Polysaccharide-23 08/17/2019   Tdap 03/15/2008, 09/19/2012    Hypertension This is a chronic problem. The problem is controlled. Pertinent negatives include no chest pain, headaches, palpitations or shortness of breath. Past treatments include angiotensin blockers and direct vasodilators. The current treatment provides moderate improvement. There is no history of kidney disease, CAD/MI or CVA.  Diabetes She presents for her follow-up diabetic visit. She has type 2 diabetes mellitus. Pertinent negatives for hypoglycemia include no dizziness, headaches, nervousness/anxiousness or tremors. Pertinent negatives for diabetes include no chest pain, no fatigue, no polydipsia and no polyuria. Pertinent negatives for diabetic complications include no CVA. Current diabetic treatments: metformin, Victoza, Basaglar. Her weight is stable.    Lab Results  Component Value Date   NA 135 07/14/2021   K 4.3 07/14/2021   CO2 25 07/14/2021   GLUCOSE 233 (H) 07/14/2021   BUN 17 07/14/2021   CREATININE 0.84 07/14/2021   CALCIUM 9.7 07/14/2021   EGFR 83 07/14/2021   GFRNONAA 85  08/17/2019   Lab Results  Component Value Date   CHOL 138 01/05/2021   HDL 38 (L) 01/05/2021   LDLCALC 61 01/05/2021   TRIG 244 (H) 01/05/2021   CHOLHDL 3.6 01/05/2021   Lab Results  Component Value Date   TSH 4.690 (H) 01/05/2021   Lab Results  Component Value Date   HGBA1C 10.5 (A) 09/19/2021   Lab Results  Component Value Date   WBC 6.9 01/05/2021   HGB 12.1 01/05/2021   HCT 37.2 01/05/2021   MCV 83 01/05/2021   PLT 255 01/05/2021   Lab Results  Component Value Date   ALT 50 (H) 07/14/2021   AST 48 (H) 07/14/2021   ALKPHOS 145 (H) 07/14/2021   BILITOT <0.2 07/14/2021   No results found for: "25OHVITD2", "25OHVITD3", "VD25OH"   Review of Systems  Constitutional:  Negative for chills, fatigue and fever.  HENT:  Negative for congestion, hearing loss, tinnitus, trouble swallowing and voice change.   Eyes:  Negative for visual disturbance.  Respiratory:  Negative for cough, chest tightness, shortness of breath and wheezing.   Cardiovascular:  Negative for chest pain, palpitations and leg swelling.  Gastrointestinal:  Negative for abdominal pain, constipation, diarrhea and vomiting.  Endocrine: Negative for polydipsia and polyuria.  Genitourinary:  Negative for dysuria, frequency, genital sores, vaginal bleeding and vaginal discharge.  Musculoskeletal:  Negative for arthralgias, gait problem and joint swelling.  Skin:  Negative for color change and rash.  Neurological:  Negative for dizziness, tremors, light-headedness and headaches.  Hematological:  Negative for adenopathy. Does not bruise/bleed easily.  Psychiatric/Behavioral:  Negative for dysphoric mood and sleep disturbance. The patient is  not nervous/anxious.     Patient Active Problem List   Diagnosis Date Noted   Status post total hysterectomy and bilateral salpingo-oophorectomy 03/01/2020   Gastroesophageal reflux disease without esophagitis 12/24/2017   History of Clostridium difficile colitis 04/04/2017    Hemorrhoids 01/31/2017   Restless legs syndrome 12/31/2016   Essential hypertension 12/31/2016   Lymphadenopathy, axillary 04/25/2015   Type II diabetes mellitus with complication ([place]) 16/02/9603   Insomnia 03/11/2015   Chronic abdominal pain 11/17/2014   C. 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   Hot flash, menopausal 11/17/2014   Migraine without aura and responsive to treatment 11/17/2014   Asthma, moderate persistent 11/17/2014   Abdominal pain 04/02/2012    Allergies  Allergen Reactions   Macrobid [Nitrofurantoin] Other (See Comments)    "Patient could not walk"   Nsaids Shortness Of Breath   Doxycycline Itching   Januvia [Sitagliptin] Nausea And Vomiting   Sulfa Antibiotics Rash   Aspirin    Ketorolac     Other reaction(s): Other (See Comments) Patient states Burning sensation inside Other reaction(s): Other (See Comments) Patient states Burning sensation inside   Morphine Sulfate Nausea And Vomiting   Nalbuphine     Other reaction(s): Other (See Comments) Burning on inside   Prochlorperazine Edisylate Other (See Comments)    tachycardia   Trazodone Cough   Bupropion Rash   Oxybutynin Rash    Past Surgical History:  Procedure Laterality Date   APPENDECTOMY     BILATERAL SALPINGOOPHORECTOMY  05/07/2008   CHOLECYSTECTOMY  05/08/2003   COLONOSCOPY  05/07/2014   TOTAL ABDOMINAL HYSTERECTOMY  1999    Social History   Tobacco Use   Smoking status: Never   Smokeless tobacco: Never  Substance Use Topics   Alcohol use: No    Alcohol/week: 0.0 standard drinks of alcohol   Drug use: No     Medication list has been reviewed and updated.  No outpatient medications have been marked as taking for the 01/09/22 encounter (Appointment) with Glean Hess, MD.       11/10/2021   11:00 AM 09/19/2021   10:55 AM 07/25/2021    1:20 PM 07/14/2021   10:13 AM  GAD 7 :  Generalized Anxiety Score  Nervous, Anxious, on Edge 0 0 1 1  Control/stop worrying 0 _0 Worry too much - different things _1 Trouble relaxing 1 0 2 2  Restless 0 1 0 0  Easily annoyed or irritable _2 Afraid - awful might happen 0 1 0 0  Total GAD 7 Score _3 Anxiety Difficulty Somewhat difficult Not difficult at all Not difficult at all Not difficult at all       11/10/2021   11:00 AM 09/19/2021   10:55 AM 07/25/2021    1:20 PM  Depression screen PHQ 2/9  Decreased Interest _4 Down, Depressed, Hopeless _5 PHQ - 2 Score _6 Altered sleeping _7 Tired, decreased energy _8 Change in appetite _9 Feeling bad or failure about yourself  0 0 0  Trouble concentrating 1 0 1  Moving slowly or fidgety/restless 0 0 0  Suicidal thoughts 0 0 0  PHQ-9 Score _10 Difficult doing work/chores Somewhat difficult Not difficult at all Not difficult at all  BP Readings from Last 3 Encounters:  11/10/21 122/70  09/19/21 126/74  07/25/21 128/70    Physical Exam Vitals and nursing note reviewed.  Constitutional:      General: She is not in acute distress.    Appearance: She is well-developed.  HENT:     Head: Normocephalic and atraumatic.     Right Ear: Tympanic membrane and ear canal normal.     Left Ear: Tympanic membrane and ear canal normal.     Nose:     Right Sinus: No maxillary sinus tenderness.     Left Sinus: No maxillary sinus tenderness.  Eyes:     General: No scleral icterus.       Right eye: No discharge.        Left eye: No discharge.     Conjunctiva/sclera: Conjunctivae normal.  Neck:     Thyroid: No thyromegaly.     Vascular: No carotid bruit.  Cardiovascular:     Rate and Rhythm: Normal rate and regular rhythm.     Pulses: Normal pulses.     Heart sounds: Normal heart sounds.  Pulmonary:     Effort: Pulmonary effort is normal. No respiratory distress.     Breath sounds: No wheezing.  Chest:  Breasts:    Right: No  mass, nipple discharge, skin change or tenderness.     Left: No mass, nipple discharge, skin change or tenderness.  Abdominal:     General: Bowel sounds are normal.     Palpations: Abdomen is soft.     Tenderness: There is no abdominal tenderness.  Musculoskeletal:     Cervical back: Normal range of motion. No erythema.     Right lower leg: No edema.     Left lower leg: No edema.  Lymphadenopathy:     Cervical: No cervical adenopathy.  Skin:    General: Skin is warm and dry.     Findings: No rash.  Neurological:     Mental Status: She is alert and oriented to person, place, and time.     Cranial Nerves: No cranial nerve deficit.     Sensory: No sensory deficit.     Deep Tendon Reflexes: Reflexes are normal and symmetric.  Psychiatric:        Attention and Perception: Attention normal.        Mood and Affect: Mood normal.     Wt Readings from Last 3 Encounters:  11/10/21 164 lb (74.4 kg)  09/19/21 166 lb (75.3 kg)  07/25/21 164 lb (74.4 kg)    There were no vitals taken for this visit.  Assessment and Plan:

## 2022-01-15 DIAGNOSIS — J019 Acute sinusitis, unspecified: Secondary | ICD-10-CM | POA: Diagnosis not present

## 2022-01-15 DIAGNOSIS — J324 Chronic pansinusitis: Secondary | ICD-10-CM | POA: Diagnosis not present

## 2022-01-17 DIAGNOSIS — R69 Illness, unspecified: Secondary | ICD-10-CM | POA: Diagnosis not present

## 2022-01-17 DIAGNOSIS — Z79899 Other long term (current) drug therapy: Secondary | ICD-10-CM | POA: Diagnosis not present

## 2022-01-17 DIAGNOSIS — F411 Generalized anxiety disorder: Secondary | ICD-10-CM | POA: Diagnosis not present

## 2022-01-17 DIAGNOSIS — F410 Panic disorder [episodic paroxysmal anxiety] without agoraphobia: Secondary | ICD-10-CM | POA: Diagnosis not present

## 2022-01-17 DIAGNOSIS — G4700 Insomnia, unspecified: Secondary | ICD-10-CM | POA: Diagnosis not present

## 2022-01-19 ENCOUNTER — Encounter: Payer: Self-pay | Admitting: Internal Medicine

## 2022-01-19 ENCOUNTER — Ambulatory Visit (INDEPENDENT_AMBULATORY_CARE_PROVIDER_SITE_OTHER): Payer: 59 | Admitting: Internal Medicine

## 2022-01-19 VITALS — BP 128/74 | HR 88 | Ht 61.0 in | Wt 164.8 lb

## 2022-01-19 DIAGNOSIS — J320 Chronic maxillary sinusitis: Secondary | ICD-10-CM

## 2022-01-19 DIAGNOSIS — I10 Essential (primary) hypertension: Secondary | ICD-10-CM | POA: Diagnosis not present

## 2022-01-19 DIAGNOSIS — Z23 Encounter for immunization: Secondary | ICD-10-CM

## 2022-01-19 DIAGNOSIS — E118 Type 2 diabetes mellitus with unspecified complications: Secondary | ICD-10-CM | POA: Diagnosis not present

## 2022-01-19 DIAGNOSIS — M62830 Muscle spasm of back: Secondary | ICD-10-CM

## 2022-01-19 LAB — POCT GLYCOSYLATED HEMOGLOBIN (HGB A1C): Hemoglobin A1C: 8.3 % — AB (ref 4.0–5.6)

## 2022-01-19 MED ORDER — CYCLOBENZAPRINE HCL 10 MG PO TABS: ORAL_TABLET | ORAL | 1 refills | Status: DC

## 2022-01-19 NOTE — Patient Instructions (Signed)
Increase insulin to 30 units  Try Claritin or Allegra for post nasal drainage/sore throat symptoms

## 2022-01-19 NOTE — Progress Notes (Signed)
Date:  01/19/2022   Name:  Crystal Rich   DOB:  1965/12/23   MRN:  [id number]   Chief Complaint: Diabetes, Ear Pain (Right ear pain, scratchy throat, nasal congestion, slight cough. ), and Immunizations (FLU SHOT)  Diabetes She presents for her follow-up diabetic visit. She has type 2 diabetes mellitus. Her disease course has been improving. Pertinent negatives for hypoglycemia include no dizziness, headaches or nervousness/anxiousness. Pertinent negatives for diabetes include no chest pain and no fatigue. Symptoms are improving. Current diabetic treatment includes oral agent (monotherapy) and insulin injections (insulin titrated up to 30 units last visit, Victoza). Her breakfast blood glucose is taken between 7-8 am. Her breakfast blood glucose range is generally 130-140 mg/dl. An ACE inhibitor/angiotensin II receptor blocker is being taken.  Sore Throat  This is a new problem. The problem has been unchanged. Neither side of throat is experiencing more pain than the other. There has been no fever. Associated symptoms include congestion, ear pain and a hoarse voice. Pertinent negatives include no headaches or shortness of breath.  Hypertension This is a chronic problem. The problem is controlled. Pertinent negatives include no chest pain, headaches or shortness of breath. Past treatments include angiotensin blockers.    Lab Results  Component Value Date   NA 135 07/14/2021   K 4.3 07/14/2021   CO2 25 07/14/2021   GLUCOSE 233 (H) 07/14/2021   BUN 17 07/14/2021   CREATININE 0.84 07/14/2021   CALCIUM 9.7 07/14/2021   EGFR 83 07/14/2021   GFRNONAA 85 08/17/2019   Lab Results  Component Value Date   CHOL 138 01/05/2021   HDL 38 (L) 01/05/2021   LDLCALC 61 01/05/2021   TRIG 244 (H) 01/05/2021   CHOLHDL 3.6 01/05/2021   Lab Results  Component Value Date   TSH 4.690 (H) 01/05/2021   Lab Results  Component Value Date   HGBA1C 10.5 (A) 09/19/2021   Lab Results  Component  Value Date   WBC 6.9 01/05/2021   HGB 12.1 01/05/2021   HCT 37.2 01/05/2021   MCV 83 01/05/2021   PLT 255 01/05/2021   Lab Results  Component Value Date   ALT 50 (H) 07/14/2021   AST 48 (H) 07/14/2021   ALKPHOS 145 (H) 07/14/2021   BILITOT <0.2 07/14/2021   No results found for: "25OHVITD2", "25OHVITD3", "VD25OH"   Review of Systems  Constitutional:  Negative for chills, fatigue and fever.  HENT:  Positive for congestion, ear pain, hoarse voice, sinus pressure and sore throat.   Eyes:  Negative for visual disturbance.  Respiratory:  Negative for chest tightness and shortness of breath.   Cardiovascular:  Negative for chest pain.  Neurological:  Negative for dizziness, light-headedness and headaches.  Psychiatric/Behavioral:  Negative for dysphoric mood and sleep disturbance. The patient is not nervous/anxious.     Patient Active Problem List   Diagnosis Date Noted   Dyslipidemia 01/09/2022   Status post total hysterectomy and bilateral salpingo-oophorectomy 03/01/2020   Gastroesophageal reflux disease without esophagitis 12/24/2017   History of Clostridium difficile colitis 04/04/2017   Restless legs syndrome 12/31/2016   Essential hypertension 12/31/2016   Lymphadenopathy, axillary 04/25/2015   Type II diabetes mellitus with complication ([place]) 53/66/4403   Insomnia 03/11/2015   Chronic abdominal pain 11/17/2014   Anxiety, generalized 11/17/2014   Depression, major, recurrent, in partial remission ([place]) 11/17/2014   Migraine without aura and responsive to treatment 11/17/2014   Asthma, moderate persistent 11/17/2014    Allergies  Allergen Reactions  Macrobid [Nitrofurantoin] Other (See Comments)    "Patient could not walk"   Nsaids Shortness Of Breath   Doxycycline Itching   Januvia [Sitagliptin] Nausea And Vomiting   Sulfa Antibiotics Rash   Aspirin    Ketorolac     Other reaction(s): Other (See Comments) Patient states Burning sensation inside Other  reaction(s): Other (See Comments) Patient states Burning sensation inside   Morphine Sulfate Nausea And Vomiting   Nalbuphine     Other reaction(s): Other (See Comments) Burning on inside   Prochlorperazine Edisylate Other (See Comments)    tachycardia   Trazodone Cough   Bupropion Rash   Oxybutynin Rash    Past Surgical History:  Procedure Laterality Date   APPENDECTOMY     BILATERAL SALPINGOOPHORECTOMY  05/07/2008   CHOLECYSTECTOMY  05/08/2003   COLONOSCOPY  05/07/2014   TOTAL ABDOMINAL HYSTERECTOMY  1999    Social History   Tobacco Use   Smoking status: Never   Smokeless tobacco: Never  Substance Use Topics   Alcohol use: No    Alcohol/week: 0.0 standard drinks of alcohol   Drug use: No     Medication list has been reviewed and updated.  Current Meds  Medication Sig   albuterol (VENTOLIN HFA) 108 (90 Base) MCG/ACT inhaler Inhale 2 puffs into the lungs every 4 (four) hours as needed for wheezing or shortness of breath.   clonazePAM (KLONOPIN) 1 MG tablet 2 (two) times daily as needed.    COMFORT EZ PEN NEEDLES 31G X 5 MM MISC USE 2 EACH DAILY   cyclobenzaprine (FLEXERIL) 10 MG tablet TAKE 1 TABLET BY MOUTH 3 TIMES DAILY   escitalopram (LEXAPRO) 20 MG tablet Take 40 mg by mouth daily.   fluconazole (DIFLUCAN) 200 MG tablet Take 1 tablet (200 mg total) by mouth daily.   fluticasone (FLONASE) 50 MCG/ACT nasal spray Place 2 sprays into both nostrils daily.   hydrOXYzine (ATARAX/VISTARIL) 25 MG tablet Take 2 tablets by mouth every 8 (eight) hours as needed.    Insulin Glargine (BASAGLAR KWIKPEN) 100 UNIT/ML Inject 30 Units into the skin daily.   isosorbide dinitrate (ISORDIL) 10 MG tablet Take 10 mg by mouth 2 (two) times daily.   liraglutide (VICTOZA) 18 MG/3ML SOPN Inject 0.6 mg into the skin daily at 6 (six) AM for 7 days, THEN 1.2 mg daily at 6 (six) AM for 7 days, THEN 1.8 mg daily at 6 (six) AM. Start 0.40m SQ once a day for 7 days, then increase to 1.241monce a  day.   losartan (COZAAR) 50 MG tablet Take 1 tablet (50 mg total) by mouth daily.   metFORMIN (GLUCOPHAGE) 500 MG tablet TAKE 3 TABLETS BY MOUTH DAILY WITH FOOD   mometasone (ASMANEX, 60 METERED DOSES,) 220 MCG/ACT inhaler Inhale 2 puffs into the lungs daily.   montelukast (SINGULAIR) 10 MG tablet TAKE 1 TABLET BY MOUTH AT BEDTIME   pantoprazole (PROTONIX) 40 MG tablet TAKE 1 TABLET BY MOUTH TWICE DAILY   promethazine (PHENERGAN) 25 MG tablet Take 1 tablet (25 mg total) by mouth 2 (two) times daily as needed.   rizatriptan (MAXALT) 10 MG tablet Take 1 tablet (10 mg total) by mouth as needed for migraine. May repeat in 2 hours if needed   rOPINIRole (REQUIP) 1 MG tablet Take 3 tablets (3 mg total) by mouth at bedtime. (Patient taking differently: Take 3 mg by mouth at bedtime.)   traMADol (ULTRAM) 50 MG tablet Take 50 mg by mouth every 6 (six) hours as needed.    [  DISCONTINUED] sucralfate (CARAFATE) 1 g tablet Take 1 g by mouth 4 (four) times daily.       01/19/2022    8:12 AM 11/10/2021   11:00 AM 09/19/2021   10:55 AM 07/25/2021    1:20 PM  GAD 7 : Generalized Anxiety Score  Nervous, Anxious, on Edge 0 0 0 1  Control/stop worrying 1 0 1 1  Worry too much - different things 1 1 1 1   Trouble relaxing 0 1 0 2  Restless 0 0 1 0  Easily annoyed or irritable 1 1 1 1   Afraid - awful might happen 0 0 1 0  Total GAD 7 Score 3 3 5 6   Anxiety Difficulty Not difficult at all Somewhat difficult Not difficult at all Not difficult at all       01/19/2022    8:12 AM 11/10/2021   11:00 AM 09/19/2021   10:55 AM  Depression screen PHQ 2/9  Decreased Interest 0 1 1  Down, Depressed, Hopeless 1 1 1   PHQ - 2 Score 1 2 2   Altered sleeping 1 1 1   Tired, decreased energy 1 1 1   Change in appetite 1 1 1   Feeling bad or failure about yourself  0 0 0  Trouble concentrating 1 1 0  Moving slowly or fidgety/restless 0 0 0  Suicidal thoughts 0 0 0  PHQ-9 Score 5 6 5   Difficult doing work/chores Somewhat  difficult Somewhat difficult Not difficult at all    BP Readings from Last 3 Encounters:  01/19/22 128/74  11/10/21 122/70  09/19/21 126/74    Physical Exam Vitals and nursing note reviewed.  Constitutional:      General: She is not in acute distress.    Appearance: Normal appearance. She is well-developed.  HENT:     Head: Normocephalic and atraumatic.     Right Ear: No middle ear effusion. Tympanic membrane is retracted. Tympanic membrane is not erythematous.     Left Ear:  No middle ear effusion. Tympanic membrane is retracted. Tympanic membrane is not erythematous.     Mouth/Throat:     Pharynx: Posterior oropharyngeal erythema (mild) present. No oropharyngeal exudate.  Cardiovascular:     Rate and Rhythm: Normal rate and regular rhythm.  Pulmonary:     Effort: Pulmonary effort is normal. No respiratory distress.     Breath sounds: No wheezing or rhonchi.  Musculoskeletal:     Cervical back: Normal range of motion.     Right lower leg: No edema.     Left lower leg: No edema.  Skin:    General: Skin is warm and dry.     Findings: No rash.  Neurological:     Mental Status: She is alert and oriented to person, place, and time.  Psychiatric:        Mood and Affect: Mood normal.        Behavior: Behavior normal.    Diabetic Foot Exam - Simple   Simple Foot Form Diabetic Foot exam was performed with the following findings: Yes 01/19/2022  8:23 AM  Visual Inspection No deformities, no ulcerations, no other skin breakdown bilaterally: Yes Sensation Testing Intact to touch and monofilament testing bilaterally: Yes Pulse Check Posterior Tibialis and Dorsalis pulse intact bilaterally: Yes Comments      Wt Readings from Last 3 Encounters:  01/19/22 164 lb 12.8 oz (74.8 kg)  11/10/21 164 lb (74.4 kg)  09/19/21 166 lb (75.3 kg)    BP 128/74   Pulse 88  Ht 5' 1"  (1.549 m)   Wt 164 lb 12.8 oz (74.8 kg)   SpO2 98%   BMI 31.14 kg/m   Assessment and Plan: 1. Type  II diabetes mellitus with complication (HCC) BS much improved - increase insulin to 30 units Continue Victoza and metformin - POCT glycosylated hemoglobin (Hb A1C) = 8.3 down from > 10  2. Essential hypertension Clinically stable exam with well controlled BP. Tolerating medications without side effects at this time. Pt to continue current regimen and low sodium diet; benefits of regular exercise as able discussed.  3. Chronic maxillary sinusitis Seeing ENT Recent CT showed chronic maxillary changes Continue current medications - flonase, sudafed, singulair but add Claritin or similar  4. Spasm of back muscles - cyclobenzaprine (FLEXERIL) 10 MG tablet; TAKE 1 TABLET BY MOUTH 3 TIMES DAILY  Dispense: 90 tablet; Refill: 1  5. Need for immunization against influenza - Flu Vaccine QUAD 81moIM (Fluarix, Fluzone & Alfiuria Quad PF)   Partially dictated using DEditor, commissioning Any errors are unintentional.  LHalina Maidens MD [facility]  01/19/2022

## 2022-01-29 ENCOUNTER — Other Ambulatory Visit: Payer: Self-pay | Admitting: Internal Medicine

## 2022-01-29 DIAGNOSIS — J4540 Moderate persistent asthma, uncomplicated: Secondary | ICD-10-CM

## 2022-01-30 NOTE — Telephone Encounter (Signed)
Requested Prescriptions  Pending Prescriptions Disp Refills  . montelukast (SINGULAIR) 10 MG tablet [Pharmacy Med Name: MONTELUKAST SODIUM 10 MG TAB] 90 tablet 3    Sig: TAKE 1 TABLET BY MOUTH AT BEDTIME     Pulmonology:  Leukotriene Inhibitors Passed - 01/29/2022  9:12 AM      Passed - Valid encounter within last 12 months    Recent Outpatient Visits          1 week ago Type II diabetes mellitus with complication [facility])   [place] [facility] at [facility], Jesse Sans, MD   2 months ago Syncope and collapse   [place] [facility] at [facility], Jesse Sans, MD   4 months ago Type II diabetes mellitus with complication [facility])   [place] [facility] at [facility], Jesse Sans, MD   6 months ago Type II diabetes mellitus with complication [facility])   [place] [facility] at [facility], Jesse Sans, MD   6 months ago Yeast vaginitis   [place] [facility] at [facility], Jesse Sans, MD      Future Appointments            In 3 months Army Melia, Jesse Sans, MD [place] [facility] at [facility], [facility]

## 2022-01-31 ENCOUNTER — Other Ambulatory Visit: Payer: Self-pay | Admitting: Internal Medicine

## 2022-01-31 DIAGNOSIS — E118 Type 2 diabetes mellitus with unspecified complications: Secondary | ICD-10-CM

## 2022-01-31 NOTE — Telephone Encounter (Signed)
Requested Prescriptions  Pending Prescriptions Disp Refills  . [place] 18 MG/3ML SOPN [Pharmacy Med Name: VICTOZA 18 MG/3ML SUBQ SOLN ML] 9 mL 3    Sig: INJECT 0.6mg  INTO THE SKIN DAILY AT 6am FOR 7 DAYS, THEN 1.2mg  DAILY AT 6aM FOR 7 DAYS, THEN 1.8mg  DAILY AT 6am THEREAFTER     Endocrinology:  Diabetes - GLP-1 Receptor Agonists Failed - 01/31/2022 12:08 PM      Failed - HBA1C is between 0 and 7.9 and within 180 days    Hemoglobin A1C  Date Value Ref Range Status  01/19/2022 8.3 (A) 4.0 - 5.6 % Final   Hgb A1c MFr Bld  Date Value Ref Range Status  07/14/2021 11.5 (H) 4.8 - 5.6 % Final    Comment:             Prediabetes: 5.7 - 6.4          Diabetes: >6.4          Glycemic control for adults with diabetes: <7.0          Passed - Valid encounter within last 6 months    Recent Outpatient Visits          1 week ago Type II diabetes mellitus with complication ([place])   [place] [facility] at [facility], Jesse Sans, MD   2 months ago Syncope and collapse   [place] [facility] at [facility], Jesse Sans, MD   4 months ago Type II diabetes mellitus with complication [facility])   [place] [facility] at [facility], Jesse Sans, MD   6 months ago Type II diabetes mellitus with complication [facility])   [place] [facility] at [facility] Jesse Sans, MD   6 months ago Yeast vaginitis   [place] [facility] at [facility], Jesse Sans, MD      Future Appointments            In 3 months Army Melia, Jesse Sans, MD [place] [facility] at [facility], [facility]

## 2022-02-09 DIAGNOSIS — H903 Sensorineural hearing loss, bilateral: Secondary | ICD-10-CM | POA: Diagnosis not present

## 2022-02-09 DIAGNOSIS — J310 Chronic rhinitis: Secondary | ICD-10-CM | POA: Diagnosis not present

## 2022-02-19 DIAGNOSIS — M1711 Unilateral primary osteoarthritis, right knee: Secondary | ICD-10-CM | POA: Diagnosis not present

## 2022-02-19 DIAGNOSIS — R768 Other specified abnormal immunological findings in serum: Secondary | ICD-10-CM | POA: Diagnosis not present

## 2022-02-19 DIAGNOSIS — E069 Thyroiditis, unspecified: Secondary | ICD-10-CM | POA: Diagnosis not present

## 2022-02-19 DIAGNOSIS — E119 Type 2 diabetes mellitus without complications: Secondary | ICD-10-CM | POA: Diagnosis not present

## 2022-02-19 DIAGNOSIS — K909 Intestinal malabsorption, unspecified: Secondary | ICD-10-CM | POA: Diagnosis not present

## 2022-02-19 DIAGNOSIS — E611 Iron deficiency: Secondary | ICD-10-CM | POA: Diagnosis not present

## 2022-02-19 DIAGNOSIS — R5383 Other fatigue: Secondary | ICD-10-CM | POA: Diagnosis not present

## 2022-02-20 ENCOUNTER — Other Ambulatory Visit: Payer: Self-pay | Admitting: Internal Medicine

## 2022-02-20 DIAGNOSIS — I10 Essential (primary) hypertension: Secondary | ICD-10-CM

## 2022-02-20 NOTE — Telephone Encounter (Signed)
Requested Prescriptions  Pending Prescriptions Disp Refills  . losartan (COZAAR) 50 MG tablet [Pharmacy Med Name: LOSARTAN POTASSIUM 50 MG TAB] 90 tablet 1    Sig: TAKE 1 TABLET BY MOUTH DAILY     Cardiovascular:  Angiotensin Receptor Blockers Failed - 02/20/2022  9:33 AM      Failed - Cr in normal range and within 180 days    Creatinine, Ser  Date Value Ref Range Status  07/14/2021 0.84 0.57 - 1.00 mg/dL Final         Failed - K in normal range and within 180 days    Potassium  Date Value Ref Range Status  07/14/2021 4.3 3.5 - 5.2 mmol/L Final         Passed - Patient is not pregnant      Passed - Last BP in normal range    BP Readings from Last 1 Encounters:  01/19/22 128/74         Passed - Valid encounter within last 6 months    Recent Outpatient Visits          1 month ago Type II diabetes mellitus with complication ([place])   [place] [facility] at [facility], Jesse Sans, MD   3 months ago Syncope and collapse   [place] [facility] Care and Sports Medicine at [facility], Jesse Sans, MD   5 months ago Type II diabetes mellitus with complication [facility])   [place] Primary Care and Sports Medicine at [facility], Jesse Sans, MD   7 months ago Type II diabetes mellitus with complication [facility])    Primary Care and Sports Medicine at [facility], Jesse Sans, MD   7 months ago Yeast vaginitis    Primary Care and Sports Medicine at [facility], Jesse Sans, MD      Future Appointments            In 3 months Army Melia, Jesse Sans, MD [facility] Primary Care and Sports Medicine at [facility], [facility]

## 2022-02-22 ENCOUNTER — Other Ambulatory Visit: Payer: Self-pay | Admitting: Internal Medicine

## 2022-02-22 DIAGNOSIS — G2581 Restless legs syndrome: Secondary | ICD-10-CM

## 2022-02-22 NOTE — Telephone Encounter (Signed)
Requested Prescriptions  Pending Prescriptions Disp Refills  . rOPINIRole (REQUIP) 1 MG tablet [Pharmacy Med Name: ROPINIROLE HCL 1 MG TAB] 360 tablet 0    Sig: TAKE 3 TABLETS BY MOUTH AT BEDTIME     Neurology:  Parkinsonian Agents Passed - 02/22/2022  2:34 PM      Passed - Last BP in normal range    BP Readings from Last 1 Encounters:  01/19/22 128/74         Passed - Last Heart Rate in normal range    Pulse Readings from Last 1 Encounters:  01/19/22 88         Passed - Valid encounter within last 12 months    Recent Outpatient Visits          1 month ago Type II diabetes mellitus with complication ([place])   [place] [facility] and [facility] Medicine at [facility], Jesse Sans, MD   3 months ago Syncope and collapse   [place] Primary Care and Sports Medicine at [facility] Jesse Sans, MD   5 months ago Type II diabetes mellitus with complication [facility])   [place] Primary Care and Sports Medicine at [facility], Jesse Sans, MD   7 months ago Type II diabetes mellitus with complication [facility])   [place] Primary Care and Sports Medicine at [facility], Jesse Sans, MD   7 months ago Yeast vaginitis   [place] Primary Care and Sports Medicine at [facility], Jesse Sans, MD      Future Appointments            In 3 months Army Melia, Jesse Sans, MD [place] and Sports Medicine at [facility], [facility]

## 2022-03-09 DIAGNOSIS — J019 Acute sinusitis, unspecified: Secondary | ICD-10-CM | POA: Diagnosis not present

## 2022-03-09 DIAGNOSIS — J209 Acute bronchitis, unspecified: Secondary | ICD-10-CM | POA: Diagnosis not present

## 2022-03-16 DIAGNOSIS — R300 Dysuria: Secondary | ICD-10-CM | POA: Diagnosis not present

## 2022-03-19 ENCOUNTER — Other Ambulatory Visit: Payer: Self-pay | Admitting: Internal Medicine

## 2022-03-20 NOTE — Telephone Encounter (Signed)
Requested medications are due for refill today.  yes  Requested medications are on the active medications list.  yes  Last refill. 09/13/2021 #270 1rf  Future visit scheduled.   yes  Notes to clinic.  Missing and expired labs.    Requested Prescriptions  Pending Prescriptions Disp Refills   metFORMIN (GLUCOPHAGE) 500 MG tablet [Pharmacy Med Name: METFORMIN HCL 500 MG TAB] 270 tablet 1    Sig: TAKE 3 TABLETS BY MOUTH DAILY WITH FOOD     Endocrinology:  Diabetes - Biguanides Failed - 03/19/2022  9:08 AM      Failed - HBA1C is between 0 and 7.9 and within 180 days    Hemoglobin A1C  Date Value Ref Range Status  01/19/2022 8.3 (A) 4.0 - 5.6 % Final   Hgb A1c MFr Bld  Date Value Ref Range Status  07/14/2021 11.5 (H) 4.8 - 5.6 % Final    Comment:             Prediabetes: 5.7 - 6.4          Diabetes: >6.4          Glycemic control for adults with diabetes: <7.0          Failed - B12 Level in normal range and within 720 days    No results found for: "VITAMINB12"       Failed - CBC within normal limits and completed in the last 12 months    WBC  Date Value Ref Range Status  01/05/2021 6.9 3.4 - 10.8 x10E3/uL Final   RBC  Date Value Ref Range Status  01/05/2021 4.49 3.77 - 5.28 x10E6/uL Final   Hemoglobin  Date Value Ref Range Status  01/05/2021 12.1 11.1 - 15.9 g/dL Final   Hematocrit  Date Value Ref Range Status  01/05/2021 37.2 34.0 - 46.6 % Final   MCHC  Date Value Ref Range Status  01/05/2021 32.5 31.5 - 35.7 g/dL Final   [facility]  Date Value Ref Range Status  01/05/2021 26.9 26.6 - 33.0 pg Final   MCV  Date Value Ref Range Status  01/05/2021 83 79 - 97 fL Final   No results found for: "PLTCOUNTKUC", "LABPLAT", "POCPLA" RDW  Date Value Ref Range Status  01/05/2021 12.4 11.7 - 15.4 % Final         Passed - Cr in normal range and within 360 days    Creatinine, Ser  Date Value Ref Range Status  07/14/2021 0.84 0.57 - 1.00 mg/dL Final         Passed -  eGFR in normal range and within 360 days    GFR calc Af Amer  Date Value Ref Range Status  08/17/2019 98 >59 mL/min/1.73 Final   GFR calc non Af Amer  Date Value Ref Range Status  08/17/2019 85 >59 mL/min/1.73 Final   eGFR  Date Value Ref Range Status  07/14/2021 83 >59 mL/min/1.73 Final         Passed - Valid encounter within last 6 months    Recent Outpatient Visits           2 months ago Type II diabetes mellitus with complication [facility])   [place] Primary Care and Sports Medicine at [facility], Jesse Sans, MD   4 months ago Syncope and collapse   [place] Primary Care and Sports Medicine at [facility], Jesse Sans, MD   6 months ago Type II diabetes mellitus with complication [facility])   [place]  Primary Care and Sports Medicine at [facility], Jesse Sans, MD   7 months ago Type II diabetes mellitus with complication [facility])   [place] Primary Care and Sports Medicine at [facility], Jesse Sans, MD   8 months ago Yeast vaginitis   [place] Primary Care and Sports Medicine at [facility], Jesse Sans, MD       Future Appointments             In 2 months Army Melia, Jesse Sans, MD [facility] Primary Care and Sports Medicine at [facility], [facility]

## 2022-03-26 DIAGNOSIS — F411 Generalized anxiety disorder: Secondary | ICD-10-CM | POA: Diagnosis not present

## 2022-03-26 DIAGNOSIS — E119 Type 2 diabetes mellitus without complications: Secondary | ICD-10-CM | POA: Diagnosis not present

## 2022-03-26 DIAGNOSIS — F410 Panic disorder [episodic paroxysmal anxiety] without agoraphobia: Secondary | ICD-10-CM | POA: Diagnosis not present

## 2022-03-26 DIAGNOSIS — Z79899 Other long term (current) drug therapy: Secondary | ICD-10-CM | POA: Diagnosis not present

## 2022-03-26 DIAGNOSIS — R69 Illness, unspecified: Secondary | ICD-10-CM | POA: Diagnosis not present

## 2022-03-26 DIAGNOSIS — G4700 Insomnia, unspecified: Secondary | ICD-10-CM | POA: Diagnosis not present

## 2022-03-26 DIAGNOSIS — M1711 Unilateral primary osteoarthritis, right knee: Secondary | ICD-10-CM | POA: Diagnosis not present

## 2022-04-05 DIAGNOSIS — M1711 Unilateral primary osteoarthritis, right knee: Secondary | ICD-10-CM | POA: Diagnosis not present

## 2022-04-16 DIAGNOSIS — M1711 Unilateral primary osteoarthritis, right knee: Secondary | ICD-10-CM | POA: Diagnosis not present

## 2022-04-16 DIAGNOSIS — E119 Type 2 diabetes mellitus without complications: Secondary | ICD-10-CM | POA: Diagnosis not present

## 2022-04-16 DIAGNOSIS — J4540 Moderate persistent asthma, uncomplicated: Secondary | ICD-10-CM | POA: Diagnosis not present

## 2022-04-23 ENCOUNTER — Other Ambulatory Visit: Payer: Self-pay | Admitting: Internal Medicine

## 2022-04-23 DIAGNOSIS — M62830 Muscle spasm of back: Secondary | ICD-10-CM

## 2022-04-23 NOTE — Telephone Encounter (Signed)
Requested medication (s) are due for refill today - yes    Requested medication (s) are on the active medication list -yes   Future visit scheduled -yes   Last refill: 01/19/22 #90 1RF   Notes to clinic: rx was sent for 30 DS, rx is for TID. Pharmacy requesting again for refill.  non delegated Rx         Requested Prescriptions  Pending Prescriptions Disp Refills   cyclobenzaprine (FLEXERIL) 10 MG tablet [Pharmacy Med Name: CYCLOBENZAPRINE HCL 10 MG TAB] 90 tablet 1    Sig: TAKE 1 TABLET BY MOUTH 3 TIMES DAILY     Not Delegated - Analgesics:  Muscle Relaxants Failed - 04/23/2022  1:56 PM      Failed - This refill cannot be delegated      Passed - Valid encounter within last 6 months    Recent Outpatient Visits           3 months ago Type II diabetes mellitus with complication (HCC)   [place] Primary Care and Sports Medicine at [facility], [name], MD   5 months ago Syncope and collapse   [place] Primary Care and Sports Medicine at [facility], [name], MD   7 months ago Type II diabetes mellitus with complication [facility])   [place] Primary Care and Sports Medicine at [facility], [name], MD   9 months ago Type II diabetes mellitus with complication [facility])   [place] Primary Care and Sports Medicine at [facility], [name], MD   9 months ago Yeast vaginitis   [place] Primary Care and Sports Medicine at [facility] [name], MD       Future Appointments             In 1 month [name], [name], MD [facility] Primary Care and Sports Medicine at [facility], [facility]

## 2022-04-23 NOTE — Telephone Encounter (Signed)
Requested medication (s) are due for refill today - yes  Requested medication (s) are on the active medication list -yes  Future visit scheduled -yes  Last refill: 01/19/22 #90 1RF  Notes to clinic: non delegated Rx  Requested Prescriptions  Pending Prescriptions Disp Refills   cyclobenzaprine (FLEXERIL) 10 MG tablet [Pharmacy Med Name: CYCLOBENZAPRINE HCL 10 MG TAB] 90 tablet 1    Sig: TAKE 1 TABLET BY MOUTH 3 TIMES DAILY     Not Delegated - Analgesics:  Muscle Relaxants Failed - 04/23/2022 10:20 AM      Failed - This refill cannot be delegated      Passed - Valid encounter within last 6 months    Recent Outpatient Visits           3 months ago Type II diabetes mellitus with complication (HCC)   [place] Primary Care and Sports Medicine at [facility], [name], MD   5 months ago Syncope and collapse   [place] Primary Care and Sports Medicine at [facility], [name], MD   7 months ago Type II diabetes mellitus with complication [facility])   [place] Primary Care and Sports Medicine at [facility], [name], MD   9 months ago Type II diabetes mellitus with complication [facility])   [place] Primary Care and Sports Medicine at [facility], [name], MD   9 months ago Yeast vaginitis   [place] Primary Care and Sports Medicine at [facility], [name], MD       Future Appointments             In 1 month [name], [name], MD [facility] Primary Care and Sports Medicine at [facility], [facility]               Requested Prescriptions  Pending Prescriptions Disp Refills   cyclobenzaprine (FLEXERIL) 10 MG tablet [Pharmacy Med Name: CYCLOBENZAPRINE HCL 10 MG TAB] 90 tablet 1    Sig: TAKE 1 TABLET BY MOUTH 3 TIMES DAILY     Not Delegated - Analgesics:  Muscle Relaxants Failed - 04/23/2022 10:20 AM      Failed - This refill cannot be delegated      Passed - Valid encounter within last 6 months     Recent Outpatient Visits           3 months ago Type II diabetes mellitus with complication (HCC)   [place] [facility] Care and Sports Medicine at [facility], [name], MD   5 months ago Syncope and collapse   [place] Primary Care and Sports Medicine at [facility], [name], MD   7 months ago Type II diabetes mellitus with complication [facility])   [place] Primary Care and Sports Medicine at [facility], [name], MD   9 months ago Type II diabetes mellitus with complication [facility])   [place] Primary Care and Sports Medicine at [facility], [name], MD   9 months ago Yeast vaginitis   [place] Primary Care and Sports Medicine at [facility], [name], MD       Future Appointments             In 1 month [name], [name], MD [facility] Primary Care and Sports Medicine at [facility], [facility]

## 2022-04-27 DIAGNOSIS — K909 Intestinal malabsorption, unspecified: Secondary | ICD-10-CM | POA: Diagnosis not present

## 2022-05-01 ENCOUNTER — Telehealth: Payer: Self-pay

## 2022-05-01 NOTE — Telephone Encounter (Unsigned)
PA completed waiting on insurance approval.  Key: [id number]  KP

## 2022-05-03 NOTE — Telephone Encounter (Signed)
Approved   05/02/2022-05/03/2023  Called pt left VM with approval.  KP

## 2022-05-04 DIAGNOSIS — M1711 Unilateral primary osteoarthritis, right knee: Secondary | ICD-10-CM | POA: Diagnosis not present

## 2022-05-08 DIAGNOSIS — M1711 Unilateral primary osteoarthritis, right knee: Secondary | ICD-10-CM | POA: Diagnosis not present

## 2022-05-14 ENCOUNTER — Telehealth: Payer: Self-pay | Admitting: Internal Medicine

## 2022-05-14 NOTE — Telephone Encounter (Signed)
Copied from [place] [phone]. Topic: General - Other >> May 14, 2022 12:14 PM [place] B wrote: Reason for CRM: Patient called in states she is having knee surgery, and the her blood sugar is 8.8 and the surgeon wants to get it down to 7.5, so she is asking for any med to help get it down. Please ball back

## 2022-05-14 NOTE — Telephone Encounter (Signed)
Copied from CRM #[id number]. Topic: General - Other >> May 14, 2022 12:14 PM Crystal Rich wrote: Reason for CRM: Patient called in states she is having knee surgery, and the her blood sugar is 8.8 and the surgeon wants to get it down to 7.5, so she is asking for any med to help get it down. Please ball back 

## 2022-05-14 NOTE — Telephone Encounter (Signed)
Called pt told her that she has a appointment 1/18 that we will discuss DM at that appointment. Pt verbalized understanding.  KP

## 2022-05-14 NOTE — Telephone Encounter (Signed)
Copied from CRM #[id number]. Topic: General - Other >> May 14, 2022 12:14 PM [place] B wrote: Reason for CRM: Patient called in states she is having knee surgery, and the her blood sugar is 8.8 and the surgeon wants to get it down to 7.5, so she is asking for any med to help get it down. Please ball back 

## 2022-05-14 NOTE — Telephone Encounter (Signed)
Duplicate   KP 

## 2022-05-16 DIAGNOSIS — R2681 Unsteadiness on feet: Secondary | ICD-10-CM | POA: Diagnosis not present

## 2022-05-16 DIAGNOSIS — M25561 Pain in right knee: Secondary | ICD-10-CM | POA: Diagnosis not present

## 2022-05-16 DIAGNOSIS — M1711 Unilateral primary osteoarthritis, right knee: Secondary | ICD-10-CM | POA: Diagnosis not present

## 2022-05-18 DIAGNOSIS — Z79899 Other long term (current) drug therapy: Secondary | ICD-10-CM | POA: Diagnosis not present

## 2022-05-18 DIAGNOSIS — G4700 Insomnia, unspecified: Secondary | ICD-10-CM | POA: Diagnosis not present

## 2022-05-18 DIAGNOSIS — F411 Generalized anxiety disorder: Secondary | ICD-10-CM | POA: Diagnosis not present

## 2022-05-18 DIAGNOSIS — R69 Illness, unspecified: Secondary | ICD-10-CM | POA: Diagnosis not present

## 2022-05-23 ENCOUNTER — Other Ambulatory Visit: Payer: Self-pay | Admitting: Internal Medicine

## 2022-05-23 DIAGNOSIS — M25561 Pain in right knee: Secondary | ICD-10-CM | POA: Diagnosis not present

## 2022-05-23 DIAGNOSIS — K219 Gastro-esophageal reflux disease without esophagitis: Secondary | ICD-10-CM

## 2022-05-23 DIAGNOSIS — M1711 Unilateral primary osteoarthritis, right knee: Secondary | ICD-10-CM | POA: Diagnosis not present

## 2022-05-23 DIAGNOSIS — R2681 Unsteadiness on feet: Secondary | ICD-10-CM | POA: Diagnosis not present

## 2022-05-24 ENCOUNTER — Encounter: Payer: 59 | Admitting: Internal Medicine

## 2022-05-31 ENCOUNTER — Other Ambulatory Visit: Payer: Self-pay | Admitting: Internal Medicine

## 2022-05-31 DIAGNOSIS — G2581 Restless legs syndrome: Secondary | ICD-10-CM

## 2022-05-31 DIAGNOSIS — E118 Type 2 diabetes mellitus with unspecified complications: Secondary | ICD-10-CM

## 2022-05-31 NOTE — Telephone Encounter (Signed)
Please call pt to schedule an appt.  KP

## 2022-06-08 DIAGNOSIS — M1711 Unilateral primary osteoarthritis, right knee: Secondary | ICD-10-CM | POA: Diagnosis not present

## 2022-06-22 DIAGNOSIS — E611 Iron deficiency: Secondary | ICD-10-CM | POA: Diagnosis not present

## 2022-06-22 DIAGNOSIS — E069 Thyroiditis, unspecified: Secondary | ICD-10-CM | POA: Diagnosis not present

## 2022-06-22 DIAGNOSIS — K909 Intestinal malabsorption, unspecified: Secondary | ICD-10-CM | POA: Diagnosis not present

## 2022-06-22 DIAGNOSIS — E559 Vitamin D deficiency, unspecified: Secondary | ICD-10-CM | POA: Diagnosis not present

## 2022-06-22 DIAGNOSIS — R768 Other specified abnormal immunological findings in serum: Secondary | ICD-10-CM | POA: Diagnosis not present

## 2022-06-22 DIAGNOSIS — R300 Dysuria: Secondary | ICD-10-CM | POA: Diagnosis not present

## 2022-07-04 ENCOUNTER — Other Ambulatory Visit: Payer: Self-pay | Admitting: Internal Medicine

## 2022-07-04 DIAGNOSIS — E118 Type 2 diabetes mellitus with unspecified complications: Secondary | ICD-10-CM

## 2022-07-04 MED ORDER — VICTOZA 18 MG/3ML ~~LOC~~ SOPN: PEN_INJECTOR | SUBCUTANEOUS | 0 refills | Status: DC

## 2022-07-04 NOTE — Telephone Encounter (Signed)
Medication Refill - Medication: liraglutide ([place]) 18 MG/3ML SOPN [id number]   Has the patient contacted their pharmacy? Yes.    (Agent: If yes, when and what did the pharmacy advise?) Contact provider   Preferred Pharmacy (with phone number or street name):  [place], [place]    Has the patient been seen for an appointment in the last year OR does the patient have an upcoming appointment? Yes.    Agent: Please be advised that RX refills may take up to 3 business days. We ask that you follow-up with your pharmacy.   Patient has a small amount left in syringe.

## 2022-07-09 ENCOUNTER — Telehealth: Payer: Self-pay | Admitting: Internal Medicine

## 2022-07-09 ENCOUNTER — Ambulatory Visit: Payer: 59 | Admitting: Internal Medicine

## 2022-07-09 NOTE — Telephone Encounter (Signed)
Copied from [place] [phone]. Topic: Appointment Scheduling - Scheduling Inquiry for Clinic >> Jul 09, 2022  8:09 AM Crystal Rich wrote: Reason for CRM: pt called and cancelled for this morning saying she is sick.  She wanted to resch but there was not anything until April.  She said could she come in and get a A1C when she feels better  [phone]

## 2022-07-09 NOTE — Assessment & Plan Note (Deleted)
Clinically stable exam with well controlled BP on losartan. Tolerating medications without side effects. Pt to continue current regimen and low sodium diet.

## 2022-07-09 NOTE — Assessment & Plan Note (Deleted)
LDL is  Lab Results  Component Value Date   LDLCALC 61 01/05/2021  On no medications. Should be on statins due to DM

## 2022-07-09 NOTE — Assessment & Plan Note (Deleted)
Clinically stable without s/s of hypoglycemia. Tolerating metformin, Victoza and insulin 30 units well without side effects or other concerns. A1C was improved last visit - med were continued. Lab Results  Component Value Date   HGBA1C 8.3 (A) 01/19/2022

## 2022-07-09 NOTE — Progress Notes (Deleted)
Date:  07/09/2022   Name:  Crystal Rich   DOB:  April 06, 1966   MRN:  [id number]   Chief Complaint: No chief complaint on file.  Diabetes She presents for her follow-up diabetic visit. She has type 2 diabetes mellitus. Her disease course has been improving. Pertinent negatives for hypoglycemia include no headaches or tremors. Pertinent negatives for diabetes include no chest pain, no fatigue, no polydipsia and no polyuria. Current diabetic treatment includes oral agent (monotherapy) (plus insulin and Victoza).  Hypertension This is a chronic problem. The problem is controlled. Pertinent negatives include no chest pain, headaches, palpitations or shortness of breath. Past treatments include angiotensin blockers.  Hyperlipidemia This is a chronic problem. Recent lipid tests were reviewed and are normal. Exacerbating diseases include diabetes. Pertinent negatives include no chest pain or shortness of breath. She is currently on no antihyperlipidemic treatment.    Lab Results  Component Value Date   NA 135 07/14/2021   K 4.3 07/14/2021   CO2 25 07/14/2021   GLUCOSE 233 (H) 07/14/2021   BUN 17 07/14/2021   CREATININE 0.84 07/14/2021   CALCIUM 9.7 07/14/2021   EGFR 83 07/14/2021   GFRNONAA 85 08/17/2019   Lab Results  Component Value Date   CHOL 138 01/05/2021   HDL 38 (L) 01/05/2021   LDLCALC 61 01/05/2021   TRIG 244 (H) 01/05/2021   CHOLHDL 3.6 01/05/2021   Lab Results  Component Value Date   TSH 4.690 (H) 01/05/2021   Lab Results  Component Value Date   HGBA1C 8.3 (A) 01/19/2022   Lab Results  Component Value Date   WBC 6.9 01/05/2021   HGB 12.1 01/05/2021   HCT 37.2 01/05/2021   MCV 83 01/05/2021   PLT 255 01/05/2021   Lab Results  Component Value Date   ALT 50 (H) 07/14/2021   AST 48 (H) 07/14/2021   ALKPHOS 145 (H) 07/14/2021   BILITOT <0.2 07/14/2021   No results found for: "25OHVITD2", "25OHVITD3", "VD25OH"   Review of Systems  Constitutional:   Negative for appetite change, fatigue, fever and unexpected weight change.  HENT:  Negative for tinnitus and trouble swallowing.   Eyes:  Negative for visual disturbance.  Respiratory:  Negative for cough, chest tightness and shortness of breath.   Cardiovascular:  Negative for chest pain, palpitations and leg swelling.  Gastrointestinal:  Negative for abdominal pain.  Endocrine: Negative for polydipsia and polyuria.  Genitourinary:  Negative for dysuria and hematuria.  Musculoskeletal:  Negative for arthralgias.  Neurological:  Negative for tremors, numbness and headaches.  Psychiatric/Behavioral:  Negative for dysphoric mood.     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   Migraine without aura and responsive to treatment 11/17/2014   Asthma, moderate persistent 11/17/2014    Allergies  Allergen Reactions   Macrobid [Nitrofurantoin] Other (See Comments)    "Patient could not walk"   Nsaids Shortness Of Breath   Doxycycline Itching   Januvia [Sitagliptin] Nausea And Vomiting   Sulfa Antibiotics Rash  Aspirin    Ketorolac     Other reaction(s): Other (See Comments) Patient states Burning sensation inside Other reaction(s): Other (See Comments) Patient states Burning sensation inside   Morphine Sulfate Nausea And Vomiting   Nalbuphine     Other reaction(s): Other (See Comments) Burning on inside    Prochlorperazine Edisylate Other (See Comments)    tachycardia   Trazodone Cough   Bupropion Rash   Oxybutynin Rash    Past Surgical History:  Procedure Laterality Date   APPENDECTOMY     BILATERAL SALPINGOOPHORECTOMY  05/07/2008   CHOLECYSTECTOMY  05/08/2003   COLONOSCOPY  05/07/2014   TOTAL ABDOMINAL HYSTERECTOMY  1999    Social History   Tobacco Use   Smoking status: Never   Smokeless tobacco: Never  Substance Use Topics   Alcohol use: No    Alcohol/week: 0.0 standard drinks of alcohol   Drug use: No     Medication list has been reviewed and updated.  No outpatient medications have been marked as taking for the 07/09/22 encounter (Appointment) with Glean Hess, MD.       01/19/2022    8:12 AM 11/10/2021   11:00 AM 09/19/2021   10:55 AM 07/25/2021    1:20 PM  GAD 7 : Generalized Anxiety Score  Nervous, Anxious, on Edge 0 0 0 1  Control/stop worrying 1 0 1 1  Worry too much - different things '1 1 1 1  '$ Trouble relaxing 0 1 0 2  Restless 0 0 1 0  Easily annoyed or irritable '1 1 1 1  '$ Afraid - awful might happen 0 0 1 0  Total GAD 7 Score '3 3 5 6  '$ Anxiety Difficulty Not difficult at all Somewhat difficult Not difficult at all Not difficult at all       01/19/2022    8:12 AM 11/10/2021   11:00 AM 09/19/2021   10:55 AM  Depression screen PHQ 2/9  Decreased Interest 0 1 1  Down, Depressed, Hopeless '1 1 1  '$ PHQ - 2 Score '1 2 2  '$ Altered sleeping '1 1 1  '$ Tired, decreased energy '1 1 1  '$ Change in appetite '1 1 1  '$ Feeling bad or failure about yourself  0 0 0  Trouble concentrating 1 1 0  Moving slowly or fidgety/restless 0 0 0  Suicidal thoughts 0 0 0  PHQ-9 Score '5 6 5  '$ Difficult doing work/chores Somewhat difficult Somewhat difficult Not difficult at all    BP Readings from Last 3 Encounters:  01/19/22 128/74  11/10/21 122/70  09/19/21 126/74    Physical Exam Vitals and nursing note reviewed.  Constitutional:      General: She is not in acute distress.     Appearance: She is well-developed.  HENT:     Head: Normocephalic and atraumatic.  Pulmonary:     Effort: Pulmonary effort is normal. No respiratory distress.  Skin:    General: Skin is warm and dry.     Findings: No rash.  Neurological:     Mental Status: She is alert and oriented to person, place, and time.  Psychiatric:        Mood and Affect: Mood normal.        Behavior: Behavior normal.     Wt Readings from Last 3 Encounters:  01/19/22 164 lb 12.8 oz (74.8 kg)  11/10/21 164 lb (74.4 kg)  09/19/21 166 lb (75.3 kg)    There were no vitals taken for this visit.  Assessment and  Plan:

## 2022-07-16 DIAGNOSIS — G8929 Other chronic pain: Secondary | ICD-10-CM | POA: Diagnosis not present

## 2022-07-16 DIAGNOSIS — Z79899 Other long term (current) drug therapy: Secondary | ICD-10-CM | POA: Diagnosis not present

## 2022-07-16 DIAGNOSIS — F411 Generalized anxiety disorder: Secondary | ICD-10-CM | POA: Diagnosis not present

## 2022-07-16 DIAGNOSIS — F332 Major depressive disorder, recurrent severe without psychotic features: Secondary | ICD-10-CM | POA: Diagnosis not present

## 2022-07-17 ENCOUNTER — Other Ambulatory Visit: Payer: Self-pay | Admitting: Internal Medicine

## 2022-07-17 DIAGNOSIS — E118 Type 2 diabetes mellitus with unspecified complications: Secondary | ICD-10-CM

## 2022-07-20 ENCOUNTER — Ambulatory Visit (INDEPENDENT_AMBULATORY_CARE_PROVIDER_SITE_OTHER): Payer: 59 | Admitting: Internal Medicine

## 2022-07-20 ENCOUNTER — Encounter: Payer: Self-pay | Admitting: Internal Medicine

## 2022-07-20 VITALS — BP 120/70 | HR 108 | Ht 61.0 in | Wt 154.0 lb

## 2022-07-20 DIAGNOSIS — E785 Hyperlipidemia, unspecified: Secondary | ICD-10-CM

## 2022-07-20 DIAGNOSIS — I10 Essential (primary) hypertension: Secondary | ICD-10-CM

## 2022-07-20 DIAGNOSIS — E118 Type 2 diabetes mellitus with unspecified complications: Secondary | ICD-10-CM

## 2022-07-20 DIAGNOSIS — E1169 Type 2 diabetes mellitus with other specified complication: Secondary | ICD-10-CM | POA: Diagnosis not present

## 2022-07-20 LAB — POCT GLYCOSYLATED HEMOGLOBIN (HGB A1C): Hemoglobin A1C: 9.7 % — AB (ref 4.0–5.6)

## 2022-07-20 MED ORDER — BASAGLAR KWIKPEN 100 UNIT/ML ~~LOC~~ SOPN: PEN_INJECTOR | SUBCUTANEOUS | 12 refills | Status: AC

## 2022-07-20 NOTE — Progress Notes (Signed)
Date:  07/20/2022   Name:  Crystal Rich   DOB:  04/21/1966   MRN:  [id number]   Chief Complaint: Diabetes, Hypertension, and Hyperlipidemia  Diabetes She presents for her follow-up diabetic visit. She has type 2 diabetes mellitus. There are no hypoglycemic associated symptoms. Pertinent negatives for hypoglycemia include no headaches, nervousness/anxiousness or tremors. Pertinent negatives for diabetes include no chest pain, no fatigue, no polydipsia and no polyuria. Current diabetic treatment includes insulin injections (metformin and Victoza). She is following a generally healthy diet. She never participates in exercise.  Hypertension This is a chronic problem. The problem is controlled. Pertinent negatives include no chest pain, headaches, palpitations or shortness of breath. Past treatments include angiotensin blockers.  Hyperlipidemia The problem is controlled. Recent lipid tests were reviewed and are normal. Pertinent negatives include no chest pain or shortness of breath. She is currently on no antihyperlipidemic treatment.    Lab Results  Component Value Date   NA 135 07/14/2021   K 4.3 07/14/2021   CO2 25 07/14/2021   GLUCOSE 233 (H) 07/14/2021   BUN 17 07/14/2021   CREATININE 0.84 07/14/2021   CALCIUM 9.7 07/14/2021   EGFR 83 07/14/2021   GFRNONAA 85 08/17/2019   Lab Results  Component Value Date   CHOL 138 01/05/2021   HDL 38 (L) 01/05/2021   LDLCALC 61 01/05/2021   TRIG 244 (H) 01/05/2021   CHOLHDL 3.6 01/05/2021   Lab Results  Component Value Date   TSH 4.690 (H) 01/05/2021   Lab Results  Component Value Date   HGBA1C 9.7 (A) 07/20/2022   Lab Results  Component Value Date   WBC 6.9 01/05/2021   HGB 12.1 01/05/2021   HCT 37.2 01/05/2021   MCV 83 01/05/2021   PLT 255 01/05/2021   Lab Results  Component Value Date   ALT 50 (H) 07/14/2021   AST 48 (H) 07/14/2021   ALKPHOS 145 (H) 07/14/2021   BILITOT <0.2 07/14/2021   No results found for:  "25OHVITD2", "25OHVITD3", "VD25OH"   Review of Systems  Constitutional:  Negative for appetite change, fatigue, fever and unexpected weight change.  HENT:  Negative for tinnitus and trouble swallowing.   Eyes:  Negative for visual disturbance.  Respiratory:  Negative for cough, chest tightness and shortness of breath.   Cardiovascular:  Negative for chest pain, palpitations and leg swelling.  Gastrointestinal:  Negative for abdominal pain.  Endocrine: Negative for polydipsia and polyuria.  Genitourinary:  Negative for dysuria and hematuria.  Musculoskeletal:  Positive for arthralgias and gait problem.  Neurological:  Negative for tremors, numbness and headaches.  Psychiatric/Behavioral:  Negative for dysphoric mood and sleep disturbance. The patient is not nervous/anxious.     Patient Active Problem List   Diagnosis Date Noted   Hyperlipidemia associated with type 2 diabetes mellitus ([place]) 01/09/2022   Status post total hysterectomy and bilateral salpingo-oophorectomy 03/01/2020   Gastroesophageal reflux disease without esophagitis 12/24/2017   History of Clostridium difficile colitis 04/04/2017   Restless legs syndrome 12/31/2016   Essential hypertension 12/31/2016   Lymphadenopathy, axillary 04/25/2015   Type II diabetes mellitus with complication ([place]) 0000000   Insomnia 03/11/2015   Chronic abdominal pain 11/17/2014   Anxiety, generalized 11/17/2014   Depression, major, recurrent, 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   Migraine without aura and responsive to treatment 11/17/2014   Asthma, moderate persistent 11/17/2014    Allergies  Allergen Reactions   Macrobid [Nitrofurantoin] Other (See Comments)    "Patient could not walk"  Nsaids Shortness Of Breath   Doxycycline Itching   Januvia [Sitagliptin] Nausea And Vomiting   Sulfa Antibiotics Rash   Aspirin    Ketorolac     Other reaction(s): Other (See Comments) Patient states Burning sensation inside Other  reaction(s): Other (See Comments) Patient states Burning sensation inside   Morphine Sulfate Nausea And Vomiting   Nalbuphine     Other reaction(s): Other (See Comments) Burning on inside   Prochlorperazine Edisylate Other (See Comments)    tachycardia   Trazodone Cough   Bupropion Rash   Oxybutynin Rash    Past Surgical History:  Procedure Laterality Date   APPENDECTOMY     BILATERAL SALPINGOOPHORECTOMY  05/07/2008   CHOLECYSTECTOMY  05/08/2003   COLONOSCOPY  05/07/2014   TOTAL ABDOMINAL HYSTERECTOMY  1999    Social History   Tobacco Use   Smoking status: Never   Smokeless tobacco: Never  Substance Use Topics   Alcohol use: No    Alcohol/week: 0.0 standard drinks of alcohol   Drug use: No     Medication list has been reviewed and updated.  Current Meds  Medication Sig   albuterol (VENTOLIN HFA) 108 (90 Base) MCG/ACT inhaler Inhale 2 puffs into the lungs every 4 (four) hours as needed for wheezing or shortness of breath.   clonazePAM (KLONOPIN) 1 MG tablet 2 (two) times daily as needed.    COMFORT EZ PEN NEEDLES 31G X 5 MM MISC USE TWO PEN NEEDLES DAILY   cyclobenzaprine (FLEXERIL) 10 MG tablet TAKE 1 TABLET BY MOUTH 3 TIMES DAILY   escitalopram (LEXAPRO) 20 MG tablet Take 40 mg by mouth daily.   fluticasone (FLONASE) 50 MCG/ACT nasal spray Place 2 sprays into both nostrils daily.   hydrOXYzine (ATARAX/VISTARIL) 25 MG tablet Take 2 tablets by mouth every 8 (eight) hours as needed.    isosorbide dinitrate (ISORDIL) 10 MG tablet Take 10 mg by mouth 2 (two) times daily.   lidocaine (LIDODERM) 5 % 1 patch daily.   liraglutide (VICTOZA) 18 MG/3ML SOPN INJECT 0.6mg  INTO THE SKIN DAILY AT 6am FOR 7 DAYS, THEN 1.2mg  DAILY AT 6aM FOR 7 DAYS, THEN 1.8mg  DAILY AT 6am THEREAFTER   losartan (COZAAR) 50 MG tablet TAKE 1 TABLET BY MOUTH DAILY   metFORMIN (GLUCOPHAGE) 500 MG tablet TAKE 3 TABLETS BY MOUTH DAILY WITH FOOD   mometasone (ASMANEX, 60 METERED DOSES,) 220 MCG/ACT inhaler  Inhale 2 puffs into the lungs daily.   montelukast (SINGULAIR) 10 MG tablet TAKE 1 TABLET BY MOUTH AT BEDTIME   pantoprazole (PROTONIX) 40 MG tablet TAKE 1 TABLET BY MOUTH TWICE DAILY   promethazine (PHENERGAN) 25 MG tablet Take 1 tablet (25 mg total) by mouth 2 (two) times daily as needed.   rizatriptan (MAXALT) 10 MG tablet Take 1 tablet (10 mg total) by mouth as needed for migraine. May repeat in 2 hours if needed   rOPINIRole (REQUIP) 1 MG tablet TAKE 3 TABLETS BY MOUTH AT BEDTIME (Patient taking differently: Take 3 mg by mouth at bedtime.)   [DISCONTINUED] Insulin Glargine (BASAGLAR KWIKPEN) 100 UNIT/ML Inject 30 Units into the skin daily.       07/20/2022    2:25 PM 01/19/2022    8:12 AM 11/10/2021   11:00 AM 09/19/2021   10:55 AM  GAD 7 : Generalized Anxiety Score  Nervous, Anxious, on Edge 1 0 0 0  Control/stop worrying 1 1 0 1  Worry too much - different things 1 1 1 1   Trouble relaxing 1 0  1 0  Restless 1 0 0 1  Easily annoyed or irritable 1 1 1 1   Afraid - awful might happen 0 0 0 1  Total GAD 7 Score 6 3 3 5   Anxiety Difficulty Somewhat difficult Not difficult at all Somewhat difficult Not difficult at all       07/20/2022    2:25 PM 01/19/2022    8:12 AM 11/10/2021   11:00 AM  Depression screen PHQ 2/9  Decreased Interest 1 0 1  Down, Depressed, Hopeless 1 1 1   PHQ - 2 Score 2 1 2   Altered sleeping 2 1 1   Tired, decreased energy 1 1 1   Change in appetite 1 1 1   Feeling bad or failure about yourself  0 0 0  Trouble concentrating 1 1 1   Moving slowly or fidgety/restless 1 0 0  Suicidal thoughts 0 0 0  PHQ-9 Score 8 5 6   Difficult doing work/chores Not difficult at all Somewhat difficult Somewhat difficult    BP Readings from Last 3 Encounters:  07/20/22 120/70  01/19/22 128/74  11/10/21 122/70    Physical Exam Vitals and nursing note reviewed.  Constitutional:      General: She is not in acute distress.    Appearance: Normal appearance. She is  well-developed.  HENT:     Head: Normocephalic and atraumatic.  Cardiovascular:     Rate and Rhythm: Normal rate and regular rhythm.  Pulmonary:     Effort: Pulmonary effort is normal. No respiratory distress.     Breath sounds: No wheezing or rhonchi.  Musculoskeletal:        General: Swelling (right knee) present.     Cervical back: Normal range of motion.     Right lower leg: No edema.     Left lower leg: No edema.  Lymphadenopathy:     Cervical: No cervical adenopathy.  Skin:    General: Skin is warm and dry.     Findings: No rash.  Neurological:     Mental Status: She is alert and oriented to person, place, and time.  Psychiatric:        Mood and Affect: Mood normal.        Behavior: Behavior normal.     Wt Readings from Last 3 Encounters:  07/20/22 154 lb (69.9 kg)  01/19/22 164 lb 12.8 oz (74.8 kg)  11/10/21 164 lb (74.4 kg)    BP 120/70   Pulse (!) 108   Ht 5\' 1"  (1.549 m)   Wt 154 lb (69.9 kg)   SpO2 99%   BMI 29.10 kg/m   Assessment and Plan:  Problem List Items Addressed This Visit       Cardiovascular and Mediastinum   Essential hypertension (Chronic)   Relevant Orders   CBC with Differential/Platelet     Endocrine   Hyperlipidemia associated with type 2 diabetes mellitus ([place])   Relevant Medications   Insulin Glargine (BASAGLAR KWIKPEN) 100 UNIT/ML   Other Relevant Orders   Lipid panel   Type II diabetes mellitus with complication (HCC) - Primary (Chronic)    Clinically stable without s/s of hypoglycemia. Tolerating metformin, basal insulin and Victoza well without side effects or other concerns. Lab Results  Component Value Date   HGBA1C 8.3 (A) 01/19/2022  Needs to be < 7.5 for knee surgery. A1C 9.7 today Will add Basaglar 15 units in the PM and recheck A1C in 6 weeks      Relevant Medications   Insulin Glargine [place])  100 UNIT/ML   Other Relevant Orders   POCT HgB A1C (Completed)   Comprehensive metabolic panel     Return in about 6 weeks (around 08/31/2022) for DM.   Partially dictated using [place], any errors are not intentional.  Glean Hess, MD [place], [place]

## 2022-07-20 NOTE — Assessment & Plan Note (Addendum)
Clinically stable without s/s of hypoglycemia. Tolerating metformin, basal insulin and Victoza well without side effects or other concerns. Lab Results  Component Value Date   HGBA1C 8.3 (A) 01/19/2022  Needs to be < 7.5 for knee surgery. A1C 9.7 today Will add Basaglar 15 units in the PM and recheck A1C in 6 weeks

## 2022-07-21 LAB — CBC WITH DIFFERENTIAL/PLATELET
Basophils Absolute: 0.1 10*3/uL (ref 0.0–0.2)
Basos: 1 %
EOS (ABSOLUTE): 0.1 10*3/uL (ref 0.0–0.4)
Eos: 1 %
Hematocrit: 37.6 % (ref 34.0–46.6)
Hemoglobin: 12.2 g/dL (ref 11.1–15.9)
Immature Grans (Abs): 0 10*3/uL (ref 0.0–0.1)
Immature Granulocytes: 1 %
Lymphocytes Absolute: 2 10*3/uL (ref 0.7–3.1)
Lymphs: 26 %
MCH: 27.4 pg (ref 26.6–33.0)
MCHC: 32.4 g/dL (ref 31.5–35.7)
MCV: 85 fL (ref 79–97)
Monocytes Absolute: 0.5 10*3/uL (ref 0.1–0.9)
Monocytes: 6 %
Neutrophils Absolute: 5 10*3/uL (ref 1.4–7.0)
Neutrophils: 65 %
Platelets: 252 10*3/uL (ref 150–450)
RBC: 4.45 x10E6/uL (ref 3.77–5.28)
RDW: 13 % (ref 11.7–15.4)
WBC: 7.7 10*3/uL (ref 3.4–10.8)

## 2022-07-21 LAB — COMPREHENSIVE METABOLIC PANEL
ALT: 40 IU/L — ABNORMAL HIGH (ref 0–32)
AST: 55 IU/L — ABNORMAL HIGH (ref 0–40)
Albumin/Globulin Ratio: 1.4 (ref 1.2–2.2)
Albumin: 4.2 g/dL (ref 3.8–4.9)
Alkaline Phosphatase: 108 IU/L (ref 44–121)
BUN/Creatinine Ratio: 12 (ref 9–23)
BUN: 11 mg/dL (ref 6–24)
Bilirubin Total: 0.4 mg/dL (ref 0.0–1.2)
CO2: 25 mmol/L (ref 20–29)
Calcium: 9.5 mg/dL (ref 8.7–10.2)
Chloride: 97 mmol/L (ref 96–106)
Creatinine, Ser: 0.9 mg/dL (ref 0.57–1.00)
Globulin, Total: 2.9 g/dL (ref 1.5–4.5)
Glucose: 125 mg/dL — ABNORMAL HIGH (ref 70–99)
Potassium: 4 mmol/L (ref 3.5–5.2)
Sodium: 139 mmol/L (ref 134–144)
Total Protein: 7.1 g/dL (ref 6.0–8.5)
eGFR: 75 mL/min/{1.73_m2} (ref >59)

## 2022-07-21 LAB — LIPID PANEL
Chol/HDL Ratio: 4.7 ratio — ABNORMAL HIGH (ref 0.0–4.4)
Cholesterol, Total: 151 mg/dL (ref 100–199)
HDL: 32 mg/dL — ABNORMAL LOW (ref >39)
LDL Chol Calc (NIH): 73 mg/dL (ref 0–99)
Triglycerides: 281 mg/dL — ABNORMAL HIGH (ref 0–149)
VLDL Cholesterol Cal: 46 mg/dL — ABNORMAL HIGH (ref 5–40)

## 2022-07-23 ENCOUNTER — Other Ambulatory Visit: Payer: Self-pay

## 2022-07-23 DIAGNOSIS — E1169 Type 2 diabetes mellitus with other specified complication: Secondary | ICD-10-CM

## 2022-07-23 MED ORDER — ROSUVASTATIN CALCIUM 5 MG PO TABS: 5.0000 mg | ORAL_TABLET | ORAL | 2 refills | Status: AC

## 2022-07-31 ENCOUNTER — Other Ambulatory Visit: Payer: Self-pay | Admitting: Internal Medicine

## 2022-07-31 DIAGNOSIS — E118 Type 2 diabetes mellitus with unspecified complications: Secondary | ICD-10-CM

## 2022-08-01 NOTE — Telephone Encounter (Signed)
Requested Prescriptions  Pending Prescriptions Disp Refills   liraglutide (VICTOZA) 18 MG/3ML SOPN [Pharmacy Med Name: VICTOZA 18 MG/3ML SUBQ SOLN ML] 9 mL 0    Sig: INJECT 1.8mg  INTO THE SKIN DAILY AT 6am     Endocrinology:  Diabetes - GLP-1 Receptor Agonists Failed - 07/31/2022 12:21 PM      Failed - HBA1C is between 0 and 7.9 and within 180 days    Hemoglobin A1C  Date Value Ref Range Status  07/20/2022 9.7 (A) 4.0 - 5.6 % Final   Hgb A1c MFr Bld  Date Value Ref Range Status  07/14/2021 11.5 (H) 4.8 - 5.6 % Final    Comment:             Prediabetes: 5.7 - 6.4          Diabetes: >6.4          Glycemic control for adults with diabetes: <7.0          Passed - Valid encounter within last 6 months    Recent Outpatient Visits           1 week ago Type II diabetes mellitus with complication [facility])   [place] [facility] at [facility], Jesse Sans, MD   6 months ago Type II diabetes mellitus with complication [facility])   [place] [facility] at [facility], Jesse Sans, MD   8 months ago Syncope and collapse   [place] [facility] at [facility], Jesse Sans, MD   10 months ago Type II diabetes mellitus with complication [facility])   [place] [facility] at [facility], Jesse Sans, MD   1 year ago Type II diabetes mellitus with complication [facility])   [place] at [facility], Jesse Sans, MD       Future Appointments             In 1 month Army Melia, Jesse Sans, MD [place] at [facility], [facility]

## 2022-08-06 ENCOUNTER — Other Ambulatory Visit: Payer: Self-pay

## 2022-08-06 ENCOUNTER — Other Ambulatory Visit: Payer: Self-pay | Admitting: Internal Medicine

## 2022-08-06 ENCOUNTER — Telehealth: Payer: Self-pay | Admitting: Internal Medicine

## 2022-08-06 DIAGNOSIS — Z79899 Other long term (current) drug therapy: Secondary | ICD-10-CM | POA: Diagnosis not present

## 2022-08-06 DIAGNOSIS — G4700 Insomnia, unspecified: Secondary | ICD-10-CM | POA: Diagnosis not present

## 2022-08-06 DIAGNOSIS — F332 Major depressive disorder, recurrent severe without psychotic features: Secondary | ICD-10-CM | POA: Diagnosis not present

## 2022-08-06 DIAGNOSIS — F411 Generalized anxiety disorder: Secondary | ICD-10-CM | POA: Diagnosis not present

## 2022-08-06 DIAGNOSIS — F410 Panic disorder [episodic paroxysmal anxiety] without agoraphobia: Secondary | ICD-10-CM | POA: Diagnosis not present

## 2022-08-06 DIAGNOSIS — G8929 Other chronic pain: Secondary | ICD-10-CM | POA: Diagnosis not present

## 2022-08-06 DIAGNOSIS — M62830 Muscle spasm of back: Secondary | ICD-10-CM

## 2022-08-06 MED ORDER — RYBELSUS 7 MG PO TABS: 7.0000 mg | ORAL_TABLET | Freq: Every day | ORAL | 0 refills | Status: DC

## 2022-08-06 NOTE — Telephone Encounter (Signed)
Sent in Rybelsus 7mg  and called to inform patient but had to leave VM to inform.  Crystal Rich

## 2022-08-06 NOTE — Telephone Encounter (Signed)
The patient called in stating she spoke with her pharmacy as well as other pharmacies and she has been told that her  liraglutide ([place]) 18 MG/3ML SOPN  is on backorder. They just don't have it is stock right now. She is wondering what she should do in the meantime. Please assist patient further.

## 2022-08-07 ENCOUNTER — Telehealth: Payer: Self-pay | Admitting: Internal Medicine

## 2022-08-07 ENCOUNTER — Telehealth: Payer: Self-pay

## 2022-08-07 NOTE — Telephone Encounter (Signed)
COMPLETED PA ON [email or web address] for Ryblesus 7mg .  (Key: [facility]) Rx #[id number]  Awaiting outcome.

## 2022-08-07 NOTE — Telephone Encounter (Signed)
Copied from [place] [id number]. Topic: General - Other >> Aug 07, 2022  9:44 AM Crystal Rich wrote: Reason for CRM: Pt stated she was told that the Rx for Semaglutide (RYBELSUS) 7 MG TABS needs prior authorization

## 2022-08-07 NOTE — Telephone Encounter (Signed)
Completed PA and waiting on outcome. Documented in a separate note.

## 2022-08-10 NOTE — Telephone Encounter (Signed)
PA was denied. Formulary alternatives are Trulicity, and Victoza.

## 2022-08-10 NOTE — Telephone Encounter (Signed)
Pt is calling in wanting to know the status of her prior authorization of Ryblesus 7mg . Please follow up with pt.

## 2022-08-13 ENCOUNTER — Other Ambulatory Visit: Payer: Self-pay | Admitting: Internal Medicine

## 2022-08-13 ENCOUNTER — Other Ambulatory Visit: Payer: Self-pay

## 2022-08-13 DIAGNOSIS — E118 Type 2 diabetes mellitus with unspecified complications: Secondary | ICD-10-CM

## 2022-08-13 MED ORDER — NOVOLOG PENFILL 100 UNIT/ML ~~LOC~~ SOCT: 15.0000 [IU] | Freq: Every day | SUBCUTANEOUS | 2 refills | Status: DC

## 2022-08-14 ENCOUNTER — Other Ambulatory Visit: Payer: Self-pay | Admitting: Internal Medicine

## 2022-08-14 ENCOUNTER — Telehealth: Payer: Self-pay | Admitting: Internal Medicine

## 2022-08-14 NOTE — Telephone Encounter (Signed)
Copied from CRM [id number]. Topic: General - Inquiry >> Aug 14, 2022  3:11 PM Runell Gess P wrote: Reason for CRM: pt called with questions regarding her insulin and dosages .  Please return call at [phone]

## 2022-08-15 ENCOUNTER — Ambulatory Visit: Payer: Self-pay | Admitting: *Deleted

## 2022-08-15 NOTE — Telephone Encounter (Signed)
  Chief Complaint: Pt. Has a question regarding the timing of the Novolog insulin she takes before supper and the Basaglar she takes in the evening.   Is the dose too close together?   Needing clarification on taking the Novolog insulin and the Basaglar. Symptoms: None Frequency: N/A Pertinent Negatives: Patient denies N/A Disposition: [] ED /[] Urgent Care (no appt availability in office) / [] Appointment(In office/virtual)/ []  [facility]/ [] Home Care/ [] Refused Recommended Disposition /[] [facility]/  Follow-up with PCP Additional Notes: Pt agreeable to someone calling her back with further directions.

## 2022-08-15 NOTE — Telephone Encounter (Signed)
Patient informed per Dr [name] it is okay to take both at the same time before supper.  Crystal Rich

## 2022-08-15 NOTE — Telephone Encounter (Signed)
Requested medication (s) are due for refill today: yes  Requested medication (s) are on the active medication list: yes  Last refill:  06/16/20  Future visit scheduled: yes  Notes to clinic:  Unable to refill per protocol, Rx expired. Last refill 06/16/20, short supply given. Should patient continue to take, routing for approval.      Requested Prescriptions  Pending Prescriptions Disp Refills   rizatriptan (MAXALT) 10 MG tablet [Pharmacy Med Name: RIZATRIPTAN BENZOATE 10 MG TAB] 6 tablet 3    Sig: TAKE 1 TABLET BY MOUTH AS NEEDED FOR MIGRAINE. MAY REPEAT IN 2 HOURS IF NEEDED     Neurology:  Migraine Therapy - Triptan Passed - 08/14/2022  9:37 AM      Passed - Last BP in normal range    BP Readings from Last 1 Encounters:  07/20/22 120/70         Passed - Valid encounter within last 12 months    Recent Outpatient Visits           3 weeks ago Type II diabetes mellitus with complication [facility])   [place] [facility] at [facility], [name], MD   6 months ago Type II diabetes mellitus with complication [facility])   [place] [facility] at [facility], [name], MD   9 months ago Syncope and collapse   [place] [facility] at [facility], [name], MD   11 months ago Type II diabetes mellitus with complication [facility])   [place] [facility] at [facility], [name], MD   1 year ago Type II diabetes mellitus with complication [facility])   [place] [facility] at [facility], [name], MD       Future Appointments             In 2 weeks [name], [name], MD [facility] at [facility], [facility]

## 2022-08-15 NOTE — Telephone Encounter (Signed)
Message from Crystal Rich sent at 08/15/2022  8:57 AM EDT  Summary: questions on how to take insulin   Pt has questions regarding on how to take her insulin, pharmacy told her it may be too much This is a new short acting insulin   Please advise          Call History   Type Contact Phone/Fax User  08/15/2022 08:54 AM EDT Phone (Incoming) Anijha, Smolinsky (Self) [phone] [name] Rich   Reason for Disposition  [1] Caller has URGENT medicine question about med that PCP or specialist prescribed AND [2] triager unable to answer question  Answer Assessment - Initial Assessment Questions 1. NAME of MEDICINE: "What medicine(s) are you calling about?"     Insulin Novolog insulin.  This is new for me.   I've been taking the Basaglar morning and night.   I'm to take before supper 15 units of the Novolog insulin.  Is that too close to my night time dose of Basaglar?      I take a long acting insulin at bed time.     2. QUESTION: "What is your question?" (e.g., double dose of medicine, side effect)     Is the insulin dose at night time too close to the time I take the Basaglar? 3. PRESCRIBER: "Who prescribed the medicine?" Reason: if prescribed by specialist, call should be referred to that group.     Dr. [name] 4. SYMPTOMS: "Do you have any symptoms?" If Yes, ask: "What symptoms are you having?"  "How bad are the symptoms (e.g., mild, moderate, severe)     None 5. PREGNANCY:  "Is there any chance that you are pregnant?" "When was your last menstrual period?"     Not asked  Protocols used: Medication Question Call-A-AH

## 2022-08-31 ENCOUNTER — Other Ambulatory Visit: Payer: Self-pay | Admitting: Internal Medicine

## 2022-08-31 DIAGNOSIS — I10 Essential (primary) hypertension: Secondary | ICD-10-CM

## 2022-08-31 NOTE — Telephone Encounter (Signed)
Requested Prescriptions  Pending Prescriptions Disp Refills   losartan (COZAAR) 50 MG tablet [Pharmacy Med Name: LOSARTAN POTASSIUM 50 MG TAB] 90 tablet 1    Sig: TAKE 1 TABLET BY MOUTH DAILY     Cardiovascular:  Angiotensin Receptor Blockers Passed - 08/31/2022 11:09 AM      Passed - Cr in normal range and within 180 days    Creatinine, Ser  Date Value Ref Range Status  07/20/2022 0.90 0.57 - 1.00 mg/dL Final         Passed - K in normal range and within 180 days    Potassium  Date Value Ref Range Status  07/20/2022 4.0 3.5 - 5.2 mmol/L Final         Passed - Patient is not pregnant      Passed - Last BP in normal range    BP Readings from Last 1 Encounters:  07/20/22 120/70         Passed - Valid encounter within last 6 months    Recent Outpatient Visits           1 month ago Type II diabetes mellitus with complication [facility])   [place] [facility] at [facility], [name], MD   7 months ago Type II diabetes mellitus with complication [facility])   [place] [facility] at [facility], [name], MD   9 months ago Syncope and collapse   [place] [facility] at [facility], [name], MD   11 months ago Type II diabetes mellitus with complication [facility])   [place] [facility] at [facility], [name], MD   1 year ago Type II diabetes mellitus with complication [facility])   [place] [facility] at [facility], [name], MD       Future Appointments             In 4 days [name] [name], MD [facility] at [facility], [facility]

## 2022-09-01 ENCOUNTER — Other Ambulatory Visit: Payer: Self-pay | Admitting: Internal Medicine

## 2022-09-01 DIAGNOSIS — K219 Gastro-esophageal reflux disease without esophagitis: Secondary | ICD-10-CM

## 2022-09-04 ENCOUNTER — Ambulatory Visit (INDEPENDENT_AMBULATORY_CARE_PROVIDER_SITE_OTHER): Payer: 59 | Admitting: Internal Medicine

## 2022-09-04 ENCOUNTER — Encounter: Payer: Self-pay | Admitting: Internal Medicine

## 2022-09-04 VITALS — BP 128/78 | HR 110 | Ht 61.0 in | Wt 160.0 lb

## 2022-09-04 DIAGNOSIS — E118 Type 2 diabetes mellitus with unspecified complications: Secondary | ICD-10-CM | POA: Diagnosis not present

## 2022-09-04 DIAGNOSIS — J4540 Moderate persistent asthma, uncomplicated: Secondary | ICD-10-CM

## 2022-09-04 DIAGNOSIS — F3341 Major depressive disorder, recurrent, in partial remission: Secondary | ICD-10-CM | POA: Diagnosis not present

## 2022-09-04 LAB — POCT GLYCOSYLATED HEMOGLOBIN (HGB A1C): Hemoglobin A1C: 9.1 % — AB (ref 4.0–5.6)

## 2022-09-04 MED ORDER — ALBUTEROL SULFATE HFA 108 (90 BASE) MCG/ACT IN AERS: 2.0000 | INHALATION_SPRAY | RESPIRATORY_TRACT | 5 refills | Status: DC | PRN

## 2022-09-04 NOTE — Assessment & Plan Note (Addendum)
Working to get A1C down to < 7.5 for surgery. Added Novolog 1 units before supper 6 weeks ago; taking Basaglar 15 units bid Seeing BS much improved. Last A1C 9.7 A1C today = 9.1 Increase AM Basaglar to 30 units and increase Novolog to 15 units

## 2022-09-04 NOTE — Patient Instructions (Addendum)
Basaglar - 30 units in morning and 15 units in evening  Novolog - 15 units before supper

## 2022-09-04 NOTE — Assessment & Plan Note (Signed)
Clinically stable on current regimen with good control of symptoms, No SI or HI. No change in management at this time. Continue Lexapro

## 2022-09-04 NOTE — Progress Notes (Signed)
Date:  09/04/2022   Name:  Crystal Rich   DOB:  11/22/1965   MRN:  [id number]   Chief Complaint: Diabetes  Diabetes She presents for her follow-up diabetic visit. She has type 2 diabetes mellitus. Her disease course has been improving. Pertinent negatives for hypoglycemia include no headaches or tremors. Pertinent negatives for diabetes include no chest pain, no fatigue, no polydipsia and no polyuria.  Added Novolog before supper to help lower A1C in prep for knee surgery.  Lab Results  Component Value Date   NA 139 07/20/2022   K 4.0 07/20/2022   CO2 25 07/20/2022   GLUCOSE 125 (H) 07/20/2022   BUN 11 07/20/2022   CREATININE 0.90 07/20/2022   CALCIUM 9.5 07/20/2022   EGFR 75 07/20/2022   GFRNONAA 85 08/17/2019   Lab Results  Component Value Date   CHOL 151 07/20/2022   HDL 32 (L) 07/20/2022   LDLCALC 73 07/20/2022   TRIG 281 (H) 07/20/2022   CHOLHDL 4.7 (H) 07/20/2022   Lab Results  Component Value Date   TSH 4.690 (H) 01/05/2021   Lab Results  Component Value Date   HGBA1C 9.1 (A) 09/04/2022   Lab Results  Component Value Date   WBC 7.7 07/20/2022   HGB 12.2 07/20/2022   HCT 37.6 07/20/2022   MCV 85 07/20/2022   PLT 252 07/20/2022   Lab Results  Component Value Date   ALT 40 (H) 07/20/2022   AST 55 (H) 07/20/2022   ALKPHOS 108 07/20/2022   BILITOT 0.4 07/20/2022   No results found for: "25OHVITD2", "25OHVITD3", "VD25OH"   Review of Systems  Constitutional:  Negative for appetite change, fatigue, fever and unexpected weight change.  HENT:  Negative for tinnitus and trouble swallowing.   Eyes:  Negative for visual disturbance.  Respiratory:  Negative for cough, chest tightness and shortness of breath.   Cardiovascular:  Negative for chest pain, palpitations and leg swelling.  Gastrointestinal:  Negative for abdominal pain.  Endocrine: Negative for polydipsia and polyuria.  Genitourinary:  Negative for dysuria and hematuria.  Musculoskeletal:   Negative for arthralgias.  Neurological:  Negative for tremors, numbness and headaches.  Psychiatric/Behavioral:  Negative for dysphoric mood.     Patient Active Problem List   Diagnosis Date Noted   Hyperlipidemia associated with type 2 diabetes mellitus (HCC) 01/09/2022   Status post total hysterectomy and bilateral salpingo-oophorectomy 03/01/2020   Gastroesophageal reflux disease without esophagitis 12/24/2017   History of Clostridium difficile colitis 04/04/2017   Restless legs syndrome 12/31/2016   Essential hypertension 12/31/2016   Lymphadenopathy, axillary 04/25/2015   Type II diabetes mellitus with complication (HCC) 04/25/2015   Insomnia 03/11/2015   Chronic abdominal pain 11/17/2014   Anxiety, generalized 11/17/2014   Depression, major, recurrent, 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/2014   Migraine without aura and responsive to treatment 11/17/2014   Asthma, moderate persistent 11/17/2014    Allergies  Allergen Reactions   Macrobid [Nitrofurantoin] Other (See Comments)    "Patient could not walk"   Nsaids Shortness Of Breath   Doxycycline Itching   Januvia [Sitagliptin] Nausea And Vomiting   Sulfa Antibiotics Rash   Aspirin    Ketorolac     Other reaction(s): Other (See Comments) Patient states Burning sensation inside Other reaction(s): Other (See Comments) Patient states Burning sensation inside   Morphine Sulfate Nausea And Vomiting   Nalbuphine     Other reaction(s): Other (See Comments) Burning on inside   Prochlorperazine Edisylate Other (See Comments)  tachycardia   Trazodone Cough   Bupropion Rash   Oxybutynin Rash    Past Surgical History:  Procedure Laterality Date   APPENDECTOMY     BILATERAL SALPINGOOPHORECTOMY  05/07/2008   CHOLECYSTECTOMY  05/08/2003   COLONOSCOPY  05/07/2014   TOTAL ABDOMINAL HYSTERECTOMY  1999    Social History   Tobacco Use   Smoking status: Never   Smokeless tobacco: Never  Substance Use Topics   Alcohol  use: No    Alcohol/week: 0.0 standard drinks of alcohol   Drug use: No     Medication list has been reviewed and updated.  Current Meds  Medication Sig   clonazePAM (KLONOPIN) 1 MG tablet 2 (two) times daily as needed.    COMFORT EZ PEN NEEDLES 31G X 5 MM MISC USE TWO PEN NEEDLES DAILY   cyclobenzaprine (FLEXERIL) 10 MG tablet TAKE 1 TABLET BY MOUTH 3 TIMES DAILY   escitalopram (LEXAPRO) 20 MG tablet Take 40 mg by mouth daily.   fluticasone (FLONASE) 50 MCG/ACT nasal spray Place 2 sprays into both nostrils daily.   hydrOXYzine (ATARAX/VISTARIL) 25 MG tablet Take 2 tablets by mouth every 8 (eight) hours as needed.    insulin aspart (NOVOLOG PENFILL) cartridge Inject 15 Units into the skin daily before supper.   Insulin Glargine (BASAGLAR KWIKPEN) 100 UNIT/ML 30 units every AM and 15 units every PM   isosorbide dinitrate (ISORDIL) 10 MG tablet Take 10 mg by mouth 2 (two) times daily.   lidocaine (LIDODERM) 5 % 1 patch daily.   liraglutide (VICTOZA) 18 MG/3ML SOPN INJECT 1.8mg  INTO THE SKIN DAILY AT 6am   losartan (COZAAR) 50 MG tablet TAKE 1 TABLET BY MOUTH DAILY   metFORMIN (GLUCOPHAGE) 500 MG tablet TAKE 3 TABLETS BY MOUTH DAILY WITH FOOD   mometasone (ASMANEX, 60 METERED DOSES,) 220 MCG/ACT inhaler Inhale 2 puffs into the lungs daily.   montelukast (SINGULAIR) 10 MG tablet TAKE 1 TABLET BY MOUTH AT BEDTIME   pantoprazole (PROTONIX) 40 MG tablet TAKE 1 TABLET BY MOUTH TWICE DAILY   promethazine (PHENERGAN) 25 MG tablet Take 1 tablet (25 mg total) by mouth 2 (two) times daily as needed.   rizatriptan (MAXALT) 10 MG tablet TAKE 1 TABLET BY MOUTH AS NEEDED FOR MIGRAINE. MAY REPEAT IN 2 HOURS IF NEEDED   rOPINIRole (REQUIP) 1 MG tablet TAKE 3 TABLETS BY MOUTH AT BEDTIME (Patient taking differently: Take 3 mg by mouth at bedtime.)   rosuvastatin (CRESTOR) 5 MG tablet Take 1 tablet (5 mg total) by mouth 3 (three) times a week. Monday, Wednesday, Friday   [DISCONTINUED] albuterol (VENTOLIN  HFA) 108 (90 Base) MCG/ACT inhaler Inhale 2 puffs into the lungs every 4 (four) hours as needed for wheezing or shortness of breath.   [DISCONTINUED] Semaglutide (RYBELSUS) 7 MG TABS Take 1 tablet (7 mg total) by mouth daily.       09/04/2022    2:22 PM 07/20/2022    2:25 PM 01/19/2022    8:12 AM 11/10/2021   11:00 AM  GAD 7 : Generalized Anxiety Score  Nervous, Anxious, on Edge 1 1 0 0  Control/stop worrying 1 1 1  0  Worry too much - different things 1 1 1 1   Trouble relaxing 1 1 0 1  Restless 0 1 0 0  Easily annoyed or irritable 1 1 1 1   Afraid - awful might happen 0 0 0 0  Total GAD 7 Score 5 6 3 3   Anxiety Difficulty Somewhat difficult Somewhat difficult Not  difficult at all Somewhat difficult       09/04/2022    2:22 PM 07/20/2022    2:25 PM 01/19/2022    8:12 AM  Depression screen PHQ 2/9  Decreased Interest 1 1 0  Down, Depressed, Hopeless 1 1 1   PHQ - 2 Score 2 2 1   Altered sleeping 1 2 1   Tired, decreased energy 1 1 1   Change in appetite 0 1 1  Feeling bad or failure about yourself  0 0 0  Trouble concentrating 1 1 1   Moving slowly or fidgety/restless 0 1 0  Suicidal thoughts 0 0 0  PHQ-9 Score 5 8 5   Difficult doing work/chores Not difficult at all Not difficult at all Somewhat difficult    BP Readings from Last 3 Encounters:  09/04/22 128/78  07/20/22 120/70  01/19/22 128/74    Physical Exam Vitals and nursing note reviewed.  Constitutional:      General: She is not in acute distress.    Appearance: She is well-developed.  HENT:     Head: Normocephalic and atraumatic.  Cardiovascular:     Rate and Rhythm: Normal rate and regular rhythm.     Pulses: Normal pulses.  Pulmonary:     Effort: Pulmonary effort is normal. No respiratory distress.     Breath sounds: No wheezing or rhonchi.  Musculoskeletal:        General: Normal range of motion.     Right lower leg: No edema.     Left lower leg: No edema.  Skin:    General: Skin is warm and dry.      Capillary Refill: Capillary refill takes less than 2 seconds.     Findings: No rash.  Neurological:     General: No focal deficit present.     Mental Status: She is alert and oriented to person, place, and time.  Psychiatric:        Mood and Affect: Mood normal.        Behavior: Behavior normal.     Wt Readings from Last 3 Encounters:  09/04/22 160 lb (72.6 kg)  07/20/22 154 lb (69.9 kg)  01/19/22 164 lb 12.8 oz (74.8 kg)    BP 128/78   Pulse (!) 110   Ht 5\' 1"  (1.549 m)   Wt 160 lb (72.6 kg)   SpO2 97%   BMI 30.23 kg/m   Assessment and Plan:  Problem List Items Addressed This Visit       Respiratory   Asthma, moderate persistent   Relevant Medications   albuterol (VENTOLIN HFA) 108 (90 Base) MCG/ACT inhaler     Endocrine   Type II diabetes mellitus with complication (HCC) - Primary (Chronic)    Working to get A1C down to < 7.5 for surgery. Added Novolog 1 units before supper 6 weeks ago; taking Basaglar 15 units bid Seeing BS much improved. Last A1C 9.7 A1C today = 9.1 Increase AM Basaglar to 30 units and increase Novolog to 15 units      Relevant Orders   POCT glycosylated hemoglobin (Hb A1C) (Completed)   Microalbumin / creatinine urine ratio     Other   Depression, major, recurrent, in partial remission (HCC) (Chronic)    Clinically stable on current regimen with good control of symptoms, No SI or HI. No change in management at this time. Continue Lexapro       Return in about 6 weeks (around 10/16/2022) for DM.   Partially dictated using Dragon software, any errors  are not intentional.  Reubin Milan, MD [facility] [place], [place]

## 2022-09-05 LAB — MICROALBUMIN / CREATININE URINE RATIO
Creatinine, Urine: 25.8 mg/dL
Microalb/Creat Ratio: 34 mg/g creat — ABNORMAL HIGH (ref 0–29)
Microalbumin, Urine: 8.9 ug/mL

## 2022-09-05 LAB — SPECIMEN STATUS REPORT

## 2022-09-18 ENCOUNTER — Other Ambulatory Visit: Payer: Self-pay | Admitting: Internal Medicine

## 2022-09-24 ENCOUNTER — Other Ambulatory Visit: Payer: Self-pay | Admitting: Internal Medicine

## 2022-09-24 DIAGNOSIS — F332 Major depressive disorder, recurrent severe without psychotic features: Secondary | ICD-10-CM | POA: Diagnosis not present

## 2022-09-24 DIAGNOSIS — G8929 Other chronic pain: Secondary | ICD-10-CM | POA: Diagnosis not present

## 2022-09-24 DIAGNOSIS — F411 Generalized anxiety disorder: Secondary | ICD-10-CM | POA: Diagnosis not present

## 2022-09-24 DIAGNOSIS — F410 Panic disorder [episodic paroxysmal anxiety] without agoraphobia: Secondary | ICD-10-CM | POA: Diagnosis not present

## 2022-09-24 DIAGNOSIS — Z79899 Other long term (current) drug therapy: Secondary | ICD-10-CM | POA: Diagnosis not present

## 2022-09-24 DIAGNOSIS — G2581 Restless legs syndrome: Secondary | ICD-10-CM

## 2022-10-16 ENCOUNTER — Other Ambulatory Visit: Payer: Self-pay | Admitting: Internal Medicine

## 2022-10-16 DIAGNOSIS — E118 Type 2 diabetes mellitus with unspecified complications: Secondary | ICD-10-CM | POA: Diagnosis not present

## 2022-10-16 LAB — HM DIABETES EYE EXAM

## 2022-10-17 DIAGNOSIS — G8929 Other chronic pain: Secondary | ICD-10-CM | POA: Diagnosis not present

## 2022-10-17 DIAGNOSIS — F410 Panic disorder [episodic paroxysmal anxiety] without agoraphobia: Secondary | ICD-10-CM | POA: Diagnosis not present

## 2022-10-17 DIAGNOSIS — G4700 Insomnia, unspecified: Secondary | ICD-10-CM | POA: Diagnosis not present

## 2022-10-17 DIAGNOSIS — F411 Generalized anxiety disorder: Secondary | ICD-10-CM | POA: Diagnosis not present

## 2022-10-17 DIAGNOSIS — F332 Major depressive disorder, recurrent severe without psychotic features: Secondary | ICD-10-CM | POA: Diagnosis not present

## 2022-10-17 DIAGNOSIS — Z79899 Other long term (current) drug therapy: Secondary | ICD-10-CM | POA: Diagnosis not present

## 2022-10-17 LAB — COMPREHENSIVE METABOLIC PANEL
ALT: 36 IU/L — ABNORMAL HIGH (ref 0–32)
AST: 46 IU/L — ABNORMAL HIGH (ref 0–40)
Albumin/Globulin Ratio: 1.6
Albumin: 4.4 g/dL (ref 3.8–4.9)
Alkaline Phosphatase: 120 IU/L (ref 44–121)
BUN/Creatinine Ratio: 11 (ref 9–23)
BUN: 8 mg/dL (ref 6–24)
Bilirubin Total: <0.2 mg/dL (ref 0.0–1.2)
CO2: 23 mmol/L (ref 20–29)
Calcium: 9.4 mg/dL (ref 8.7–10.2)
Chloride: 97 mmol/L (ref 96–106)
Creatinine, Ser: 0.71 mg/dL (ref 0.57–1.00)
Globulin, Total: 2.7 g/dL (ref 1.5–4.5)
Glucose: 247 mg/dL — ABNORMAL HIGH (ref 70–99)
Potassium: 4.1 mmol/L (ref 3.5–5.2)
Sodium: 138 mmol/L (ref 134–144)
Total Protein: 7.1 g/dL (ref 6.0–8.5)
eGFR: 100 mL/min/{1.73_m2} (ref >59)

## 2022-10-17 LAB — HEMOGLOBIN A1C
Est. average glucose Bld gHb Est-mCnc: 226 mg/dL
Hgb A1c MFr Bld: 9.5 % — ABNORMAL HIGH (ref 4.8–5.6)

## 2022-10-24 ENCOUNTER — Ambulatory Visit (INDEPENDENT_AMBULATORY_CARE_PROVIDER_SITE_OTHER): Payer: 59 | Admitting: Internal Medicine

## 2022-10-24 ENCOUNTER — Encounter: Payer: Self-pay | Admitting: Internal Medicine

## 2022-10-24 DIAGNOSIS — Z794 Long term (current) use of insulin: Secondary | ICD-10-CM

## 2022-10-24 DIAGNOSIS — E118 Type 2 diabetes mellitus with unspecified complications: Secondary | ICD-10-CM

## 2022-10-24 MED ORDER — DEXCOM G7 SENSOR MISC
1.0000 | Freq: Every day | 5 refills | Status: DC
Start: 2022-10-24 — End: 2023-04-18

## 2022-10-24 MED ORDER — VICTOZA 18 MG/3ML ~~LOC~~ SOPN: PEN_INJECTOR | SUBCUTANEOUS | 1 refills | Status: DC

## 2022-10-24 NOTE — Assessment & Plan Note (Addendum)
Recent A1C much higher despite Basaglar bid, Novolog before supper and metformin bid. She stopped Victoza in early April due to lack of availability but unfortunately her glucoses are not controlled. Will resume Victoza but titrate up every 2 weeks to 1.8 mg daily; stop Novolog due to N/V.  Continue metformin. Also start Dexcom 7 - sample and Rx given. Endo referral. Follow up 3 months

## 2022-10-24 NOTE — Progress Notes (Signed)
Date:  10/24/2022   Name:  Crystal Rich   DOB:  Dec 29, 1965   MRN:  [id number]   Chief Complaint: Diabetes  Diabetes She presents for her follow-up diabetic visit. She has type 2 diabetes mellitus. Her disease course has been worsening. Pertinent negatives for hypoglycemia include no nervousness/anxiousness. Pertinent negatives for diabetes include no fatigue.    Lab Results  Component Value Date   NA 138 10/16/2022   K 4.1 10/16/2022   CO2 23 10/16/2022   GLUCOSE 247 (H) 10/16/2022   BUN 8 10/16/2022   CREATININE 0.71 10/16/2022   CALCIUM 9.4 10/16/2022   EGFR 100 10/16/2022   GFRNONAA 85 08/17/2019   Lab Results  Component Value Date   CHOL 151 07/20/2022   HDL 32 (L) 07/20/2022   LDLCALC 73 07/20/2022   TRIG 281 (H) 07/20/2022   CHOLHDL 4.7 (H) 07/20/2022   Lab Results  Component Value Date   TSH 4.690 (H) 01/05/2021   Lab Results  Component Value Date   HGBA1C 9.5 (H) 10/16/2022   Lab Results  Component Value Date   WBC 7.7 07/20/2022   HGB 12.2 07/20/2022   HCT 37.6 07/20/2022   MCV 85 07/20/2022   PLT 252 07/20/2022   Lab Results  Component Value Date   ALT 36 (H) 10/16/2022   AST 46 (H) 10/16/2022   ALKPHOS 120 10/16/2022   BILITOT <0.2 10/16/2022   No results found for: "25OHVITD2", "25OHVITD3", "VD25OH"   Review of Systems  Constitutional:  Negative for chills, fatigue and fever.  Respiratory:  Negative for chest tightness and shortness of breath.   Gastrointestinal:  Positive for abdominal pain, nausea and vomiting.  Musculoskeletal:  Positive for gait problem. Arthralgias: knee pain - needs surgery. Psychiatric/Behavioral:  Negative for dysphoric mood and sleep disturbance. The patient is not nervous/anxious.     Patient Active Problem List   Diagnosis Date Noted   Hyperlipidemia associated with type 2 diabetes mellitus (HCC) 01/09/2022   Status post total hysterectomy and bilateral salpingo-oophorectomy 03/01/2020    Gastroesophageal reflux disease without esophagitis 12/24/2017   History of Clostridium difficile colitis 04/04/2017   Restless legs syndrome 12/31/2016   Essential hypertension 12/31/2016   Lymphadenopathy, axillary 04/25/2015   Type II diabetes mellitus with complication (HCC) 04/25/2015   Insomnia 03/11/2015   Chronic abdominal pain 11/17/2014   Anxiety, generalized 11/17/2014   Depression, major, recurrent, 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/2014   Migraine without aura and responsive to treatment 11/17/2014   Asthma, moderate persistent 11/17/2014    Allergies  Allergen Reactions   Macrobid [Nitrofurantoin] Other (See Comments)    "Patient could not walk"   Nsaids Shortness Of Breath   Doxycycline Itching   Januvia [Sitagliptin] Nausea And Vomiting   Sulfa Antibiotics Rash   Aspirin    Ketorolac     Other reaction(s): Other (See Comments) Patient states Burning sensation inside Other reaction(s): Other (See Comments) Patient states Burning sensation inside   Morphine Sulfate Nausea And Vomiting   Nalbuphine     Other reaction(s): Other (See Comments) Burning on inside   Prochlorperazine Edisylate Other (See Comments)    tachycardia   Trazodone Cough   Bupropion Rash   Oxybutynin Rash    Past Surgical History:  Procedure Laterality Date   APPENDECTOMY     BILATERAL SALPINGOOPHORECTOMY  05/07/2008   CHOLECYSTECTOMY  05/08/2003   COLONOSCOPY  05/07/2014   TOTAL ABDOMINAL HYSTERECTOMY  1999    Social History   Tobacco Use  Smoking status: Never   Smokeless tobacco: Never  Substance Use Topics   Alcohol use: No    Alcohol/week: 0.0 standard drinks of alcohol   Drug use: No     Medication list has been reviewed and updated.  Current Meds  Medication Sig   albuterol (VENTOLIN HFA) 108 (90 Base) MCG/ACT inhaler Inhale 2 puffs into the lungs every 4 (four) hours as needed for wheezing or shortness of breath.   clonazePAM (KLONOPIN) 1 MG tablet 2 (two) times  daily as needed.    COMFORT EZ PEN NEEDLES 31G X 5 MM MISC USE TWO PEN NEEDLES DAILY   Continuous Glucose Sensor (DEXCOM G7 SENSOR) MISC 1 each by Does not apply route daily.   cyclobenzaprine (FLEXERIL) 10 MG tablet TAKE 1 TABLET BY MOUTH 3 TIMES DAILY   escitalopram (LEXAPRO) 20 MG tablet Take 40 mg by mouth daily.   fluticasone (FLONASE) 50 MCG/ACT nasal spray Place 2 sprays into both nostrils daily.   hydrOXYzine (ATARAX/VISTARIL) 25 MG tablet Take 2 tablets by mouth every 8 (eight) hours as needed.    Insulin Glargine (BASAGLAR KWIKPEN) 100 UNIT/ML 30 units every AM and 15 units every PM   isosorbide dinitrate (ISORDIL) 10 MG tablet Take 10 mg by mouth 2 (two) times daily.   lidocaine (LIDODERM) 5 % 1 patch daily.   losartan (COZAAR) 50 MG tablet TAKE 1 TABLET BY MOUTH DAILY   metFORMIN (GLUCOPHAGE) 500 MG tablet TAKE 3 TABLETS BY MOUTH DAILY WITH FOOD   mometasone (ASMANEX, 60 METERED DOSES,) 220 MCG/ACT inhaler Inhale 2 puffs into the lungs daily.   montelukast (SINGULAIR) 10 MG tablet TAKE 1 TABLET BY MOUTH AT BEDTIME   pantoprazole (PROTONIX) 40 MG tablet TAKE 1 TABLET BY MOUTH TWICE DAILY   promethazine (PHENERGAN) 25 MG tablet Take 1 tablet (25 mg total) by mouth 2 (two) times daily as needed.   rizatriptan (MAXALT) 10 MG tablet TAKE 1 TABLET BY MOUTH AS NEEDED FOR MIGRAINE. MAY REPEAT IN 2 HOURS IF NEEDED   rOPINIRole (REQUIP) 1 MG tablet TAKE 3 TABLETS BY MOUTH AT BEDTIME   rosuvastatin (CRESTOR) 5 MG tablet Take 1 tablet (5 mg total) by mouth 3 (three) times a week. Monday, Wednesday, Friday   [DISCONTINUED] insulin aspart (NOVOLOG PENFILL) cartridge Inject 15 Units into the skin daily before supper.       10/24/2022    1:57 PM 09/04/2022    2:22 PM 07/20/2022    2:25 PM 01/19/2022    8:12 AM  GAD 7 : Generalized Anxiety Score  Nervous, Anxious, on Edge 1 1 1  0  Control/stop worrying 1 1 1 1   Worry too much - different things 1 1 1 1   Trouble relaxing 0 1 1 0  Restless 0 0  1 0  Easily annoyed or irritable 1 1 1 1   Afraid - awful might happen 0 0 0 0  Total GAD 7 Score 4 5 6 3   Anxiety Difficulty Somewhat difficult Somewhat difficult Somewhat difficult Not difficult at all       10/24/2022    1:57 PM 09/04/2022    2:22 PM 07/20/2022    2:25 PM  Depression screen PHQ 2/9  Decreased Interest 1 1 1   Down, Depressed, Hopeless 1 1 1   PHQ - 2 Score 2 2 2   Altered sleeping 1 1 2   Tired, decreased energy 1 1 1   Change in appetite 0 0 1  Feeling bad or failure about yourself  0 0 0  Trouble concentrating 0 1 1  Moving slowly or fidgety/restless 0 0 1  Suicidal thoughts 0 0 0  PHQ-9 Score 4 5 8   Difficult doing work/chores Not difficult at all Not difficult at all Not difficult at all    BP Readings from Last 3 Encounters:  10/24/22 118/76  09/04/22 128/78  07/20/22 120/70    Physical Exam Vitals and nursing note reviewed.  Constitutional:      General: She is not in acute distress.    Appearance: She is well-developed.  HENT:     Head: Normocephalic and atraumatic.  Pulmonary:     Effort: Pulmonary effort is normal. No respiratory distress.  Skin:    General: Skin is warm and dry.     Findings: No rash.  Neurological:     Mental Status: She is alert and oriented to person, place, and time.  Psychiatric:        Mood and Affect: Mood normal.        Behavior: Behavior normal.     Wt Readings from Last 3 Encounters:  10/24/22 165 lb (74.8 kg)  09/04/22 160 lb (72.6 kg)  07/20/22 154 lb (69.9 kg)    BP 118/76   Pulse (!) 120   Ht 5\' 1"  (1.549 m)   Wt 165 lb (74.8 kg)   SpO2 96%   BMI 31.18 kg/m   Assessment and Plan:  Problem List Items Addressed This Visit     Type II diabetes mellitus with complication (HCC) (Chronic)    Recent A1C much higher despite Basaglar bid, Novolog before supper and metformin bid. She stopped Victoza in early April due to lack of availability but unfortunately her glucoses are not controlled. Will  resume Victoza but titrate up every 2 weeks to 1.8 mg daily; stop Novolog due to N/V.  Continue metformin. Also start Dexcom 7 - sample and Rx given. Endo referral. Follow up 3 months      Relevant Medications   liraglutide (VICTOZA) 18 MG/3ML SOPN   Continuous Glucose Sensor (DEXCOM G7 SENSOR) MISC   Other Relevant Orders   Ambulatory referral to Endocrinology    No follow-ups on file.   Partially dictated using Dragon software, any errors are not intentional.  Reubin Milan, MD [facility] [place], [place]

## 2022-10-29 ENCOUNTER — Ambulatory Visit: Payer: Self-pay

## 2022-10-29 NOTE — Telephone Encounter (Signed)
  Chief Complaint: seizures Symptoms: shaking/jerking, unconsciousness, HA before and after  Frequency: Saturday Pertinent Negatives: Patient denies any sx currently  Disposition: [] ED /[] Urgent Care (no appt availability in office) / [x] Appointment(In office/virtual)/ []  [facility]/ [] Home Care/ [] Refused Recommended Disposition /[] [facility]/ []  Follow-up with PCP Additional Notes: pt states she had a seizure Saturday that lasted approx 20 mins, husband was with her and videoed her sx. Pt fell in bathroom and hit head but doesn't have any pain today. Concerned about the seizures she has had 5 since seeing PCP on 10/24/22. Offered OV today but pt husband has to drive and he is working until [employer]. Pt preferred Friday at 0900. Scheduled and Care advice given and pt verbalized understanding.   Reason for Disposition  [1] Seizure of unknown duration AND [2] history of prior seizure(s) AND [3] back to baseline with no new concerning symptoms  Answer Assessment - Initial Assessment Questions 1. ONSET: "When did the seizure occur?"     Saturday  2. DURATION: "How long did the seizure last (or how long has it been happening)?" (e.g., seconds, minutes)  Note: Most seizures last less than 5 minutes.     20 mins  3. DESCRIPTION: "Describe what happened during the seizure." "Did the body become stiff?" "Was there any jerking?"  "Did they lose consciousness during the seizure?"     Shaking all over, unconsciousness 4. CIRCUMSTANCE: "What was the person doing when the seizure began?"      Was using bathroom and fell, HA before and after  5. MENTAL STATUS AFTER SEIZURE: "Does the person seem more groggy or sleepy?" "Does the person know who they are, who you are, and where they are now?"      HA worse after and tired feeling  6. PRIOR SEIZURES: "Has the person had a seizure (convulsion) before?" (e.g., epilepsy, other cause)  If Yes, ask: "When was the last time?" and "What happened  last time?"      Has had 5 total now  8. MEDICINES: "Does the person take anticonvulsant medications?" (e.g., Yes, No; missed doses, any recent changes)     Not one seizure meds 9. INJURY: "Was the person hurt or injured during the seizure?" (e.g., hit their head, bit their tongue)     no 10. OTHER SYMPTOMS: "Are there any other symptoms?" (e.g., fever, headache)  Protocols used: [facility]

## 2022-11-02 ENCOUNTER — Encounter: Payer: Self-pay | Admitting: Internal Medicine

## 2022-11-02 ENCOUNTER — Ambulatory Visit (INDEPENDENT_AMBULATORY_CARE_PROVIDER_SITE_OTHER): Payer: 59 | Admitting: Internal Medicine

## 2022-11-02 VITALS — BP 122/78 | HR 104 | Ht 61.0 in | Wt 159.0 lb

## 2022-11-02 DIAGNOSIS — G40909 Epilepsy, unspecified, not intractable, without status epilepticus: Secondary | ICD-10-CM

## 2022-11-02 DIAGNOSIS — G43009 Migraine without aura, not intractable, without status migrainosus: Secondary | ICD-10-CM

## 2022-11-02 DIAGNOSIS — F445 Conversion disorder with seizures or convulsions: Secondary | ICD-10-CM | POA: Insufficient documentation

## 2022-11-02 MED ORDER — LEVETIRACETAM 500 MG PO TABS
500.0000 mg | ORAL_TABLET | Freq: Two times a day (BID) | ORAL | 0 refills | Status: DC
Start: 2022-11-02 — End: 2022-11-19

## 2022-11-02 MED ORDER — PROMETHAZINE HCL 25 MG PO TABS: 25.0000 mg | ORAL_TABLET | Freq: Two times a day (BID) | ORAL | 0 refills | Status: DC | PRN

## 2022-11-02 NOTE — Assessment & Plan Note (Signed)
Will start Keppra 500 mg bid MRI Brain Refer to Neurology Pt advised not to drive.

## 2022-11-02 NOTE — Progress Notes (Signed)
Date:  11/02/2022   Name:  Crystal Rich   DOB:  May 02, 1966   MRN:  [id number]   Chief Complaint: Seizures (Patient here with her husband today. Patient c/o of having 5 episodes in the last year of seizure-like activity. Her last episode was Saturday. EMS was called but patient did not go to the ER. Patient has jerking movements, and was unconscious. The episode lasted 29 minutes but patient said she felt "out of it" for at least an hour.)  Seizures  This is a new problem. Episode onset: April 2024. The problem has not changed since onset.There were 4 to 5 seizures. The most recent episode lasted More than 5 minutes. Pertinent negatives include no chest pain. Characteristics include eye deviation, rhythmic jerking and loss of consciousness. Characteristics do not include bowel incontinence, bladder incontinence or bit tongue. The episode was Witnessed. There was No sensation of an aura present. The seizure(s) had no focality.  She has not aura and after the seizure resolves, she has only mild headache and fatigue.   Lab Results  Component Value Date   NA 138 10/16/2022   K 4.1 10/16/2022   CO2 23 10/16/2022   GLUCOSE 247 (H) 10/16/2022   BUN 8 10/16/2022   CREATININE 0.71 10/16/2022   CALCIUM 9.4 10/16/2022   EGFR 100 10/16/2022   GFRNONAA 85 08/17/2019   Lab Results  Component Value Date   CHOL 151 07/20/2022   HDL 32 (L) 07/20/2022   LDLCALC 73 07/20/2022   TRIG 281 (H) 07/20/2022   CHOLHDL 4.7 (H) 07/20/2022   Lab Results  Component Value Date   TSH 4.690 (H) 01/05/2021   Lab Results  Component Value Date   HGBA1C 9.5 (H) 10/16/2022   Lab Results  Component Value Date   WBC 7.7 07/20/2022   HGB 12.2 07/20/2022   HCT 37.6 07/20/2022   MCV 85 07/20/2022   PLT 252 07/20/2022   Lab Results  Component Value Date   ALT 36 (H) 10/16/2022   AST 46 (H) 10/16/2022   ALKPHOS 120 10/16/2022   BILITOT <0.2 10/16/2022   No results found for: "25OHVITD2",  "25OHVITD3", "VD25OH"   Review of Systems  Constitutional:  Negative for chills and fever.  Respiratory:  Negative for chest tightness, shortness of breath and wheezing.   Cardiovascular:  Negative for chest pain and palpitations.  Gastrointestinal:  Negative for bowel incontinence.  Genitourinary:  Negative for bladder incontinence.  Neurological:  Positive for seizures and loss of consciousness.  Psychiatric/Behavioral:  Negative for dysphoric mood and sleep disturbance. The patient is not nervous/anxious.     Patient Active Problem List   Diagnosis Date Noted   Seizure disorder (HCC) 11/02/2022   Hyperlipidemia associated with type 2 diabetes mellitus (HCC) 01/09/2022   Status post total hysterectomy and bilateral salpingo-oophorectomy 03/01/2020   Gastroesophageal reflux disease without esophagitis 12/24/2017   History of Clostridium difficile colitis 04/04/2017   Restless legs syndrome 12/31/2016   Essential hypertension 12/31/2016   Lymphadenopathy, axillary 04/25/2015   Type II diabetes mellitus with complication (HCC) 04/25/2015   Insomnia 03/11/2015   Chronic abdominal pain 11/17/2014   Anxiety, generalized 11/17/2014   Depression, major, recurrent, 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/2014   Migraine without aura and responsive to treatment 11/17/2014   Asthma, moderate persistent 11/17/2014    Allergies  Allergen Reactions   Macrobid [Nitrofurantoin] Other (See Comments)    "Patient could not walk"   Nsaids Shortness Of Breath   Doxycycline Itching  Januvia [Sitagliptin] Nausea And Vomiting   Sulfa Antibiotics Rash   Aspirin    Ketorolac     Other reaction(s): Other (See Comments) Patient states Burning sensation inside Other reaction(s): Other (See Comments) Patient states Burning sensation inside   Morphine Sulfate Nausea And Vomiting   Nalbuphine     Other reaction(s): Other (See Comments) Burning on inside   Prochlorperazine Edisylate Other (See Comments)     tachycardia   Trazodone Cough   Bupropion Rash   Oxybutynin Rash    Past Surgical History:  Procedure Laterality Date   APPENDECTOMY     BILATERAL SALPINGOOPHORECTOMY  05/07/2008   CHOLECYSTECTOMY  05/08/2003   COLONOSCOPY  05/07/2014   TOTAL ABDOMINAL HYSTERECTOMY  1999    Social History   Tobacco Use   Smoking status: Never   Smokeless tobacco: Never  Substance Use Topics   Alcohol use: No    Alcohol/week: 0.0 standard drinks of alcohol   Drug use: No     Medication list has been reviewed and updated.  Current Meds  Medication Sig   albuterol (VENTOLIN HFA) 108 (90 Base) MCG/ACT inhaler Inhale 2 puffs into the lungs every 4 (four) hours as needed for wheezing or shortness of breath.   clonazePAM (KLONOPIN) 1 MG tablet 2 (two) times daily as needed.    COMFORT EZ PEN NEEDLES 31G X 5 MM MISC USE TWO PEN NEEDLES DAILY   Continuous Glucose Sensor (DEXCOM G7 SENSOR) MISC 1 each by Does not apply route daily.   cyclobenzaprine (FLEXERIL) 10 MG tablet TAKE 1 TABLET BY MOUTH 3 TIMES DAILY   escitalopram (LEXAPRO) 20 MG tablet Take 40 mg by mouth daily.   fluticasone (FLONASE) 50 MCG/ACT nasal spray Place 2 sprays into both nostrils daily.   hydrOXYzine (ATARAX/VISTARIL) 25 MG tablet Take 2 tablets by mouth every 8 (eight) hours as needed.    Insulin Glargine (BASAGLAR KWIKPEN) 100 UNIT/ML 30 units every AM and 15 units every PM   isosorbide dinitrate (ISORDIL) 10 MG tablet Take 10 mg by mouth 2 (two) times daily.   levETIRAcetam (KEPPRA) 500 MG tablet Take 1 tablet (500 mg total) by mouth 2 (two) times daily.   lidocaine (LIDODERM) 5 % 1 patch daily.   liraglutide (VICTOZA) 18 MG/3ML SOPN INJECT 1.8mg  INTO THE SKIN DAILY AT 6am   losartan (COZAAR) 50 MG tablet TAKE 1 TABLET BY MOUTH DAILY   metFORMIN (GLUCOPHAGE) 500 MG tablet TAKE 3 TABLETS BY MOUTH DAILY WITH FOOD   mometasone (ASMANEX, 60 METERED DOSES,) 220 MCG/ACT inhaler Inhale 2 puffs into the lungs daily.    montelukast (SINGULAIR) 10 MG tablet TAKE 1 TABLET BY MOUTH AT BEDTIME   pantoprazole (PROTONIX) 40 MG tablet TAKE 1 TABLET BY MOUTH TWICE DAILY   rizatriptan (MAXALT) 10 MG tablet TAKE 1 TABLET BY MOUTH AS NEEDED FOR MIGRAINE. MAY REPEAT IN 2 HOURS IF NEEDED   rOPINIRole (REQUIP) 1 MG tablet TAKE 3 TABLETS BY MOUTH AT BEDTIME   rosuvastatin (CRESTOR) 5 MG tablet Take 1 tablet (5 mg total) by mouth 3 (three) times a week. Monday, Wednesday, Friday   [DISCONTINUED] promethazine (PHENERGAN) 25 MG tablet Take 1 tablet (25 mg total) by mouth 2 (two) times daily as needed.       11/02/2022    9:08 AM 10/24/2022    1:57 PM 09/04/2022    2:22 PM 07/20/2022    2:25 PM  GAD 7 : Generalized Anxiety Score  Nervous, Anxious, on Edge 1 1 1  1  Control/stop worrying 1 1 1 1   Worry too much - different things 1 1 1 1   Trouble relaxing 1 0 1 1  Restless 1 0 0 1  Easily annoyed or irritable 3 1 1 1   Afraid - awful might happen 0 0 0 0  Total GAD 7 Score 8 4 5 6   Anxiety Difficulty Somewhat difficult Somewhat difficult Somewhat difficult Somewhat difficult       11/02/2022    9:08 AM 10/24/2022    1:57 PM 09/04/2022    2:22 PM  Depression screen PHQ 2/9  Decreased Interest 2 1 1   Down, Depressed, Hopeless 2 1 1   PHQ - 2 Score 4 2 2   Altered sleeping 3 1 1   Tired, decreased energy 3 1 1   Change in appetite 3 0 0  Feeling bad or failure about yourself  1 0 0  Trouble concentrating 2 0 1  Moving slowly or fidgety/restless 1 0 0  Suicidal thoughts 0 0 0  PHQ-9 Score 17 4 5   Difficult doing work/chores Somewhat difficult Not difficult at all Not difficult at all    BP Readings from Last 3 Encounters:  11/02/22 122/78  10/24/22 118/76  09/04/22 128/78    Physical Exam Vitals and nursing note reviewed.  Constitutional:      General: She is not in acute distress.    Appearance: Normal appearance. She is well-developed.  HENT:     Head: Normocephalic and atraumatic.  Cardiovascular:     Rate  and Rhythm: Normal rate and regular rhythm.  Pulmonary:     Effort: Pulmonary effort is normal. No respiratory distress.     Breath sounds: No wheezing or rhonchi.  Musculoskeletal:     Cervical back: Normal range of motion.  Skin:    General: Skin is warm and dry.     Findings: No rash.  Neurological:     General: No focal deficit present.     Mental Status: She is alert and oriented to person, place, and time.  Psychiatric:        Mood and Affect: Mood normal.        Behavior: Behavior normal.     Wt Readings from Last 3 Encounters:  11/02/22 159 lb (72.1 kg)  10/24/22 165 lb (74.8 kg)  09/04/22 160 lb (72.6 kg)    BP 122/78   Pulse (!) 104   Ht 5\' 1"  (1.549 m)   Wt 159 lb (72.1 kg)   SpO2 96%   BMI 30.04 kg/m   Assessment and Plan:  Problem List Items Addressed This Visit     Seizure disorder (HCC) - Primary    Will start Keppra 500 mg bid MRI Brain Refer to Neurology Pt advised not to drive.      Relevant Medications   levETIRAcetam (KEPPRA) 500 MG tablet   Other Relevant Orders   Ambulatory referral to Neurology   Migraine without aura and responsive to treatment (Chronic)   Relevant Medications   levETIRAcetam (KEPPRA) 500 MG tablet   promethazine (PHENERGAN) 25 MG tablet    No follow-ups on file.   Partially dictated using Dragon software, any errors are not intentional.  Reubin Milan, MD [facility] [place], [place]

## 2022-11-06 ENCOUNTER — Telehealth: Payer: Self-pay | Admitting: Internal Medicine

## 2022-11-06 NOTE — Telephone Encounter (Signed)
Copied from CRM [id number]. Topic: Referral - Status >> Nov 06, 2022 10:54 AM Macon Large wrote: Reason for CRM: Pt reports that she needs the imaging referral to be sent to [facility] Radiology.

## 2022-11-06 NOTE — Telephone Encounter (Signed)
Copied from CRM [id number]. Topic: General - Inquiry >> Nov 06, 2022 10:09 AM De Blanch wrote: Reason for CRM: Angelique Blonder from [facility] stated she has an order for the patient to have a brain MRI; however, she is unsure if it should be with or without contrast.  Authorization is for with and without.  Please advise.

## 2022-11-06 NOTE — Telephone Encounter (Signed)
Called spoke to [place] told her its supposed to be with contrast. She verbalized understanding.   KP

## 2022-11-07 ENCOUNTER — Telehealth: Payer: Self-pay | Admitting: Internal Medicine

## 2022-11-07 DIAGNOSIS — G8929 Other chronic pain: Secondary | ICD-10-CM | POA: Diagnosis not present

## 2022-11-07 DIAGNOSIS — Z79899 Other long term (current) drug therapy: Secondary | ICD-10-CM | POA: Diagnosis not present

## 2022-11-07 DIAGNOSIS — F332 Major depressive disorder, recurrent severe without psychotic features: Secondary | ICD-10-CM | POA: Diagnosis not present

## 2022-11-07 DIAGNOSIS — F411 Generalized anxiety disorder: Secondary | ICD-10-CM | POA: Diagnosis not present

## 2022-11-07 DIAGNOSIS — G4700 Insomnia, unspecified: Secondary | ICD-10-CM | POA: Diagnosis not present

## 2022-11-07 NOTE — Telephone Encounter (Signed)
Copied from CRM [id number]. Topic: Referral - Status >> Nov 07, 2022  9:44 AM Epimenio Foot F wrote: Reason for CRM: Pt is calling in requesting the status on her referral that was sent to a neurologist. Pt says she hasn't been contacted and is seeking a number of the referred office so she can reach out to them. Pt is requesting a call back with the information. Please advise.

## 2022-11-07 NOTE — Telephone Encounter (Signed)
Called and left patient VM informing she was referred to [facility] Neurology. - Ciena Sampley

## 2022-11-09 DIAGNOSIS — F329 Major depressive disorder, single episode, unspecified: Secondary | ICD-10-CM | POA: Diagnosis not present

## 2022-11-09 DIAGNOSIS — G894 Chronic pain syndrome: Secondary | ICD-10-CM | POA: Diagnosis not present

## 2022-11-09 DIAGNOSIS — I10 Essential (primary) hypertension: Secondary | ICD-10-CM | POA: Diagnosis not present

## 2022-11-09 DIAGNOSIS — G43109 Migraine with aura, not intractable, without status migrainosus: Secondary | ICD-10-CM | POA: Diagnosis not present

## 2022-11-09 DIAGNOSIS — R1031 Right lower quadrant pain: Secondary | ICD-10-CM | POA: Diagnosis not present

## 2022-11-09 DIAGNOSIS — G2581 Restless legs syndrome: Secondary | ICD-10-CM | POA: Diagnosis not present

## 2022-11-09 DIAGNOSIS — Z794 Long term (current) use of insulin: Secondary | ICD-10-CM | POA: Diagnosis not present

## 2022-11-09 DIAGNOSIS — C220 Liver cell carcinoma: Secondary | ICD-10-CM | POA: Diagnosis not present

## 2022-11-09 DIAGNOSIS — F445 Conversion disorder with seizures or convulsions: Secondary | ICD-10-CM | POA: Diagnosis not present

## 2022-11-09 DIAGNOSIS — R519 Headache, unspecified: Secondary | ICD-10-CM | POA: Diagnosis not present

## 2022-11-09 DIAGNOSIS — J4540 Moderate persistent asthma, uncomplicated: Secondary | ICD-10-CM | POA: Diagnosis not present

## 2022-11-09 DIAGNOSIS — R404 Transient alteration of awareness: Secondary | ICD-10-CM | POA: Diagnosis not present

## 2022-11-09 DIAGNOSIS — E119 Type 2 diabetes mellitus without complications: Secondary | ICD-10-CM | POA: Diagnosis not present

## 2022-11-09 DIAGNOSIS — R569 Unspecified convulsions: Secondary | ICD-10-CM | POA: Diagnosis not present

## 2022-11-09 DIAGNOSIS — G43009 Migraine without aura, not intractable, without status migrainosus: Secondary | ICD-10-CM | POA: Diagnosis not present

## 2022-11-09 LAB — HEMOGLOBIN A1C: Hemoglobin A1C: 9

## 2022-11-10 DIAGNOSIS — R404 Transient alteration of awareness: Secondary | ICD-10-CM | POA: Diagnosis not present

## 2022-11-10 DIAGNOSIS — I10 Essential (primary) hypertension: Secondary | ICD-10-CM | POA: Diagnosis not present

## 2022-11-10 DIAGNOSIS — R569 Unspecified convulsions: Secondary | ICD-10-CM | POA: Diagnosis not present

## 2022-11-11 DIAGNOSIS — I10 Essential (primary) hypertension: Secondary | ICD-10-CM | POA: Diagnosis not present

## 2022-11-11 DIAGNOSIS — R569 Unspecified convulsions: Secondary | ICD-10-CM | POA: Diagnosis not present

## 2022-11-11 DIAGNOSIS — F445 Conversion disorder with seizures or convulsions: Secondary | ICD-10-CM | POA: Diagnosis not present

## 2022-11-12 ENCOUNTER — Telehealth: Payer: Self-pay | Admitting: *Deleted

## 2022-11-12 ENCOUNTER — Other Ambulatory Visit: Payer: Self-pay | Admitting: Internal Medicine

## 2022-11-12 DIAGNOSIS — M62830 Muscle spasm of back: Secondary | ICD-10-CM

## 2022-11-13 ENCOUNTER — Telehealth: Payer: Self-pay | Admitting: *Deleted

## 2022-11-13 NOTE — Transitions of Care (Post Inpatient/ED Visit) (Signed)
11/13/2022  Name: Crystal Rich MRN: [id number] DOB: Oct 12, 1965  Today's TOC FU Call Status: Today's TOC FU Call Status:: Successful TOC FU Call Competed TOC FU Call Complete Date: 11/12/22  Transition Care Management Follow-up Telephone Call Date of Discharge: 11/11/22 Discharge Facility: Other ([facility]) Name of Other ([facility]) Discharge Facility: [facility] Type of Discharge: Inpatient Admission Primary Inpatient Discharge Diagnosis:: seizure like activity How have you been since you were released from the hospital?: Better Any questions or concerns?: No  Items Reviewed: Did you receive and understand the discharge instructions provided?: Yes Medications obtained,verified, and reconciled?: Yes (Medications Reviewed) Any new allergies since your discharge?: No Dietary orders reviewed?: No Do you have support at home?: Yes People in Home: spouse Name of Support/Comfort Primary Source: [name]  Medications Reviewed Today: Medications Reviewed Today     Reviewed by [name], RN (Case Manager) on 11/12/22 at 1634  Med List Status: <None>   Medication Order Taking? Sig Documenting Provider Last Dose Status Informant  albuterol (VENTOLIN HFA) 108 (90 Base) MCG/ACT inhaler [id number] Yes Inhale 2 puffs into the lungs every 4 (four) hours as needed for wheezing or shortness of breath. Reubin Milan, MD Taking Active   clonazePAM Scarlette Calico) 1 MG tablet [id number] Yes 2 (two) times daily as needed.  [provider] Taking Active            Med Note Osa Craver   Fri Nov 15, 2017  1:31 PM)    COMFORT EZ PEN NEEDLES 31G X 5 MM MISC [id number]  USE TWO PEN NEEDLES DAILY Reubin Milan, MD  Active   Continuous Glucose Sensor (DEXCOM G7 SENSOR) MISC [id number]  1 each by Does not apply route daily. Reubin Milan, MD  Active   cyclobenzaprine (FLEXERIL) 10 MG tablet [id number] Yes TAKE 1 TABLET BY MOUTH 3 TIMES DAILY Reubin Milan, MD Taking Active    escitalopram (LEXAPRO) 20 MG tablet [id number] Yes Take 40 mg by mouth daily. [provider] Taking Active   fluticasone (FLONASE) 50 MCG/ACT nasal spray [id number] Yes Place 2 sprays into both nostrils daily. Reubin Milan, MD Taking Active   hydrOXYzine (ATARAX/VISTARIL) 25 MG tablet [id number] Yes Take 2 tablets by mouth every 8 (eight) hours as needed.  [provider] Taking Active            Med Note Osa Craver   Fri Nov 15, 2017  1:34 PM)    Insulin Glargine [facility]) 100 UNIT/ML [id number] Yes 30 units every AM and 15 units every PM Reubin Milan, MD Taking Active   isosorbide dinitrate (ISORDIL) 10 MG tablet [id number] Yes Take 10 mg by mouth 2 (two) times daily. [provider] Taking Active   levETIRAcetam (KEPPRA) 500 MG tablet [id number] Yes Take 1 tablet (500 mg total) by mouth 2 (two) times daily. Reubin Milan, MD Taking Active   lidocaine (LIDODERM) 5 % [id number] Yes 1 patch daily. [provider] Taking Active   liraglutide (VICTOZA) 18 MG/3ML SOPN [id number] Yes INJECT 1.8mg  INTO THE SKIN DAILY AT [name], [name], MD Taking Active   losartan (COZAAR) 50 MG tablet [id number] Yes TAKE 1 TABLET BY MOUTH DAILY Reubin Milan, MD Taking Active   metFORMIN (GLUCOPHAGE) 500 MG tablet [id number] Yes TAKE 3 TABLETS BY MOUTH DAILY WITH FOOD Reubin Milan, MD Taking Active   mometasone [facility], 60 METERED DOSES,) 220 MCG/ACT inhaler [id number] Yes Inhale 2 puffs into the lungs  daily. Reubin Milan, MD Taking Active   montelukast (SINGULAIR) 10 MG tablet [id number] Yes TAKE 1 TABLET BY MOUTH AT BEDTIME Reubin Milan, MD Taking Active   pantoprazole (PROTONIX) 40 MG tablet [id number] Yes TAKE 1 TABLET BY MOUTH TWICE DAILY Reubin Milan, MD Taking Active   promethazine (PHENERGAN) 25 MG tablet [id number] Yes Take 1 tablet (25 mg total) by mouth 2 (two) times daily as needed. Reubin Milan, MD Taking Active    rizatriptan (MAXALT) 10 MG tablet [id number] Yes TAKE 1 TABLET BY MOUTH AS NEEDED FOR MIGRAINE. MAY REPEAT IN 2 HOURS IF NEEDED Reubin Milan, MD Taking Active   rOPINIRole (REQUIP) 1 MG tablet [id number] Yes TAKE 3 TABLETS BY MOUTH AT BEDTIME Reubin Milan, MD Taking Active   rosuvastatin (CRESTOR) 5 MG tablet [id number] Yes Take 1 tablet (5 mg total) by mouth 3 (three) times a week. Monday, Wednesday, Friday Reubin Milan, MD Taking Active             Home Care and Equipment/Supplies: Were Home Health Services Ordered?: NA Any new equipment or medical supplies ordered?: NA  Functional Questionnaire: Do you need assistance with bathing/showering or dressing?: No Do you need assistance with meal preparation?: No Do you need assistance with eating?: No Do you have difficulty maintaining continence: No Do you need assistance with getting out of bed/getting out of a chair/moving?: No Do you have difficulty managing or taking your medications?: No  Follow up appointments reviewed:    SDOH Interventions Today    Flowsheet Row Most Recent Value  SDOH Interventions   Food Insecurity Interventions Intervention Not Indicated  Housing Interventions Intervention Not Indicated  Transportation Interventions Intervention Not Indicated      Interventions Today    Flowsheet Row Most Recent Value  General Interventions   General Interventions Discussed/Reviewed General Interventions Discussed, General Interventions Reviewed, Doctor Visits  Doctor Visits Discussed/Reviewed Doctor Visits Discussed, Doctor Visits Reviewed  Mental Health Interventions   Mental Health Discussed/Reviewed Mental Health Discussed  [Discussed counseling and the benefits. Patient is going to a therapist]  Pharmacy Interventions   Pharmacy Dicussed/Reviewed Pharmacy Topics Discussed      TOC Interventions Today    Flowsheet Row Most Recent Value  TOC Interventions   TOC Interventions  Discussed/Reviewed TOC Interventions Discussed, TOC Interventions Reviewed, Arranged PCP follow up less than 12 days/Care Guide scheduled        Gean Maidens BSN RN [facility] [phone]

## 2022-11-13 NOTE — Progress Notes (Signed)
  Care Coordination  Note  11/13/2022 Name: AILENA PASSERO MRN: [id number] DOB: 09/27/1965  MARY-ANNE FENDT is a 57 y.o. year old primary care patient of Reubin Milan, MD. I reached out to Sharyon Cable by phone today to assist with scheduling a follow up appointment. Janeth Rase Sardinas verbally consented to my assistance.       Follow up plan: Hospital Follow Up appointment scheduled with [name], [name], MD) on (11/19/22) at (2:00 PM).  [facility]  Care Coordination Care Guide  Direct Dial: [phone]

## 2022-11-16 ENCOUNTER — Encounter: Payer: Self-pay | Admitting: Internal Medicine

## 2022-11-19 ENCOUNTER — Encounter: Payer: Self-pay | Admitting: Internal Medicine

## 2022-11-19 ENCOUNTER — Ambulatory Visit (INDEPENDENT_AMBULATORY_CARE_PROVIDER_SITE_OTHER): Payer: 59 | Admitting: Internal Medicine

## 2022-11-19 VITALS — BP 124/60 | HR 111 | Ht 61.0 in | Wt 156.0 lb

## 2022-11-19 DIAGNOSIS — F445 Conversion disorder with seizures or convulsions: Secondary | ICD-10-CM | POA: Diagnosis not present

## 2022-11-19 DIAGNOSIS — E118 Type 2 diabetes mellitus with unspecified complications: Secondary | ICD-10-CM | POA: Diagnosis not present

## 2022-11-19 DIAGNOSIS — J0100 Acute maxillary sinusitis, unspecified: Secondary | ICD-10-CM | POA: Diagnosis not present

## 2022-11-19 MED ORDER — AMOXICILLIN 875 MG PO TABS
875.0000 mg | ORAL_TABLET | Freq: Two times a day (BID) | ORAL | 0 refills | Status: AC
Start: 2022-11-19 — End: 2022-11-29

## 2022-11-19 MED ORDER — PROMETHAZINE-DM 6.25-15 MG/5ML PO SYRP
5.0000 mL | ORAL_SOLUTION | Freq: Every day | ORAL | 0 refills | Status: AC
Start: 2022-11-19 — End: 2022-12-13

## 2022-11-19 NOTE — Assessment & Plan Note (Addendum)
Now on Metformin and Victoza Held insulin since hospital discharge - needs to resume to continue to get A1C improvement Resume insulin Recent A1C 9.0

## 2022-11-19 NOTE — Progress Notes (Signed)
Date:  11/19/2022   Name:  Crystal Rich   DOB:  1965-10-12   MRN:  [id number]   Chief Complaint: Hospitalization Follow-up (Seizure-like-activity. ) Admitted to [facility] for seizures 11/09/22 to 11/11/22.  TOC call done on 11/12/22.  Sinus Problem This is a new problem. The current episode started in the past 7 days. The problem has been gradually worsening since onset. There has been no fever. Associated symptoms include coughing, a hoarse voice, sinus pressure and a sore throat. Pertinent negatives include no headaches or shortness of breath. Past treatments include oral decongestants and nasal decongestants. The treatment provided no relief.   Admission Diagnoses:  Seizure (CMS/HHS-HCC) [R56.9]  Discharge Diagnoses:  Principal Problem: Seizure-like activity (CMS/HHS-HCC) Active Problems: Chronic pain syndrome Type 2 diabetes mellitus without complication, without long-term current use of insulin (CMS/HHS-HCC) Asthma, moderate persistent (HHS-HCC) Depression, major, recurrent, in partial remission (CMS-HCC) Essential hypertension Gastroesophageal reflux disease without esophagitis Migraine without aura and responsive to treatment Restless legs syndrome Psychogenic nonepileptic seizure Resolved Problems: * No resolved hospital problems. * Primary Diagnosis: Admitted for seizure-like activity  Changes Made:  - Stopped home Keppra due to EEG confirming non-epileptic seizure-like activity  Anticipatory Guidance for Outpatient Provider:  - Patient is established with psychiatry outpatient for anxiety and depression and prefers to continue care with this provider with new PNES diagnosis  Recommended Follow-up Studies:  None.    Results Pending at Discharge:  None Please see phone numbers at end of this summary for lab contact information.   Follow-up/Care Transition Plan: Future Appointments  Date Time Provider Department Center  01/16/2023 11:00 AM Ponder, Eliseo Gum, MD [facility]  02/21/2023 1:00 PM Tamera Punt, MD [facility]   Lab Results  Component Value Date   NA 138 10/16/2022   K 4.1 10/16/2022   CO2 23 10/16/2022   GLUCOSE 247 (H) 10/16/2022   BUN 8 10/16/2022   CREATININE 0.71 10/16/2022   CALCIUM 9.4 10/16/2022   EGFR 100 10/16/2022   GFRNONAA 85 08/17/2019   Lab Results  Component Value Date   CHOL 151 07/20/2022   HDL 32 (L) 07/20/2022   LDLCALC 73 07/20/2022   TRIG 281 (H) 07/20/2022   CHOLHDL 4.7 (H) 07/20/2022   Lab Results  Component Value Date   TSH 4.690 (H) 01/05/2021   Lab Results  Component Value Date   HGBA1C 9 11/09/2022   Lab Results  Component Value Date   WBC 7.7 07/20/2022   HGB 12.2 07/20/2022   HCT 37.6 07/20/2022   MCV 85 07/20/2022   PLT 252 07/20/2022   Lab Results  Component Value Date   ALT 36 (H) 10/16/2022   AST 46 (H) 10/16/2022   ALKPHOS 120 10/16/2022   BILITOT <0.2 10/16/2022   No results found for: "25OHVITD2", "25OHVITD3", "VD25OH"   Review of Systems  Constitutional:  Negative for appetite change, fatigue, fever and unexpected weight change.  HENT:  Positive for hoarse voice, sinus pressure and sore throat. Negative for tinnitus and trouble swallowing.   Eyes:  Negative for visual disturbance.  Respiratory:  Positive for cough. Negative for chest tightness and shortness of breath.   Cardiovascular:  Negative for chest pain, palpitations and leg swelling.  Gastrointestinal:  Negative for abdominal pain.  Endocrine: Negative for polydipsia and polyuria.  Genitourinary:  Negative for dysuria and hematuria.  Musculoskeletal:  Positive for arthralgias and gait problem.  Neurological:  Negative for tremors, numbness and headaches.  Psychiatric/Behavioral:  Positive  for sleep disturbance. Negative for dysphoric mood. The patient is nervous/anxious.     Patient Active Problem List   Diagnosis Date Noted   Psychogenic nonepileptic seizure  11/02/2022   Hyperlipidemia associated with type 2 diabetes mellitus (HCC) 01/09/2022   Status post total hysterectomy and bilateral salpingo-oophorectomy 03/01/2020   Gastroesophageal reflux disease without esophagitis 12/24/2017   History of Clostridium difficile colitis 04/04/2017   Restless legs syndrome 12/31/2016   Essential hypertension 12/31/2016   Lymphadenopathy, axillary 04/25/2015   Type II diabetes mellitus with complication (HCC) 04/25/2015   Insomnia 03/11/2015   Chronic abdominal pain 11/17/2014   Anxiety, generalized 11/17/2014   Depression, major, recurrent, 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/2014   Migraine without aura and responsive to treatment 11/17/2014   Asthma, moderate persistent 11/17/2014    Allergies  Allergen Reactions   Macrobid [Nitrofurantoin] Other (See Comments)    "Patient could not walk"   Nsaids Shortness Of Breath   Doxycycline Itching   Januvia [Sitagliptin] Nausea And Vomiting   Sulfa Antibiotics Rash   Aspirin    Ketorolac     Other reaction(s): Other (See Comments) Patient states Burning sensation inside Other reaction(s): Other (See Comments) Patient states Burning sensation inside   Morphine Sulfate Nausea And Vomiting   Nalbuphine     Other reaction(s): Other (See Comments) Burning on inside   Prochlorperazine Edisylate Other (See Comments)    tachycardia   Trazodone Cough   Bupropion Rash   Oxybutynin Rash    Past Surgical History:  Procedure Laterality Date   APPENDECTOMY     BILATERAL SALPINGOOPHORECTOMY  05/07/2008   CHOLECYSTECTOMY  05/08/2003   COLONOSCOPY  05/07/2014   TOTAL ABDOMINAL HYSTERECTOMY  1999    Social History   Tobacco Use   Smoking status: Never   Smokeless tobacco: Never  Substance Use Topics   Alcohol use: No    Alcohol/week: 0.0 standard drinks of alcohol   Drug use: No     Medication list has been reviewed and updated.  Current Meds  Medication Sig   albuterol (VENTOLIN HFA) 108 (90  Base) MCG/ACT inhaler Inhale 2 puffs into the lungs every 4 (four) hours as needed for wheezing or shortness of breath.   amoxicillin (AMOXIL) 875 MG tablet Take 1 tablet (875 mg total) by mouth 2 (two) times daily for 10 days.   clonazePAM (KLONOPIN) 1 MG tablet 2 (two) times daily as needed.    COMFORT EZ PEN NEEDLES 31G X 5 MM MISC USE TWO PEN NEEDLES DAILY   Continuous Glucose Sensor (DEXCOM G7 SENSOR) MISC 1 each by Does not apply route daily.   cyclobenzaprine (FLEXERIL) 10 MG tablet TAKE 1 TABLET BY MOUTH 3 TIMES DAILY   escitalopram (LEXAPRO) 20 MG tablet Take 40 mg by mouth daily.   fluticasone (FLONASE) 50 MCG/ACT nasal spray Place 2 sprays into both nostrils daily.   hydrOXYzine (ATARAX/VISTARIL) 25 MG tablet Take 2 tablets by mouth every 8 (eight) hours as needed.    Insulin Glargine (BASAGLAR KWIKPEN) 100 UNIT/ML 30 units every AM and 15 units every PM   isosorbide dinitrate (ISORDIL) 10 MG tablet Take 10 mg by mouth 2 (two) times daily.   lidocaine (LIDODERM) 5 % 1 patch daily.   liraglutide (VICTOZA) 18 MG/3ML SOPN INJECT 1.8mg  INTO THE SKIN DAILY AT 6am   losartan (COZAAR) 50 MG tablet TAKE 1 TABLET BY MOUTH DAILY   metFORMIN (GLUCOPHAGE) 500 MG tablet TAKE 3 TABLETS BY MOUTH DAILY WITH FOOD   mometasone (  ASMANEX, 60 METERED DOSES,) 220 MCG/ACT inhaler Inhale 2 puffs into the lungs daily.   montelukast (SINGULAIR) 10 MG tablet TAKE 1 TABLET BY MOUTH AT BEDTIME   pantoprazole (PROTONIX) 40 MG tablet TAKE 1 TABLET BY MOUTH TWICE DAILY   promethazine (PHENERGAN) 25 MG tablet Take 1 tablet (25 mg total) by mouth 2 (two) times daily as needed.   promethazine-dextromethorphan (PROMETHAZINE-DM) 6.25-15 MG/5ML syrup Take 5 mLs by mouth at bedtime for 24 days.   rizatriptan (MAXALT) 10 MG tablet TAKE 1 TABLET BY MOUTH AS NEEDED FOR MIGRAINE. MAY REPEAT IN 2 HOURS IF NEEDED   rOPINIRole (REQUIP) 1 MG tablet TAKE 3 TABLETS BY MOUTH AT BEDTIME   rosuvastatin (CRESTOR) 5 MG tablet Take 1  tablet (5 mg total) by mouth 3 (three) times a week. Monday, Wednesday, Friday   [DISCONTINUED] hydrOXYzine (VISTARIL) 25 MG capsule Take 25 mg by mouth every 6 (six) hours as needed.       11/19/2022    2:05 PM 11/02/2022    9:08 AM 10/24/2022    1:57 PM 09/04/2022    2:22 PM  GAD 7 : Generalized Anxiety Score  Nervous, Anxious, on Edge 1 1 1 1   Control/stop worrying 1 1 1 1   Worry too much - different things 1 1 1 1   Trouble relaxing 1 1 0 1  Restless 1 1 0 0  Easily annoyed or irritable 1 3 1 1   Afraid - awful might happen 1 0 0 0  Total GAD 7 Score 7 8 4 5   Anxiety Difficulty Somewhat difficult Somewhat difficult Somewhat difficult Somewhat difficult       11/19/2022    2:04 PM 11/02/2022    9:08 AM 10/24/2022    1:57 PM  Depression screen PHQ 2/9  Decreased Interest 2 2 1   Down, Depressed, Hopeless 2 2 1   PHQ - 2 Score 4 4 2   Altered sleeping 2 3 1   Tired, decreased energy 2 3 1   Change in appetite 2 3 0  Feeling bad or failure about yourself  2 1 0  Trouble concentrating 2 2 0  Moving slowly or fidgety/restless 1 1 0  Suicidal thoughts 0 0 0  PHQ-9 Score 15 17 4   Difficult doing work/chores  Somewhat difficult Not difficult at all    BP Readings from Last 3 Encounters:  11/19/22 124/60  11/02/22 122/78  10/24/22 118/76    Physical Exam Vitals and nursing note reviewed.  Constitutional:      General: She is not in acute distress.    Appearance: She is well-developed.  HENT:     Head: Normocephalic and atraumatic.     Right Ear: Tympanic membrane normal.     Left Ear: Tympanic membrane normal.     Nose:     Right Sinus: Maxillary sinus tenderness and frontal sinus tenderness present.     Left Sinus: Maxillary sinus tenderness and frontal sinus tenderness present.     Mouth/Throat:     Mouth: Mucous membranes are dry.     Pharynx: Oropharynx is clear.  Cardiovascular:     Rate and Rhythm: Normal rate and regular rhythm.     Heart sounds: No murmur  heard. Pulmonary:     Effort: Pulmonary effort is normal. No respiratory distress.  Musculoskeletal:     Cervical back: Normal range of motion.     Right lower leg: No edema.     Left lower leg: No edema.  Lymphadenopathy:     Cervical:  No cervical adenopathy.  Skin:    General: Skin is warm and dry.     Findings: No rash.  Neurological:     General: No focal deficit present.     Mental Status: She is alert and oriented to person, place, and time.  Psychiatric:        Mood and Affect: Mood normal.        Behavior: Behavior normal.     Wt Readings from Last 3 Encounters:  11/19/22 156 lb (70.8 kg)  11/02/22 159 lb (72.1 kg)  10/24/22 165 lb (74.8 kg)    BP 124/60   Pulse (!) 111   Ht 5\' 1"  (1.549 m)   Wt 156 lb (70.8 kg)   SpO2 98%   BMI 29.48 kg/m   Assessment and Plan:  Problem List Items Addressed This Visit     Type II diabetes mellitus with complication (HCC) (Chronic)    Now on Metformin and Victoza Held insulin since hospital discharge - needs to resume to continue to get A1C improvement Resume insulin Recent A1C 9.0      Psychogenic nonepileptic seizure - Primary    No further episodes since discharge She will continue to work on stress reduction Discuss findings with Psych at next visit in 3 weeks      Other Visit Diagnoses     Acute non-recurrent maxillary sinusitis       Relevant Medications   amoxicillin (AMOXIL) 875 MG tablet   promethazine-dextromethorphan (PROMETHAZINE-DM) 6.25-15 MG/5ML syrup      Follow up in 2 months.  No follow-ups on file.    Reubin Milan, MD [facility] [place]

## 2022-11-19 NOTE — Assessment & Plan Note (Signed)
No further episodes since discharge She will continue to work on stress reduction Discuss findings with Psych at next visit in 3 weeks

## 2022-11-21 ENCOUNTER — Ambulatory Visit: Payer: Self-pay | Admitting: *Deleted

## 2022-11-21 ENCOUNTER — Telehealth: Payer: 59 | Admitting: Internal Medicine

## 2022-11-21 NOTE — Telephone Encounter (Signed)
Patient informed. Verbalized understanding. - Crystal Rich

## 2022-11-21 NOTE — Telephone Encounter (Signed)
Please advise 

## 2022-11-21 NOTE — Telephone Encounter (Signed)
  Chief Complaint: elevated blood glucose greater than 300 Symptoms:  fasting glucose this am 250. Now 308 and took prescribed basaglar 30 units . Headache, dizziness, blurred vision, nausea Frequency: today  Pertinent Negatives: Patient denies chest pain no difficulty breathing no vomiting  Disposition: [] ED /[] Urgent Care (no appt availability in office) / [x] Appointment(In office/virtual)/ []  [place] [facility]/ [] Home Care/ [] Refused Recommended Disposition /[] [place] [facility]/ []  Follow-up with PCP Additional Notes:   My Chart VV scheduled .    Reason for Disposition  [1] Blood glucose > 300 mg/dL (16.1 mmol/L) AND [0] uses insulin (e.g., insulin-dependent, all people with type 1 diabetes)  Answer Assessment - Initial Assessment Questions 1. BLOOD GLUCOSE: "What is your blood glucose level?"      308 2. ONSET: "When did you check the blood glucose?"     Now  3. USUAL RANGE: "What is your glucose level usually?" (e.g., usual fasting morning value, usual evening value)     200's but not 250's  4. KETONES: "Do you check for ketones (urine or blood test strips)?" If Yes, ask: "What does the test show now?"      na 5. TYPE 1 or 2:  "Do you know what type of diabetes you have?"  (e.g., Type 1, Type 2, Gestational; doesn't know)      na 6. INSULIN: "Do you take insulin?" "What type of insulin(s) do you use? What is the mode of delivery? (syringe, pen; injection or pump)?"      Yes basaglar kwikpen, 30 units am and 15 units pm 7. DIABETES PILLS: "Do you take any pills for your diabetes?" If Yes, ask: "Have you missed taking any pills recently?"     Yes metformin 500 mg  8. OTHER SYMPTOMS: "Do you have any symptoms?" (e.g., fever, frequent urination, difficulty breathing, dizziness, weakness, vomiting)     Headache, blurred vision, nausea dizziness  9. PREGNANCY: "Is there any chance you are pregnant?" "When was your last menstrual period?"    Na  Protocols used: Diabetes -  High Blood Sugar-A-AH

## 2022-11-22 NOTE — Telephone Encounter (Signed)
Pt called back regarding updated insulin dosage. Shared provider's note.  No questions.

## 2022-11-26 DIAGNOSIS — R768 Other specified abnormal immunological findings in serum: Secondary | ICD-10-CM | POA: Diagnosis not present

## 2022-11-26 DIAGNOSIS — E069 Thyroiditis, unspecified: Secondary | ICD-10-CM | POA: Diagnosis not present

## 2022-11-26 DIAGNOSIS — K909 Intestinal malabsorption, unspecified: Secondary | ICD-10-CM | POA: Diagnosis not present

## 2022-11-30 DIAGNOSIS — R569 Unspecified convulsions: Secondary | ICD-10-CM | POA: Diagnosis not present

## 2022-11-30 DIAGNOSIS — G43009 Migraine without aura, not intractable, without status migrainosus: Secondary | ICD-10-CM | POA: Diagnosis not present

## 2022-11-30 DIAGNOSIS — Z9049 Acquired absence of other specified parts of digestive tract: Secondary | ICD-10-CM | POA: Diagnosis not present

## 2022-11-30 DIAGNOSIS — G4700 Insomnia, unspecified: Secondary | ICD-10-CM | POA: Diagnosis not present

## 2022-11-30 DIAGNOSIS — R350 Frequency of micturition: Secondary | ICD-10-CM | POA: Diagnosis not present

## 2022-11-30 DIAGNOSIS — R519 Headache, unspecified: Secondary | ICD-10-CM | POA: Diagnosis not present

## 2022-11-30 DIAGNOSIS — R1314 Dysphagia, pharyngoesophageal phase: Secondary | ICD-10-CM | POA: Diagnosis not present

## 2022-11-30 DIAGNOSIS — R7989 Other specified abnormal findings of blood chemistry: Secondary | ICD-10-CM | POA: Diagnosis not present

## 2022-11-30 DIAGNOSIS — R109 Unspecified abdominal pain: Secondary | ICD-10-CM | POA: Diagnosis not present

## 2022-11-30 DIAGNOSIS — Z7984 Long term (current) use of oral hypoglycemic drugs: Secondary | ICD-10-CM | POA: Diagnosis not present

## 2022-11-30 DIAGNOSIS — R1011 Right upper quadrant pain: Secondary | ICD-10-CM | POA: Diagnosis not present

## 2022-11-30 DIAGNOSIS — G2581 Restless legs syndrome: Secondary | ICD-10-CM | POA: Diagnosis not present

## 2022-11-30 DIAGNOSIS — C220 Liver cell carcinoma: Secondary | ICD-10-CM | POA: Diagnosis not present

## 2022-11-30 DIAGNOSIS — R10813 Right lower quadrant abdominal tenderness: Secondary | ICD-10-CM | POA: Diagnosis not present

## 2022-11-30 DIAGNOSIS — J019 Acute sinusitis, unspecified: Secondary | ICD-10-CM | POA: Diagnosis not present

## 2022-11-30 DIAGNOSIS — Z8619 Personal history of other infectious and parasitic diseases: Secondary | ICD-10-CM | POA: Diagnosis not present

## 2022-11-30 DIAGNOSIS — K224 Dyskinesia of esophagus: Secondary | ICD-10-CM | POA: Diagnosis not present

## 2022-11-30 DIAGNOSIS — F419 Anxiety disorder, unspecified: Secondary | ICD-10-CM | POA: Diagnosis not present

## 2022-11-30 DIAGNOSIS — F445 Conversion disorder with seizures or convulsions: Secondary | ICD-10-CM | POA: Diagnosis not present

## 2022-11-30 DIAGNOSIS — R7402 Elevation of levels of lactic acid dehydrogenase (LDH): Secondary | ICD-10-CM | POA: Diagnosis not present

## 2022-11-30 DIAGNOSIS — R1031 Right lower quadrant pain: Secondary | ICD-10-CM | POA: Diagnosis not present

## 2022-11-30 DIAGNOSIS — Z87891 Personal history of nicotine dependence: Secondary | ICD-10-CM | POA: Diagnosis not present

## 2022-11-30 DIAGNOSIS — Z7985 Long-term (current) use of injectable non-insulin antidiabetic drugs: Secondary | ICD-10-CM | POA: Diagnosis not present

## 2022-11-30 DIAGNOSIS — K6389 Other specified diseases of intestine: Secondary | ICD-10-CM | POA: Diagnosis not present

## 2022-11-30 DIAGNOSIS — K5900 Constipation, unspecified: Secondary | ICD-10-CM | POA: Diagnosis not present

## 2022-11-30 DIAGNOSIS — T360X5A Adverse effect of penicillins, initial encounter: Secondary | ICD-10-CM | POA: Diagnosis not present

## 2022-11-30 DIAGNOSIS — K760 Fatty (change of) liver, not elsewhere classified: Secondary | ICD-10-CM | POA: Diagnosis not present

## 2022-11-30 DIAGNOSIS — J4540 Moderate persistent asthma, uncomplicated: Secondary | ICD-10-CM | POA: Diagnosis not present

## 2022-11-30 DIAGNOSIS — K712 Toxic liver disease with acute hepatitis: Secondary | ICD-10-CM | POA: Diagnosis not present

## 2022-11-30 DIAGNOSIS — Z79899 Other long term (current) drug therapy: Secondary | ICD-10-CM | POA: Diagnosis not present

## 2022-11-30 DIAGNOSIS — K219 Gastro-esophageal reflux disease without esophagitis: Secondary | ICD-10-CM | POA: Diagnosis not present

## 2022-11-30 DIAGNOSIS — E119 Type 2 diabetes mellitus without complications: Secondary | ICD-10-CM | POA: Diagnosis not present

## 2022-12-01 DIAGNOSIS — R569 Unspecified convulsions: Secondary | ICD-10-CM | POA: Diagnosis not present

## 2022-12-01 DIAGNOSIS — F3341 Major depressive disorder, recurrent, in partial remission: Secondary | ICD-10-CM | POA: Diagnosis not present

## 2022-12-01 DIAGNOSIS — E119 Type 2 diabetes mellitus without complications: Secondary | ICD-10-CM | POA: Diagnosis not present

## 2022-12-01 DIAGNOSIS — C220 Liver cell carcinoma: Secondary | ICD-10-CM | POA: Diagnosis not present

## 2022-12-01 DIAGNOSIS — R1031 Right lower quadrant pain: Secondary | ICD-10-CM | POA: Diagnosis not present

## 2022-12-01 DIAGNOSIS — K760 Fatty (change of) liver, not elsewhere classified: Secondary | ICD-10-CM | POA: Diagnosis not present

## 2022-12-01 DIAGNOSIS — J4540 Moderate persistent asthma, uncomplicated: Secondary | ICD-10-CM | POA: Diagnosis not present

## 2022-12-02 DIAGNOSIS — K719 Toxic liver disease, unspecified: Secondary | ICD-10-CM | POA: Diagnosis not present

## 2022-12-02 DIAGNOSIS — R1031 Right lower quadrant pain: Secondary | ICD-10-CM | POA: Diagnosis not present

## 2022-12-04 ENCOUNTER — Telehealth: Payer: Self-pay | Admitting: *Deleted

## 2022-12-04 NOTE — Transitions of Care (Post Inpatient/ED Visit) (Signed)
12/04/2022  Name: Crystal Rich MRN: [id number] DOB: 01-12-1966  Today's TOC FU Call Status: Today's TOC FU Call Status:: Successful TOC FU Call Competed TOC FU Call Complete Date: 12/04/22  Transition Care Management Follow-up Telephone Call Date of Discharge: 12/04/22 Discharge Facility: Other ([facility]) [facility]: [facility] Type of Discharge: Inpatient Admission Primary Inpatient Discharge Diagnosis:: abd pain Any questions or concerns?: No  Items Reviewed: Did you receive and understand the discharge instructions provided?: Yes Medications obtained,verified, and reconciled?: Yes (Medications Reviewed) Any new allergies since your discharge?: No (new allergy in hospital PCN) Dietary orders reviewed?: No Do you have support at home?: Yes People in Home: spouse Name of Support/Comfort Primary Source: Crystal Rich  Medications Reviewed Today: Medications Reviewed Today     Reviewed by Crystal Cook, RN (Case Manager) on 12/04/22 at 1559  Med List Status: <None>   Medication Order Taking? Sig Documenting Provider Last Dose Status Informant  albuterol (VENTOLIN HFA) 108 (90 Base) MCG/ACT inhaler [id number] Yes Inhale 2 puffs into the lungs every 4 (four) hours as needed for wheezing or shortness of breath. Reubin Milan, MD Taking Active   clonazePAM Crystal Rich) 1 MG tablet [id number] Yes 2 (two) times daily as needed.  [provider] Taking Active            Med Note Osa Craver   Fri Nov 15, 2017  1:31 PM)    COMFORT EZ PEN NEEDLES 31G X 5 MM MISC [id number]  USE TWO PEN NEEDLES DAILY Reubin Milan, MD  Active   Continuous Glucose Sensor (DEXCOM G7 SENSOR) MISC [id number]  1 each by Does not apply route daily. Reubin Milan, MD  Active   cyclobenzaprine (FLEXERIL) 10 MG tablet [id number] Yes TAKE 1 TABLET BY MOUTH 3 TIMES DAILY Reubin Milan, MD Taking Active   escitalopram (LEXAPRO) 20 MG tablet [id number] Yes  Take 40 mg by mouth daily. [provider] Taking Active   fluticasone (FLONASE) 50 MCG/ACT nasal spray [id number] Yes Place 2 sprays into both nostrils daily. Reubin Milan, MD Taking Active   hydrOXYzine (ATARAX/VISTARIL) 25 MG tablet [id number] Yes Take 2 tablets by mouth every 8 (eight) hours as needed.  [provider] Taking Active            Med Note Osa Craver   Fri Nov 15, 2017  1:34 PM)    Insulin Glargine [facility]) 100 UNIT/ML [id number] Yes 30 units every AM and 15 units every PM Reubin Milan, MD Taking Active   isosorbide dinitrate (ISORDIL) 10 MG tablet [id number] Yes Take 10 mg by mouth 2 (two) times daily. [provider] Taking Active   lidocaine (LIDODERM) 5 % [id number] Yes 1 patch daily. [provider] Taking Active   liraglutide (VICTOZA) 18 MG/3ML SOPN [id number] Yes INJECT 1.8mg  INTO THE SKIN DAILY AT Crystal Rich, Crystal Cowden, MD Taking Active   losartan (COZAAR) 50 MG tablet [id number] Yes TAKE 1 TABLET BY MOUTH DAILY Reubin Milan, MD Taking Active   metFORMIN (GLUCOPHAGE) 500 MG tablet [id number] Yes TAKE 3 TABLETS BY MOUTH DAILY WITH FOOD Reubin Milan, MD Taking Active   mometasone [facility], 60 METERED DOSES,) 220 MCG/ACT inhaler [id number] Yes Inhale 2 puffs into the lungs daily. Reubin Milan, MD Taking Active   montelukast (SINGULAIR) 10 MG tablet [id number] Yes TAKE 1 TABLET BY MOUTH AT BEDTIME Reubin Milan, MD Taking Active   pantoprazole (PROTONIX) 40 MG  tablet [id number] Yes TAKE 1 TABLET BY MOUTH TWICE DAILY Reubin Milan, MD Taking Active   promethazine (PHENERGAN) 25 MG tablet [id number] Yes Take 1 tablet (25 mg total) by mouth 2 (two) times daily as needed. Reubin Milan, MD Taking Active   promethazine-dextromethorphan (PROMETHAZINE-DM) 6.25-15 MG/5ML syrup [id number] Yes Take 5 mLs by mouth at bedtime for 24 days. Reubin Milan, MD Taking Active   rizatriptan (MAXALT) 10 MG tablet  [id number] Yes TAKE 1 TABLET BY MOUTH AS NEEDED FOR MIGRAINE. MAY REPEAT IN 2 HOURS IF NEEDED Reubin Milan, MD Taking Active   rOPINIRole (REQUIP) 1 MG tablet [id number] Yes TAKE 3 TABLETS BY MOUTH AT BEDTIME Reubin Milan, MD Taking Active   rosuvastatin (CRESTOR) 5 MG tablet [id number] Yes Take 1 tablet (5 mg total) by mouth 3 (three) times a week. Monday, Wednesday, Friday Reubin Milan, MD Taking Active             Home Care and Equipment/Supplies: Were Home Health Services Ordered?: No Any new equipment or medical supplies ordered?: No  Functional Questionnaire: Do you need assistance with bathing/showering or dressing?: No Do you need assistance with meal preparation?: No Do you need assistance with eating?: No Do you have difficulty maintaining continence: No Do you need assistance with getting out of bed/getting out of a chair/moving?: No Do you have difficulty managing or taking your medications?: No  Follow up appointments reviewed: PCP Follow-up appointment confirmed?: Yes Date of PCP follow-up appointment?: 01/25/23 Follow-up Provider: [facility] Follow-up appointment confirmed?: Yes Date of Specialist follow-up appointment?: 01/16/23 Follow-Up Specialty Provider:: Dr Marja Kays Do you need transportation to your follow-up appointment?: No Do you understand care options if your condition(s) worsen?: Yes-patient verbalized understanding  SDOH Interventions Today    Flowsheet Row Most Recent Value  SDOH Interventions   Food Insecurity Interventions Intervention Not Indicated  Housing Interventions Intervention Not Indicated  Transportation Interventions Intervention Not Indicated, Patient Resources (Friends/Family)      Interventions Today    Flowsheet Row Most Recent Value  General Interventions   General Interventions Discussed/Reviewed General Interventions Discussed, General Interventions Reviewed, Doctor Visits  Doctor Visits  Discussed/Reviewed Doctor Visits Discussed, Doctor Visits Reviewed  Pharmacy Interventions   Pharmacy Dicussed/Reviewed Pharmacy Topics Discussed, Pharmacy Topics Reviewed      TOC Interventions Today    Flowsheet Row Most Recent Value  TOC Interventions   TOC Interventions Discussed/Reviewed TOC Interventions Discussed, TOC Interventions Reviewed       Gean Maidens BSN RN [facility] [phone]

## 2022-12-10 ENCOUNTER — Inpatient Hospital Stay: Payer: 59 | Admitting: Internal Medicine

## 2022-12-11 ENCOUNTER — Encounter: Payer: Self-pay | Admitting: Internal Medicine

## 2022-12-14 ENCOUNTER — Ambulatory Visit (INDEPENDENT_AMBULATORY_CARE_PROVIDER_SITE_OTHER): Payer: 59 | Admitting: Internal Medicine

## 2022-12-14 ENCOUNTER — Encounter: Payer: Self-pay | Admitting: Internal Medicine

## 2022-12-14 VITALS — BP 124/78 | HR 113 | Ht 61.0 in | Wt 154.0 lb

## 2022-12-14 DIAGNOSIS — F445 Conversion disorder with seizures or convulsions: Secondary | ICD-10-CM | POA: Diagnosis not present

## 2022-12-14 DIAGNOSIS — R7989 Other specified abnormal findings of blood chemistry: Secondary | ICD-10-CM

## 2022-12-14 DIAGNOSIS — Z91018 Allergy to other foods: Secondary | ICD-10-CM

## 2022-12-14 NOTE — Progress Notes (Signed)
Date:  12/14/2022   Name:  Crystal Rich   DOB:  12/06/1965   MRN:  [id number]   Chief Complaint: Hospitalization Follow-up (Patient said hospital wants liver rechecked. ) Admitted to [facility] 7/26 - 28/2024 with abdominal pain. TOC call done on 12/04/22.  HPI  # R-sided Abdominal Pain # Constipation # Acute hepatitis # Likely DILI P/w 1d constant R-sided abd pain and nausea. No vomiting, diarrhea, and nl BMs. Pain is constant "pressure" w/ episodic sharp pain to 10/10. Not postprandial. Hx appy. UA neg wbc and rbc. CT abd/pel air fluid levels, poss enteritis vs ileus. Afebrile, nl WBC, no hematochezia. Lactate of 3.1 on admit, but vessels nl on CT w/ contrast (ischemia unlikely). VSS, lactate downtrending. She had elevated LFTs (AST, ALT) which increased during hosp stay. R factor of 5.7, indicating hepatocellular injury. CT=prior cholecystectomy with n/ CBD.  She was given IVF and then tol diet. She was treated with opiates for pain, which later resolved. Given the rise in LFTs and RUQ nature of abd pain, my conclusion is that this whole presentation was due to liver injury. Consider passed CBD stone, but CBD and bili normal and no GB, no this is less likely. Liver US showed only steatosis. No hypotension to suggest shock liver. 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 Amox liver injury is listed are rare. Augmentin is a very common cause of DILI but it is usually cholestatic and attributed to the clavulanate, which she did not receive.  Her pain and nausea resolved, tol po, and the pace of her rise is liver enzymes was very slow, so I sent her home with close outpt followup. --> rechk LFTs at followup, if not downtrending, may need further workup for other causes (AIH or biliary dyskinesia or MASLD).  # Elevated Lactate,  resolved Lactate of 3.1 on admission, improving w/ IVF.   Imaging: U/S - Liver:  Moderately increased echogenicity suggesting steatosis. No focal  hepatic abnormalities. No evidence of intrahepatic biliary dilatation.  Size:  19.4 cm  Main portal vein:  Patent  Gallbladder: Surgically absent  Common Bile Duct diameter:  5 mm  IMPRESSION:   1. Hepatic steatosis.   CT ABD/Pelvis: IMPRESSION:  1. Air-fluid levels in mildly dilated loops of terminal ileum with a few  air-fluid levels, most likely representing ileus, enteritis and proximal  colitis. Clinical and/or imaging follow-up recommended to verify  resolution.  2. Other incidental findings.   Lab Results  Component Value Date   NA 138 10/16/2022   K 4.1 10/16/2022   CO2 23 10/16/2022   GLUCOSE 247 (H) 10/16/2022   BUN 8 10/16/2022   CREATININE 0.71 10/16/2022   CALCIUM 9.4 10/16/2022   EGFR 100 10/16/2022   GFRNONAA 85 08/17/2019   Lab Results  Component Value Date   CHOL 151 07/20/2022   HDL 32 (L) 07/20/2022   LDLCALC 73 07/20/2022   TRIG 281 (H) 07/20/2022   CHOLHDL 4.7 (H) 07/20/2022   Lab Results  Component Value Date   TSH 4.690 (H) 01/05/2021   Lab Results  Component Value Date   HGBA1C 9 11/09/2022   Lab Results  Component Value Date   WBC 7.7 07/20/2022   HGB 12.2 07/20/2022   HCT 37.6 07/20/2022   MCV 85 07/20/2022   PLT 252  07/20/2022   Lab Results  Component Value Date   ALT 36 (H) 10/16/2022   AST 46 (H) 10/16/2022   ALKPHOS 120 10/16/2022   BILITOT <0.2 10/16/2022   No results found for: "25OHVITD2", "25OHVITD3", "VD25OH"   Review of Systems  Constitutional:  Negative for chills, fatigue and fever.  Respiratory:  Negative for chest tightness and shortness of breath.   Cardiovascular:  Negative for chest pain and palpitations.  Gastrointestinal:  Positive for abdominal pain. Negative for abdominal distention, constipation, diarrhea and vomiting.  Genitourinary:  Negative for  frequency and urgency.  Neurological:  Positive for seizures.  Psychiatric/Behavioral:  Positive for dysphoric mood. Negative for sleep disturbance. The patient is nervous/anxious.     Patient Active Problem List   Diagnosis Date Noted   Psychogenic nonepileptic seizure 11/02/2022   Hyperlipidemia associated with type 2 diabetes mellitus (HCC) 01/09/2022   Status post total hysterectomy and bilateral salpingo-oophorectomy 03/01/2020   Gastroesophageal reflux disease without esophagitis 12/24/2017   History of Clostridium difficile colitis 04/04/2017   Restless legs syndrome 12/31/2016   Essential hypertension 12/31/2016   Lymphadenopathy, axillary 04/25/2015   Type II diabetes mellitus with complication (HCC) 04/25/2015   Insomnia 03/11/2015   Chronic abdominal pain 11/17/2014   Anxiety, generalized 11/17/2014   Depression, major, recurrent, 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/2014   Migraine without aura and responsive to treatment 11/17/2014   Asthma, moderate persistent 11/17/2014    Allergies  Allergen Reactions   Macrobid [Nitrofurantoin] Other (See Comments)    "Patient could not walk"   Nsaids Shortness Of Breath   Doxycycline Itching   Januvia [Sitagliptin] Nausea And Vomiting   Sulfa Antibiotics Rash   Aspirin    Ketorolac     Other reaction(s): Other (See Comments) Patient states Burning sensation inside Other reaction(s): Other (See Comments) Patient states Burning sensation inside   Morphine Sulfate Nausea And Vomiting   Nalbuphine     Other reaction(s): Other (See Comments) Burning on inside   Prochlorperazine Edisylate Other (See Comments)    tachycardia   Trazodone Cough   Bupropion Rash   Oxybutynin Rash    Past Surgical History:  Procedure Laterality Date   APPENDECTOMY     BILATERAL SALPINGOOPHORECTOMY  05/07/2008   CHOLECYSTECTOMY  05/08/2003   COLONOSCOPY  05/07/2014   TOTAL ABDOMINAL HYSTERECTOMY  1999    Social History   Tobacco Use    Smoking status: Never   Smokeless tobacco: Never  Substance Use Topics   Alcohol use: No    Alcohol/week: 0.0 standard drinks of alcohol   Drug use: No     Medication list has been reviewed and updated.  Current Meds  Medication Sig   albuterol (VENTOLIN HFA) 108 (90 Base) MCG/ACT inhaler Inhale 2 puffs into the lungs every 4 (four) hours as needed for wheezing or shortness of breath.   clonazePAM (KLONOPIN) 1 MG tablet 2 (two) times daily as needed.    COMFORT EZ PEN NEEDLES 31G X 5 MM MISC USE TWO PEN NEEDLES DAILY   Continuous Glucose Sensor (DEXCOM G7 SENSOR) MISC 1 each by Does not apply route daily.   cyclobenzaprine (FLEXERIL) 10 MG tablet TAKE 1 TABLET BY MOUTH 3 TIMES DAILY   escitalopram (LEXAPRO) 20 MG tablet Take 40 mg by mouth daily.   fluticasone (FLONASE) 50 MCG/ACT nasal spray Place 2 sprays into both nostrils daily.   hydrOXYzine (ATARAX/VISTARIL) 25 MG tablet Take 2 tablets by mouth every 8 (eight) hours as needed.    Insulin  Glargine (BASAGLAR KWIKPEN) 100 UNIT/ML 30 units every AM and 15 units every PM   isosorbide dinitrate (ISORDIL) 10 MG tablet Take 10 mg by mouth 2 (two) times daily.   lidocaine (LIDODERM) 5 % 1 patch daily.   liraglutide (VICTOZA) 18 MG/3ML SOPN INJECT 1.8mg  INTO THE SKIN DAILY AT 6am   losartan (COZAAR) 50 MG tablet TAKE 1 TABLET BY MOUTH DAILY   metFORMIN (GLUCOPHAGE) 500 MG tablet TAKE 3 TABLETS BY MOUTH DAILY WITH FOOD   mometasone (ASMANEX, 60 METERED DOSES,) 220 MCG/ACT inhaler Inhale 2 puffs into the lungs daily.   montelukast (SINGULAIR) 10 MG tablet TAKE 1 TABLET BY MOUTH AT BEDTIME   pantoprazole (PROTONIX) 40 MG tablet TAKE 1 TABLET BY MOUTH TWICE DAILY   promethazine (PHENERGAN) 25 MG tablet Take 1 tablet (25 mg total) by mouth 2 (two) times daily as needed.   rizatriptan (MAXALT) 10 MG tablet TAKE 1 TABLET BY MOUTH AS NEEDED FOR MIGRAINE. MAY REPEAT IN 2 HOURS IF NEEDED   rOPINIRole (REQUIP) 1 MG tablet TAKE 3 TABLETS BY MOUTH AT  BEDTIME   rosuvastatin (CRESTOR) 5 MG tablet Take 1 tablet (5 mg total) by mouth 3 (three) times a week. Monday, Wednesday, Friday   Xylitol (XYLIMELTS) 500 MG DISK Take 500 mg by mouth daily as needed. For Dry Mouth       12/14/2022    1:52 PM 11/19/2022    2:05 PM 11/02/2022    9:08 AM 10/24/2022    1:57 PM  GAD 7 : Generalized Anxiety Score  Nervous, Anxious, on Edge 0 1 1 1   Control/stop worrying 1 1 1 1   Worry too much - different things 1 1 1 1   Trouble relaxing 1 1 1  0  Restless 0 1 1 0  Easily annoyed or irritable 2 1 3 1   Afraid - awful might happen 0 1 0 0  Total GAD 7 Score 5 7 8 4   Anxiety Difficulty Somewhat difficult Somewhat difficult Somewhat difficult Somewhat difficult       12/14/2022    1:52 PM 11/19/2022    2:04 PM 11/02/2022    9:08 AM  Depression screen PHQ 2/9  Decreased Interest 1 2 2   Down, Depressed, Hopeless 1 2 2   PHQ - 2 Score 2 4 4   Altered sleeping 1 2 3   Tired, decreased energy 2 2 3   Change in appetite 1 2 3   Feeling bad or failure about yourself  0 2 1  Trouble concentrating 1 2 2   Moving slowly or fidgety/restless 1 1 1   Suicidal thoughts 0 0 0  PHQ-9 Score 8 15 17   Difficult doing work/chores Somewhat difficult  Somewhat difficult    BP Readings from Last 3 Encounters:  12/14/22 124/78  11/19/22 124/60  11/02/22 122/78    Physical Exam Vitals and nursing note reviewed.  Constitutional:      General: She is not in acute distress.    Appearance: She is well-developed. She is obese.  HENT:     Head: Normocephalic and atraumatic.  Cardiovascular:     Rate and Rhythm: Normal rate and regular rhythm.  Pulmonary:     Effort: Pulmonary effort is normal. No respiratory distress.     Breath sounds: No wheezing or rhonchi.  Abdominal:     General: Abdomen is protuberant. Bowel sounds are normal.     Palpations: Abdomen is soft.     Tenderness: There is abdominal tenderness in the right upper quadrant. There is right CVA  tenderness.  There is no left CVA tenderness, guarding or rebound.  Musculoskeletal:     Cervical back: Normal range of motion.  Lymphadenopathy:     Cervical: No cervical adenopathy.  Skin:    General: Skin is warm and dry.     Findings: No rash.  Neurological:     Mental Status: She is alert and oriented to person, place, and time.  Psychiatric:        Mood and Affect: Mood normal.        Behavior: Behavior normal.     Wt Readings from Last 3 Encounters:  12/14/22 154 lb (69.9 kg)  11/19/22 156 lb (70.8 kg)  11/02/22 159 lb (72.1 kg)    BP 124/78   Pulse (!) 113   Ht 5\' 1"  (1.549 m)   Wt 154 lb (69.9 kg)   SpO2 97%   BMI 29.10 kg/m   Assessment and Plan:  Problem List Items Addressed This Visit       Unprioritized   Psychogenic nonepileptic seizure    She continues to have episodes of apparent seizures but has not called EMS She has Neurology appointment in the near future      Other Visit Diagnoses     Elevated liver function tests    -  Primary   felt to be due to amox which has been discontinued will check labs and advise   Relevant Orders   Comprehensive metabolic panel   Allergy to alpha-gal           No follow-ups on file.    Reubin Milan, MD [facility] Primary Care and Sports Medicine [place]

## 2022-12-14 NOTE — Assessment & Plan Note (Signed)
She continues to have episodes of apparent seizures but has not called EMS She has Neurology appointment in the near future

## 2022-12-17 ENCOUNTER — Other Ambulatory Visit: Payer: Self-pay | Admitting: Internal Medicine

## 2022-12-17 DIAGNOSIS — E118 Type 2 diabetes mellitus with unspecified complications: Secondary | ICD-10-CM

## 2022-12-19 ENCOUNTER — Other Ambulatory Visit: Payer: Self-pay | Admitting: Internal Medicine

## 2022-12-19 DIAGNOSIS — E118 Type 2 diabetes mellitus with unspecified complications: Secondary | ICD-10-CM

## 2022-12-19 DIAGNOSIS — Z79899 Other long term (current) drug therapy: Secondary | ICD-10-CM | POA: Diagnosis not present

## 2022-12-19 DIAGNOSIS — G8929 Other chronic pain: Secondary | ICD-10-CM | POA: Diagnosis not present

## 2022-12-19 DIAGNOSIS — F332 Major depressive disorder, recurrent severe without psychotic features: Secondary | ICD-10-CM | POA: Diagnosis not present

## 2022-12-19 DIAGNOSIS — F449 Dissociative and conversion disorder, unspecified: Secondary | ICD-10-CM | POA: Diagnosis not present

## 2022-12-19 DIAGNOSIS — F411 Generalized anxiety disorder: Secondary | ICD-10-CM | POA: Diagnosis not present

## 2022-12-20 NOTE — Telephone Encounter (Signed)
Requested Prescriptions  Pending Prescriptions Disp Refills   liraglutide (VICTOZA) 18 MG/3ML SOPN [Pharmacy Med Name: VICTOZA 18 MG/3ML SUBQ SOLN ML] 9 mL 0    Sig: INJECT 1.8 mg INTO THE SKIN DAILY AT 6 PM     Endocrinology:  Diabetes - GLP-1 Receptor Agonists Failed - 12/19/2022 12:18 PM      Failed - HBA1C is between 0 and 7.9 and within 180 days    Hemoglobin A1C  Date Value Ref Range Status  11/09/2022 9  Final         Passed - Valid encounter within last 6 months    Recent Outpatient Visits           6 days ago Elevated liver function tests   [facility] at [facility], [name], MD   1 month ago Psychogenic nonepileptic seizure   [place] Primary Care & Sports Medicine at [facility], [name], MD   1 month ago Seizure disorder [facility])   [place] Primary Care & Sports Medicine at [facility], [name], MD   1 month ago Type II diabetes mellitus with complication [facility])   [place] Primary Care & Sports Medicine at [facility], [name], MD   3 months ago Type II diabetes mellitus with complication [facility])   [place] Primary Care & Sports Medicine at [facility], [name], MD       Future Appointments             In 1 month [name], [name], MD [facility] Primary Care & Sports Medicine at [facility], [facility]

## 2022-12-24 ENCOUNTER — Other Ambulatory Visit: Payer: Self-pay | Admitting: Internal Medicine

## 2022-12-24 DIAGNOSIS — K219 Gastro-esophageal reflux disease without esophagitis: Secondary | ICD-10-CM

## 2022-12-25 DIAGNOSIS — G8929 Other chronic pain: Secondary | ICD-10-CM | POA: Diagnosis not present

## 2022-12-25 DIAGNOSIS — F411 Generalized anxiety disorder: Secondary | ICD-10-CM | POA: Diagnosis not present

## 2022-12-25 DIAGNOSIS — Z79899 Other long term (current) drug therapy: Secondary | ICD-10-CM | POA: Diagnosis not present

## 2022-12-25 DIAGNOSIS — F332 Major depressive disorder, recurrent severe without psychotic features: Secondary | ICD-10-CM | POA: Diagnosis not present

## 2022-12-25 DIAGNOSIS — F449 Dissociative and conversion disorder, unspecified: Secondary | ICD-10-CM | POA: Diagnosis not present

## 2022-12-25 NOTE — Telephone Encounter (Signed)
Requested Prescriptions  Pending Prescriptions Disp Refills   pantoprazole (PROTONIX) 40 MG tablet [Pharmacy Med Name: PANTOPRAZOLE SODIUM 40 MG DR TAB] 180 tablet 0    Sig: TAKE 1 TABLET BY MOUTH TWICE DAILY     Gastroenterology: Proton Pump Inhibitors Passed - 12/24/2022 10:04 AM      Passed - Valid encounter within last 12 months    Recent Outpatient Visits           1 week ago Elevated liver function tests   [facility] at [facility], [name], MD   1 month ago Psychogenic nonepileptic seizure   [place] [facility] Care & Sports Medicine at [facility], [name], MD   1 month ago Seizure disorder [facility])   [place] Primary Care & Sports Medicine at [facility], [name], MD   2 months ago Type II diabetes mellitus with complication [facility])   [place] Primary Care & Sports Medicine at [facility], [name], MD   3 months ago Type II diabetes mellitus with complication [facility])    Primary Care & Sports Medicine at [facility], [name], MD       Future Appointments             In 1 month [name], [name], MD [facility] at [facility], [facility]

## 2023-01-01 ENCOUNTER — Other Ambulatory Visit: Payer: Self-pay | Admitting: Internal Medicine

## 2023-01-01 DIAGNOSIS — G2581 Restless legs syndrome: Secondary | ICD-10-CM

## 2023-01-02 ENCOUNTER — Telehealth: Payer: Self-pay | Admitting: Internal Medicine

## 2023-01-02 DIAGNOSIS — G2581 Restless legs syndrome: Secondary | ICD-10-CM

## 2023-01-02 NOTE — Telephone Encounter (Signed)
Requested Prescriptions  Pending Prescriptions Disp Refills   rOPINIRole (REQUIP) 1 MG tablet [Pharmacy Med Name: ROPINIROLE HCL 1 MG TAB] 270 tablet 0    Sig: TAKE 3 TABLETS BY MOUTH AT BEDTIME     Neurology:  Parkinsonian Agents Failed - 01/01/2023 12:08 PM      Failed - Last Heart Rate in normal range    Pulse Readings from Last 1 Encounters:  12/14/22 (!) 113         Passed - Last BP in normal range    BP Readings from Last 1 Encounters:  12/14/22 124/78         Passed - Valid encounter within last 12 months    Recent Outpatient Visits           2 weeks ago Elevated liver function tests   [facility] Medicine at [facility], [name], MD   1 month ago Psychogenic nonepileptic seizure   [facility] Primary Care & Sports Medicine at [facility], [name], MD   2 months ago Seizure disorder [facility])   [place] Primary Care & Sports Medicine at [facility], [name], MD   2 months ago Type II diabetes mellitus with complication [facility])   [place] Primary Care & Sports Medicine at [facility], [name], MD   4 months ago Type II diabetes mellitus with complication [facility])   [place] Primary Care & Sports Medicine at [facility], [name], MD       Future Appointments             In 3 weeks [name] [name], MD [facility] Primary Care & Sports Medicine at [facility], [facility]

## 2023-01-02 NOTE — Telephone Encounter (Signed)
Already requested by the pharmacy on 01/01/23 in a separate refill encounter, pending approval.

## 2023-01-02 NOTE — Telephone Encounter (Signed)
Medication Refill - Medication: rOPINIRole (REQUIP) 1 MG tablet   Has the patient contacted their pharmacy? No.  Preferred Pharmacy (with phone number or street name):  [facility], [place] - [address] Phone: [phone]  Fax: [phone]     Has the patient been seen for an appointment in the last year OR does the patient have an upcoming appointment? Yes.    Agent: Please be advised that RX refills may take up to 3 business days. We ask that you follow-up with your pharmacy.

## 2023-01-09 ENCOUNTER — Other Ambulatory Visit: Payer: Self-pay | Admitting: Internal Medicine

## 2023-01-16 DIAGNOSIS — Z79899 Other long term (current) drug therapy: Secondary | ICD-10-CM | POA: Diagnosis not present

## 2023-01-16 DIAGNOSIS — E119 Type 2 diabetes mellitus without complications: Secondary | ICD-10-CM | POA: Diagnosis not present

## 2023-01-16 DIAGNOSIS — Z794 Long term (current) use of insulin: Secondary | ICD-10-CM | POA: Diagnosis not present

## 2023-01-16 LAB — HEPATIC FUNCTION PANEL
ALT: 42 U/L — AB (ref 7–35)
AST: 38 — AB (ref 13–35)
Alkaline Phosphatase: 86 (ref 25–125)
Bilirubin, Total: 0.2

## 2023-01-16 LAB — LIPID PANEL
Cholesterol: 153 (ref 0–200)
HDL: 32 — AB (ref 35–70)
LDL Cholesterol: 57
Triglycerides: 250 — AB (ref 40–160)

## 2023-01-16 LAB — TSH: TSH: 3.06 (ref 0.41–5.90)

## 2023-01-25 ENCOUNTER — Ambulatory Visit (INDEPENDENT_AMBULATORY_CARE_PROVIDER_SITE_OTHER): Payer: 59 | Admitting: Internal Medicine

## 2023-01-25 ENCOUNTER — Encounter: Payer: Self-pay | Admitting: Internal Medicine

## 2023-01-25 VITALS — BP 102/70 | HR 102 | Ht 61.0 in | Wt 158.0 lb

## 2023-01-25 DIAGNOSIS — E118 Type 2 diabetes mellitus with unspecified complications: Secondary | ICD-10-CM | POA: Diagnosis not present

## 2023-01-25 DIAGNOSIS — E785 Hyperlipidemia, unspecified: Secondary | ICD-10-CM | POA: Diagnosis not present

## 2023-01-25 DIAGNOSIS — E1169 Type 2 diabetes mellitus with other specified complication: Secondary | ICD-10-CM | POA: Diagnosis not present

## 2023-01-25 DIAGNOSIS — Z7984 Long term (current) use of oral hypoglycemic drugs: Secondary | ICD-10-CM | POA: Diagnosis not present

## 2023-01-25 DIAGNOSIS — M1711 Unilateral primary osteoarthritis, right knee: Secondary | ICD-10-CM | POA: Insufficient documentation

## 2023-01-25 DIAGNOSIS — G2581 Restless legs syndrome: Secondary | ICD-10-CM | POA: Diagnosis not present

## 2023-01-25 LAB — POCT GLYCOSYLATED HEMOGLOBIN (HGB A1C): Hemoglobin A1C: 6.9 % — AB (ref 4.0–5.6)

## 2023-01-25 MED ORDER — ROPINIROLE HCL 1 MG PO TABS
5.0000 mg | ORAL_TABLET | Freq: Every day | ORAL | 0 refills | Status: DC
Start: 2023-01-25 — End: 2023-05-03

## 2023-01-25 NOTE — Assessment & Plan Note (Signed)
She has been working hard to get her DM under control.  She is drinking more water and has lost some weight. Her A1C today is down from 9.0 to 6.9 Letter will be sent to Dr. Milbert Coulter of [facility] with this information.

## 2023-01-25 NOTE — Progress Notes (Signed)
Date:  01/25/2023   Name:  Crystal Rich   DOB:  04-21-1966   MRN:  [id number]   Chief Complaint: Diabetes  Diabetes She presents for her follow-up diabetic visit. She has type 2 diabetes mellitus. Her disease course has been improving. Pertinent negatives for hypoglycemia include no headaches, nervousness/anxiousness or tremors. Pertinent negatives for diabetes include no chest pain, no fatigue, no polydipsia and no polyuria.  Knee Pain  The pain is present in the right knee. The quality of the pain is described as aching and shooting. The pain is severe. Pertinent negatives include no numbness. She has tried ice (lidocaine patches) for the symptoms. The treatment provided mild relief.  RLS -  having more symptoms during the day now and only taking 3 pills per day. Would like to increase dose if needed.  Lab Results  Component Value Date   NA 139 12/14/2022   K 4.1 12/14/2022   CO2 24 12/14/2022   GLUCOSE 158 (H) 12/14/2022   BUN 15 12/14/2022   CREATININE 0.74 12/14/2022   CALCIUM 9.4 12/14/2022   EGFR 95 12/14/2022   GFRNONAA 85 08/17/2019   Lab Results  Component Value Date   CHOL 153 01/16/2023   HDL 32 (A) 01/16/2023   LDLCALC 57 01/16/2023   TRIG 250 (A) 01/16/2023   CHOLHDL 4.7 (H) 07/20/2022   Lab Results  Component Value Date   TSH 3.06 01/16/2023   Lab Results  Component Value Date   HGBA1C 6.9 (A) 01/25/2023   Lab Results  Component Value Date   WBC 7.7 07/20/2022   HGB 12.2 07/20/2022   HCT 37.6 07/20/2022   MCV 85 07/20/2022   PLT 252 07/20/2022   Lab Results  Component Value Date   ALT 42 (A) 01/16/2023   AST 38 (A) 01/16/2023   ALKPHOS 86 01/16/2023   BILITOT <0.2 12/14/2022   No results found for: "25OHVITD2", "25OHVITD3", "VD25OH"   Review of Systems  Constitutional:  Negative for appetite change, fatigue, fever and unexpected weight change.  HENT:  Negative for tinnitus and trouble swallowing.   Eyes:  Negative for visual  disturbance.  Respiratory:  Negative for cough, chest tightness and shortness of breath.   Cardiovascular:  Negative for chest pain, palpitations and leg swelling.  Gastrointestinal:  Negative for abdominal pain.  Endocrine: Negative for polydipsia and polyuria.  Genitourinary:  Negative for dysuria and hematuria.  Musculoskeletal:  Positive for arthralgias, gait problem and myalgias.  Neurological:  Positive for syncope (episodes are less frequent and shorter in duration.  husband thinks they may be a response to episodes of lancintating severe knee pain.). Negative for tremors, numbness and headaches.  Psychiatric/Behavioral:  Negative for dysphoric mood and sleep disturbance. The patient is not nervous/anxious.     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/02/2022   Hyperlipidemia associated with type 2 diabetes mellitus (HCC) 01/09/2022   Status post total hysterectomy and bilateral salpingo-oophorectomy 03/01/2020   Gastroesophageal reflux disease without esophagitis 12/24/2017   History of Clostridium difficile colitis 04/04/2017   Restless legs syndrome 12/31/2016   Essential hypertension 12/31/2016   Lymphadenopathy, axillary 04/25/2015   Type II diabetes mellitus with complication (HCC) 04/25/2015   Insomnia 03/11/2015   Chronic abdominal pain 11/17/2014   Esophageal spasm 11/17/2014   Anxiety, generalized 11/17/2014   Depression, major, recurrent, 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/2014   Migraine without aura and responsive  to treatment 11/17/2014   Asthma, moderate persistent 11/17/2014    Allergies  Allergen Reactions   Macrobid [Nitrofurantoin] Other (See Comments)    "Patient could not walk"   Nsaids Shortness Of Breath   Doxycycline Itching   Januvia [Sitagliptin] Nausea And Vomiting   Sulfa Antibiotics Rash   Aspirin    Ketorolac     Other reaction(s): Other (See  Comments) Patient states Burning sensation inside Other reaction(s): Other (See Comments) Patient states Burning sensation inside   Morphine Sulfate Nausea And Vomiting   Nalbuphine     Other reaction(s): Other (See Comments) Burning on inside   Prochlorperazine Edisylate Other (See Comments)    tachycardia   Trazodone Cough   Bupropion Rash   Oxybutynin Rash    Past Surgical History:  Procedure Laterality Date   APPENDECTOMY     BILATERAL SALPINGOOPHORECTOMY  05/07/2008   CHOLECYSTECTOMY  05/08/2003   COLONOSCOPY  05/07/2014   TOTAL ABDOMINAL HYSTERECTOMY  1999    Social History   Tobacco Use   Smoking status: Never   Smokeless tobacco: Never  Substance Use Topics   Alcohol use: No    Alcohol/week: 0.0 standard drinks of alcohol   Drug use: No     Medication list has been reviewed and updated.  Current Meds  Medication Sig   albuterol (VENTOLIN HFA) 108 (90 Base) MCG/ACT inhaler Inhale 2 puffs into the lungs every 4 (four) hours as needed for wheezing or shortness of breath.   clonazePAM (KLONOPIN) 1 MG tablet 2 (two) times daily as needed.    COMFORT EZ PEN NEEDLES 31G X 5 MM MISC USE TWO PEN NEEDLES DAILY   Continuous Glucose Sensor (DEXCOM G7 SENSOR) MISC 1 each by Does not apply route daily.   cyclobenzaprine (FLEXERIL) 10 MG tablet TAKE 1 TABLET BY MOUTH 3 TIMES DAILY   escitalopram (LEXAPRO) 20 MG tablet Take 40 mg by mouth daily.   fluticasone (FLONASE) 50 MCG/ACT nasal spray Place 2 sprays into both nostrils daily.   hydrOXYzine (ATARAX/VISTARIL) 25 MG tablet Take 2 tablets by mouth every 8 (eight) hours as needed.    Insulin Glargine (BASAGLAR KWIKPEN) 100 UNIT/ML 30 units every AM and 15 units every PM   isosorbide dinitrate (ISORDIL) 10 MG tablet Take 10 mg by mouth 2 (two) times daily.   lidocaine (LIDODERM) 5 % 1 patch daily.   liraglutide (VICTOZA) 18 MG/3ML SOPN INJECT 1.8 mg INTO THE SKIN DAILY AT 6 PM   losartan (COZAAR) 50 MG tablet TAKE 1 TABLET  BY MOUTH DAILY   metFORMIN (GLUCOPHAGE) 500 MG tablet TAKE 3 TABLETS BY MOUTH DAILY WITH FOOD (Patient taking differently: Take 1,000 mg by mouth 2 (two) times daily with a meal.)   mometasone (ASMANEX, 60 METERED DOSES,) 220 MCG/ACT inhaler Inhale 2 puffs into the lungs daily.   montelukast (SINGULAIR) 10 MG tablet TAKE 1 TABLET BY MOUTH AT BEDTIME   pantoprazole (PROTONIX) 40 MG tablet TAKE 1 TABLET BY MOUTH TWICE DAILY   promethazine (PHENERGAN) 25 MG tablet Take 1 tablet (25 mg total) by mouth 2 (two) times daily as needed.   rizatriptan (MAXALT) 10 MG tablet TAKE 1 TABLET BY MOUTH AS NEEDED FOR MIGRAINE. MAY REPEAT IN 2 HOURS IF NEEDED   rosuvastatin (CRESTOR) 5 MG tablet Take 1 tablet (5 mg total) by mouth 3 (three) times a week. Monday, Wednesday, Friday   Xylitol (XYLIMELTS) 500 MG DISK Take 500 mg by mouth daily as needed. For Dry Mouth   [DISCONTINUED]  rOPINIRole (REQUIP) 1 MG tablet TAKE 3 TABLETS BY MOUTH AT BEDTIME (Patient taking differently: Take 2 mg by mouth at bedtime.)       01/25/2023    1:21 PM 12/14/2022    1:52 PM 11/19/2022    2:05 PM 11/02/2022    9:08 AM  GAD 7 : Generalized Anxiety Score  Nervous, Anxious, on Edge 1 0 1 1  Control/stop worrying 2 1 1 1   Worry too much - different things 2 1 1 1   Trouble relaxing 1 1 1 1   Restless 0 0 1 1  Easily annoyed or irritable 1 2 1 3   Afraid - awful might happen 0 0 1 0  Total GAD 7 Score 7 5 7 8   Anxiety Difficulty Somewhat difficult Somewhat difficult Somewhat difficult Somewhat difficult       01/25/2023    1:21 PM 12/14/2022    1:52 PM 11/19/2022    2:04 PM  Depression screen PHQ 2/9  Decreased Interest 0 1 2  Down, Depressed, Hopeless 1 1 2   PHQ - 2 Score 1 2 4   Altered sleeping 2 1 2   Tired, decreased energy 2 2 2   Change in appetite 0 1 2  Feeling bad or failure about yourself  2 0 2  Trouble concentrating 1 1 2   Moving slowly or fidgety/restless 0 1 1  Suicidal thoughts 0 0 0  PHQ-9 Score 8 8 15    Difficult doing work/chores Not difficult at all Somewhat difficult     BP Readings from Last 3 Encounters:  01/25/23 102/70  12/14/22 124/78  11/19/22 124/60    Physical Exam Vitals and nursing note reviewed.  Constitutional:      General: She is not in acute distress.    Appearance: Normal appearance. She is well-developed.  HENT:     Head: Normocephalic and atraumatic.  Cardiovascular:     Rate and Rhythm: Normal rate and regular rhythm.  Pulmonary:     Effort: Pulmonary effort is normal. No respiratory distress.     Breath sounds: No wheezing or rhonchi.  Abdominal:     Palpations: Abdomen is soft.  Musculoskeletal:        General: No swelling.     Cervical back: Normal range of motion.     Right lower leg: No edema.     Left lower leg: No edema.  Skin:    General: Skin is warm and dry.     Findings: No rash.  Neurological:     General: No focal deficit present.     Mental Status: She is alert and oriented to person, place, and time.     Gait: Gait abnormal (due to right knee pain).  Psychiatric:        Mood and Affect: Mood normal.        Behavior: Behavior normal.     Wt Readings from Last 3 Encounters:  01/25/23 158 lb (71.7 kg)  12/14/22 154 lb (69.9 kg)  11/19/22 156 lb (70.8 kg)    BP 102/70   Pulse (!) 102   Ht 5\' 1"  (1.549 m)   Wt 158 lb (71.7 kg)   SpO2 97%   BMI 29.85 kg/m   Assessment and Plan:  Problem List Items Addressed This Visit       Unprioritized   Type II diabetes mellitus with complication (HCC) - Primary (Chronic)    BS control improving.  Insulin restarted after hospitalization Seen by Endo last week - no A1C done.  Metformin increased to 4 per day. Medications continued. A1C today = 6.9 Will send letter Dr. Darlin Priestly location.      Relevant Orders   POCT glycosylated hemoglobin (Hb A1C) (Completed)   Restless legs syndrome   Relevant Medications   rOPINIRole (REQUIP) 1 MG tablet   Osteoarthritis of right  knee    She has been working hard to get her DM under control.  She is drinking more water and has lost some weight. Her A1C today is down from 9.0 to 6.9 Letter will be sent to Dr. Milbert Coulter of [facility] with this information.      Hyperlipidemia associated with type 2 diabetes mellitus (HCC)    LDL is  Lab Results  Component Value Date   LDLCALC 57 01/16/2023  Currently being treated with crestor with good compliance and no concerns.        No follow-ups on file.    Reubin Milan, MD [facility] [place]

## 2023-01-25 NOTE — Assessment & Plan Note (Signed)
LDL is  Lab Results  Component Value Date   LDLCALC 57 01/16/2023  Currently being treated with crestor with good compliance and no concerns.

## 2023-01-25 NOTE — Assessment & Plan Note (Addendum)
BS control improving.  Insulin restarted after hospitalization Seen by Endo last week - no A1C done.  Metformin increased to 4 per day. Medications continued. A1C today = 6.9 Will send letter Dr. Darlin Priestly location.

## 2023-01-28 ENCOUNTER — Other Ambulatory Visit: Payer: Self-pay | Admitting: Internal Medicine

## 2023-01-28 DIAGNOSIS — G43009 Migraine without aura, not intractable, without status migrainosus: Secondary | ICD-10-CM

## 2023-01-28 NOTE — Telephone Encounter (Signed)
Medication Refill - Medication: promethazine (PHENERGAN) 25 MG tablet   Has the patient contacted their pharmacy? No, pt  states was in the pharmacy, last week and forgot to ask about the refill. She know she can call the next time ( Preferred Pharmacy (with phone number or street name): [facility] [place] [phone]  [address] [place] [address]    Has the patient been seen for an appointment in the last year OR does the patient have an upcoming appointment? yes  Agent: Please be advised that RX refills may take up to 3 business days. We ask that you follow-up with your pharmacy.

## 2023-01-29 ENCOUNTER — Other Ambulatory Visit: Payer: Self-pay | Admitting: Internal Medicine

## 2023-01-29 DIAGNOSIS — J4540 Moderate persistent asthma, uncomplicated: Secondary | ICD-10-CM

## 2023-01-29 NOTE — Telephone Encounter (Signed)
Requested medication (s) are due for refill today: Yes  Requested medication (s) are on the active medication list: Yes  Last refill:  11/02/22  Future visit scheduled: No  Notes to clinic:  Unable to refill per protocol, cannot delegate.      Requested Prescriptions  Pending Prescriptions Disp Refills   promethazine (PHENERGAN) 25 MG tablet 180 tablet 0    Sig: Take 1 tablet (25 mg total) by mouth 2 (two) times daily as needed.     Not Delegated - Gastroenterology: Antiemetics Failed - 01/28/2023  9:36 AM      Failed - This refill cannot be delegated      Passed - Valid encounter within last 6 months    Recent Outpatient Visits           4 days ago Type II diabetes mellitus with complication [facility])   [place] [facility] at [facility], [name], MD   1 month ago Elevated liver function tests   [facility] at [facility], [name], MD   2 months ago Psychogenic nonepileptic seizure   [facility] at [facility], [name], MD   2 months ago Seizure disorder [facility])   [place] [facility] at [facility], [name], MD   3 months ago Type II diabetes mellitus with complication [facility])   [place] [facility] at [facility], [name], MD

## 2023-01-30 MED ORDER — PROMETHAZINE HCL 25 MG PO TABS
25.0000 mg | ORAL_TABLET | Freq: Two times a day (BID) | ORAL | 0 refills | Status: DC | PRN
Start: 2023-01-30 — End: 2023-05-07

## 2023-01-30 NOTE — Telephone Encounter (Signed)
Refill

## 2023-02-05 ENCOUNTER — Other Ambulatory Visit: Payer: Self-pay | Admitting: Internal Medicine

## 2023-02-05 DIAGNOSIS — M62830 Muscle spasm of back: Secondary | ICD-10-CM

## 2023-02-05 HISTORY — PX: TOTAL KNEE ARTHROPLASTY: SHX125

## 2023-02-11 DIAGNOSIS — M1711 Unilateral primary osteoarthritis, right knee: Secondary | ICD-10-CM | POA: Diagnosis not present

## 2023-02-11 DIAGNOSIS — M25561 Pain in right knee: Secondary | ICD-10-CM | POA: Diagnosis not present

## 2023-02-15 ENCOUNTER — Encounter: Payer: Self-pay | Admitting: Physician Assistant

## 2023-02-15 ENCOUNTER — Telehealth (INDEPENDENT_AMBULATORY_CARE_PROVIDER_SITE_OTHER): Payer: 59 | Admitting: Physician Assistant

## 2023-02-15 ENCOUNTER — Ambulatory Visit: Payer: Self-pay

## 2023-02-15 VITALS — Ht 61.0 in

## 2023-02-15 DIAGNOSIS — B370 Candidal stomatitis: Secondary | ICD-10-CM

## 2023-02-15 MED ORDER — FLUCONAZOLE 100 MG PO TABS
100.0000 mg | ORAL_TABLET | Freq: Every day | ORAL | 0 refills | Status: AC
Start: 2023-02-15 — End: 2023-02-22

## 2023-02-15 NOTE — Telephone Encounter (Signed)
  Chief Complaint: thrush Symptoms: white and red spots on tongue, painful, cracking on tongue Frequency: last week  Pertinent Negatives: NA Disposition: [] ED /[] Urgent Care (no appt availability in office) / [x] Appointment(In office/virtual)/ []  [place] [facility]/ [] Home Care/ [] Refused Recommended Disposition /[]  Mobile Bus/ []  Follow-up with PCP Additional Notes: pt states last week possible thrush started. She has been brushing teeth and doing warm salt water rinses but nothing helping. Pt states been on abx recently which is when she typically gets thrush. Scheduled VV today at 1100 with Reuel Boom, [place].   Summary: Possible thrush, white and red in mouth   The patient states for weeks she feels like she has had thrush mouth. She said her mouth is really white and red on her tongue and cheeks. It seems like it is getting worse with her tongue trying to crack and she is not sure what to do. She has been using warm salty water but that is not helping. There was an appt today but she said she couldn't make it because she has a handicap son she has to look after. Please assist patient further.       Reason for Disposition  [1] White patches that stick to tongue or inner cheek AND [2] can be wiped off  Answer Assessment - Initial Assessment Questions 1. SYMPTOM: "What's the main symptom you're concerned about?" (e.g., chapped lips, dry mouth, lump, sores)     White and red spots 2. ONSET: "When did the  sx  start?"     Last week  3. PAIN: "Is there any pain?" If Yes, ask: "How bad is it?" (Scale: 1-10; mild, moderate, severe)   - MILD (1-3):  doesn't interfere with eating or normal activities   - MODERATE (4-7): interferes with eating some solids and normal activities   - SEVERE (8-10):  excruciating pain, interferes with most normal activities   - SEVERE DYSPHAGIA: can't swallow liquids, drooling     Moderate  4. CAUSE: "What do you think is causing the symptoms?"     Possible  thrush 5. OTHER SYMPTOMS: "Do you have any other symptoms?" (e.g., fever, sore throat, toothache, swelling)     Areas cracking on tongue  Protocols used: Mouth Symptoms-A-AH

## 2023-02-15 NOTE — Progress Notes (Signed)
Date:  02/15/2023   Name:  Crystal Rich   DOB:  [birth year]   MRN:  [id number]   I connected with Crystal Rich on 02/15/23 via MyChart Video and verified that I am speaking with the correct person using appropriate identifiers. The limitations, risks, security and privacy concerns of performing an evaluation and management service by MyChart Video, including the higher likelihood of inaccurate diagnoses and treatments, and the availability of in person appointments were reviewed. The possible need of an additional face-to-face encounter for complete and high quality delivery of care was discussed. The patient was also made aware that there may be a patient responsible charge related to this service. The patient expressed understanding and wishes to proceed.   Provider location is in medical facility [facility] and Sports Medicine at [facility]). Patient location is at their home People involved in care of the patient during this telehealth encounter were myself, my CMA, and my front office/scheduling team member.    Chief Complaint: Thrush (X since 01/25/23, getting worse, using warm water and salt, cracks on tongue)  HPI Crystal Rich is a pleasant 57 year old female new to me today presenting via telehealth for evaluation of suspected thrush.  She was advised that for the suspected diagnosis, an in-person visit would be preferable, but transportation is an issue for her right now so she has opted for telehealth visit instead.  She reports progressive sore throat and oropharyngeal redness for the last 3 weeks, also noticing white patches on the tongue and in the mouth.  History of thrush in the past for which she has used fluconazole.  Currently using mometasone inhaler but states that she always rinses after use.  Also has well-controlled diabetes with last A1c 6.9%.   Medication list has been reviewed and updated.  Current Meds  Medication Sig   albuterol (VENTOLIN  HFA) 108 (90 Base) MCG/ACT inhaler Inhale 2 puffs into the lungs every 4 (four) hours as needed for wheezing or shortness of breath.   clonazePAM (KLONOPIN) 1 MG tablet 2 (two) times daily as needed.    COMFORT EZ PEN NEEDLES 31G X 5 MM MISC USE TWO PEN NEEDLES DAILY   Continuous Glucose Sensor (DEXCOM G7 SENSOR) MISC 1 each by Does not apply route daily.   cyclobenzaprine (FLEXERIL) 10 MG tablet TAKE 1 TABLET BY MOUTH 3 TIMES DAILY   escitalopram (LEXAPRO) 20 MG tablet Take 40 mg by mouth daily.   fluconazole (DIFLUCAN) 100 MG tablet Take 1 tablet (100 mg total) by mouth daily for 7 days. First day take two tablets (200 mg).   fluticasone (FLONASE) 50 MCG/ACT nasal spray Place 2 sprays into both nostrils daily.   hydrOXYzine (ATARAX/VISTARIL) 25 MG tablet Take 2 tablets by mouth every 8 (eight) hours as needed.    Insulin Glargine (BASAGLAR KWIKPEN) 100 UNIT/ML 30 units every AM and 15 units every PM   isosorbide dinitrate (ISORDIL) 10 MG tablet Take 10 mg by mouth 2 (two) times daily.   lidocaine (LIDODERM) 5 % 1 patch daily.   liraglutide (VICTOZA) 18 MG/3ML SOPN INJECT 1.8 mg INTO THE SKIN DAILY AT 6 PM   losartan (COZAAR) 50 MG tablet TAKE 1 TABLET BY MOUTH DAILY   metFORMIN (GLUCOPHAGE) 500 MG tablet TAKE 3 TABLETS BY MOUTH DAILY WITH FOOD (Patient taking differently: Take 1,000 mg by mouth 2 (two) times daily with a meal.)   mometasone (ASMANEX, 60 METERED DOSES,) 220 MCG/ACT inhaler Inhale 2 puffs into the lungs  daily.   montelukast (SINGULAIR) 10 MG tablet TAKE 1 TABLET BY MOUTH AT BEDTIME   pantoprazole (PROTONIX) 40 MG tablet TAKE 1 TABLET BY MOUTH TWICE DAILY   promethazine (PHENERGAN) 25 MG tablet Take 1 tablet (25 mg total) by mouth 2 (two) times daily as needed.   rizatriptan (MAXALT) 10 MG tablet TAKE 1 TABLET BY MOUTH AS NEEDED FOR MIGRAINE. MAY REPEAT IN 2 HOURS IF NEEDED   rOPINIRole (REQUIP) 1 MG tablet Take 5 tablets (5 mg total) by mouth daily. 2 mg in the AM and 3 mg in the  PM   rosuvastatin (CRESTOR) 5 MG tablet Take 1 tablet (5 mg total) by mouth 3 (three) times a week. Monday, Wednesday, Friday   Xylitol (XYLIMELTS) 500 MG DISK Take 500 mg by mouth daily as needed. For Dry Mouth     Review of Systems  Constitutional:  Negative for fever.  HENT:  Positive for sore throat.     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/02/2022   Hyperlipidemia associated with type 2 diabetes mellitus (HCC) 01/09/2022   Status post total hysterectomy and bilateral salpingo-oophorectomy 03/01/2020   Gastroesophageal reflux disease without esophagitis 12/24/2017   History of Clostridium difficile colitis 04/04/2017   Restless legs syndrome 12/31/2016   Essential hypertension 12/31/2016   Lymphadenopathy, axillary 04/25/2015   Type II diabetes mellitus with complication (HCC) 04/25/2015   Insomnia 03/11/2015   Chronic abdominal pain 11/17/2014   Esophageal spasm 11/17/2014   Anxiety, generalized 11/17/2014   Depression, major, recurrent, 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/2014   Migraine without aura and responsive to treatment 11/17/2014   Asthma, moderate persistent 11/17/2014    Allergies  Allergen Reactions   Macrobid [Nitrofurantoin] Other (See Comments)    "Patient could not walk"   Nsaids Shortness Of Breath   Doxycycline Itching   Januvia [Sitagliptin] Nausea And Vomiting   Sulfa Antibiotics Rash   Aspirin    Ketorolac     Other reaction(s): Other (See Comments) Patient states Burning sensation inside Other reaction(s): Other (See Comments) Patient states Burning sensation inside   Morphine Sulfate Nausea And Vomiting   Nalbuphine     Other reaction(s): Other (See Comments) Burning on inside   Penicillin V Other (See Comments)   Prochlorperazine Edisylate Other (See Comments)    tachycardia   Trazodone Cough   Bupropion Rash   Oxybutynin Rash     Immunization History  Administered Date(s) Administered   Influenza,inj,Quad PF,6+ Mos 01/19/2022   Influenza-Unspecified 04/15/2018   Pneumococcal Polysaccharide-23 08/17/2019   Tdap 03/15/2008, 09/19/2012    Past Surgical History:  Procedure Laterality Date   APPENDECTOMY     BILATERAL SALPINGOOPHORECTOMY  05/07/2008   CHOLECYSTECTOMY  05/08/2003   COLONOSCOPY  05/07/2014   TOTAL ABDOMINAL HYSTERECTOMY  1999    Social History   Tobacco Use   Smoking status: Never   Smokeless tobacco: Never  Substance Use Topics   Alcohol use: No    Alcohol/week: 0.0 standard drinks of alcohol   Drug use: No    Family History  Problem Relation Age of Onset   Diabetes Mother         02/15/2023   11:12 AM 01/25/2023    1:21 PM 12/14/2022    1:52 PM 11/19/2022    2:05 PM  GAD 7 : Generalized Anxiety Score  Nervous, Anxious, on Edge 1 1 0 1  Control/stop worrying 2 2  1 1  Worry too much - different things 2 2 1 1   Trouble relaxing 0 1 1 1   Restless 0 0 0 1  Easily annoyed or irritable 1 1 2 1   Afraid - awful might happen 0 0 0 1  Total GAD 7 Score 6 7 5 7   Anxiety Difficulty Somewhat difficult Somewhat difficult Somewhat difficult Somewhat difficult       02/15/2023   11:11 AM 01/25/2023    1:21 PM 12/14/2022    1:52 PM  Depression screen PHQ 2/9  Decreased Interest 0 0 1  Down, Depressed, Hopeless 1 1 1   PHQ - 2 Score 1 1 2   Altered sleeping 2 2 1   Tired, decreased energy 2 2 2   Change in appetite 0 0 1  Feeling bad or failure about yourself  2 2 0  Trouble concentrating 1 1 1   Moving slowly or fidgety/restless 0 0 1  Suicidal thoughts 0 0 0  PHQ-9 Score 8 8 8   Difficult doing work/chores Not difficult at all Not difficult at all Somewhat difficult    BP Readings from Last 3 Encounters:  01/25/23 102/70  12/14/22 124/78  11/19/22 124/60    Wt Readings from Last 3 Encounters:  01/25/23 158 lb (71.7 kg)  12/14/22 154 lb (69.9 kg)  11/19/22 156 lb (70.8 kg)     Ht 5\' 1"  (1.549 m)   BMI 29.85 kg/m   Physical Exam General: Speaking full sentences, no audible heavy breathing. Sounds alert and appropriately interactive. Well-appearing. Face symmetric. Extraocular movements intact. Pupils equal and round. No nasal flaring or accessory muscle use visualized.  Attempted to visualize the oropharynx via video call with the patient holding flashlight, but ultimately visibility was very poor due to lighting and patient familiarity with the technology.  Recent Labs     Component Value Date/Time   NA 139 12/14/2022 1433   K 4.1 12/14/2022 1433   CL 100 12/14/2022 1433   CO2 24 12/14/2022 1433   GLUCOSE 158 (H) 12/14/2022 1433   BUN 15 12/14/2022 1433   CREATININE 0.74 12/14/2022 1433   CALCIUM 9.4 12/14/2022 1433   PROT 7.0 12/14/2022 1433   ALBUMIN 4.2 12/14/2022 1433   AST 38 (A) 01/16/2023 0000   ALT 42 (A) 01/16/2023 0000   ALKPHOS 86 01/16/2023 0000   BILITOT <0.2 12/14/2022 1433   GFRNONAA 85 08/17/2019 0940   GFRAA 98 08/17/2019 0940    Lab Results  Component Value Date   WBC 7.7 07/20/2022   HGB 12.2 07/20/2022   HCT 37.6 07/20/2022   MCV 85 07/20/2022   PLT 252 07/20/2022   Lab Results  Component Value Date   HGBA1C 6.9 (A) 01/25/2023   Lab Results  Component Value Date   CHOL 153 01/16/2023   HDL 32 (A) 01/16/2023   LDLCALC 57 01/16/2023   TRIG 250 (A) 01/16/2023   CHOLHDL 4.7 (H) 07/20/2022   Lab Results  Component Value Date   TSH 3.06 01/16/2023     Assessment and Plan:  1. Oral candidiasis Treat presumptively for thrush as below.  Patient aware that she will require an in person visit if not better in 1 week. - fluconazole (DIFLUCAN) 100 MG tablet; Take 1 tablet (100 mg total) by mouth daily for 7 days. First day take two tablets (200 mg).  Dispense: 8 tablet; Refill: 0   F/u PRN  I discussed the above assessment and treatment plan with the patient. The patient was provided an  opportunity to ask  questions and all were answered. The patient agreed with the plan and demonstrated an understanding of the instructions. The patient was advised to call back or seek an in-person evaluation if the symptoms worsen or if the condition fails to improve as anticipated. I provided a total time of 12 minutes inclusive of time utilized for medical chart review, information gathering, care coordination with staff, and documentation completion.  Alvester Morin, PA-C, DMSc, Nutritionist [facility] Primary Care and Sports Medicine [facility] [phone]

## 2023-02-15 NOTE — Telephone Encounter (Signed)
Patient not available left message with family member to call the office back to speak to a triage nurse.  Summary: Possible thrush, white and red in mouth   The patient states for weeks she feels like she has had thrush mouth. She said her mouth is really white and red on her tongue and cheeks. It seems like it is getting worse with her tongue trying to crack and she is not sure what to do. She has been using warm salty water but that is not helping. There was an appt today but she said she couldn't make it because she has a handicap son she has to look after. Please assist patient further.

## 2023-02-19 DIAGNOSIS — Z01818 Encounter for other preprocedural examination: Secondary | ICD-10-CM | POA: Diagnosis not present

## 2023-02-19 DIAGNOSIS — M25561 Pain in right knee: Secondary | ICD-10-CM | POA: Diagnosis not present

## 2023-02-19 DIAGNOSIS — M1711 Unilateral primary osteoarthritis, right knee: Secondary | ICD-10-CM | POA: Diagnosis not present

## 2023-02-21 DIAGNOSIS — F445 Conversion disorder with seizures or convulsions: Secondary | ICD-10-CM | POA: Diagnosis not present

## 2023-02-28 DIAGNOSIS — M1711 Unilateral primary osteoarthritis, right knee: Secondary | ICD-10-CM | POA: Diagnosis not present

## 2023-02-28 DIAGNOSIS — K219 Gastro-esophageal reflux disease without esophagitis: Secondary | ICD-10-CM | POA: Diagnosis not present

## 2023-02-28 DIAGNOSIS — J45909 Unspecified asthma, uncomplicated: Secondary | ICD-10-CM | POA: Diagnosis not present

## 2023-02-28 DIAGNOSIS — E119 Type 2 diabetes mellitus without complications: Secondary | ICD-10-CM | POA: Diagnosis not present

## 2023-02-28 DIAGNOSIS — G8918 Other acute postprocedural pain: Secondary | ICD-10-CM | POA: Diagnosis not present

## 2023-02-28 DIAGNOSIS — I10 Essential (primary) hypertension: Secondary | ICD-10-CM | POA: Diagnosis not present

## 2023-03-06 DIAGNOSIS — Z96651 Presence of right artificial knee joint: Secondary | ICD-10-CM | POA: Diagnosis not present

## 2023-03-06 DIAGNOSIS — M25561 Pain in right knee: Secondary | ICD-10-CM | POA: Diagnosis not present

## 2023-03-06 DIAGNOSIS — M6281 Muscle weakness (generalized): Secondary | ICD-10-CM | POA: Diagnosis not present

## 2023-03-15 DIAGNOSIS — M6281 Muscle weakness (generalized): Secondary | ICD-10-CM | POA: Diagnosis not present

## 2023-03-15 DIAGNOSIS — M25561 Pain in right knee: Secondary | ICD-10-CM | POA: Diagnosis not present

## 2023-03-15 DIAGNOSIS — Z96651 Presence of right artificial knee joint: Secondary | ICD-10-CM | POA: Diagnosis not present

## 2023-03-26 ENCOUNTER — Other Ambulatory Visit: Payer: Self-pay | Admitting: Internal Medicine

## 2023-03-26 DIAGNOSIS — Z96651 Presence of right artificial knee joint: Secondary | ICD-10-CM | POA: Diagnosis not present

## 2023-03-26 DIAGNOSIS — K219 Gastro-esophageal reflux disease without esophagitis: Secondary | ICD-10-CM

## 2023-03-26 DIAGNOSIS — M6281 Muscle weakness (generalized): Secondary | ICD-10-CM | POA: Diagnosis not present

## 2023-03-26 DIAGNOSIS — M25561 Pain in right knee: Secondary | ICD-10-CM | POA: Diagnosis not present

## 2023-03-27 NOTE — Telephone Encounter (Signed)
Requested by interface surescripts. No future visit . Protocol met. Requested Prescriptions  Pending Prescriptions Disp Refills   pantoprazole (PROTONIX) 40 MG tablet [Pharmacy Med Name: PANTOPRAZOLE SODIUM 40 MG DR TAB] 180 tablet 0    Sig: TAKE 1 TABLET BY MOUTH TWICE DAILY     Gastroenterology: Proton Pump Inhibitors Passed - 03/26/2023 12:11 PM      Passed - Valid encounter within last 12 months    Recent Outpatient Visits           1 month ago Oral candidiasis   [place] Primary Care & [facility] Medicine at [facility], [place], [place]   2 months ago Type II diabetes mellitus with complication [facility])   [place] Primary Care & Sports Medicine at [facility], [name], [place]   3 months ago Elevated liver function tests   [facility] Primary Care & Sports Medicine at [facility], [name], [place]   4 months ago Psychogenic nonepileptic seizure   [facility] at [facility], [name], [place]   4 months ago Seizure disorder [facility])   [facility] Primary Care & Sports Medicine at [facility], [name], [place]

## 2023-03-28 DIAGNOSIS — M6281 Muscle weakness (generalized): Secondary | ICD-10-CM | POA: Diagnosis not present

## 2023-03-28 DIAGNOSIS — Z96651 Presence of right artificial knee joint: Secondary | ICD-10-CM | POA: Diagnosis not present

## 2023-03-28 DIAGNOSIS — M25561 Pain in right knee: Secondary | ICD-10-CM | POA: Diagnosis not present

## 2023-04-02 DIAGNOSIS — M6281 Muscle weakness (generalized): Secondary | ICD-10-CM | POA: Diagnosis not present

## 2023-04-02 DIAGNOSIS — Z96651 Presence of right artificial knee joint: Secondary | ICD-10-CM | POA: Diagnosis not present

## 2023-04-02 DIAGNOSIS — M25561 Pain in right knee: Secondary | ICD-10-CM | POA: Diagnosis not present

## 2023-04-06 ENCOUNTER — Other Ambulatory Visit: Payer: Self-pay | Admitting: Internal Medicine

## 2023-04-06 DIAGNOSIS — E118 Type 2 diabetes mellitus with unspecified complications: Secondary | ICD-10-CM

## 2023-04-08 ENCOUNTER — Other Ambulatory Visit: Payer: Self-pay | Admitting: Internal Medicine

## 2023-04-08 ENCOUNTER — Telehealth: Payer: Self-pay | Admitting: Internal Medicine

## 2023-04-08 DIAGNOSIS — F331 Major depressive disorder, recurrent, moderate: Secondary | ICD-10-CM | POA: Diagnosis not present

## 2023-04-08 DIAGNOSIS — G8929 Other chronic pain: Secondary | ICD-10-CM | POA: Diagnosis not present

## 2023-04-08 DIAGNOSIS — E118 Type 2 diabetes mellitus with unspecified complications: Secondary | ICD-10-CM

## 2023-04-08 DIAGNOSIS — F449 Dissociative and conversion disorder, unspecified: Secondary | ICD-10-CM | POA: Diagnosis not present

## 2023-04-08 DIAGNOSIS — F411 Generalized anxiety disorder: Secondary | ICD-10-CM | POA: Diagnosis not present

## 2023-04-08 DIAGNOSIS — G43009 Migraine without aura, not intractable, without status migrainosus: Secondary | ICD-10-CM

## 2023-04-08 DIAGNOSIS — G4700 Insomnia, unspecified: Secondary | ICD-10-CM | POA: Diagnosis not present

## 2023-04-08 DIAGNOSIS — Z79899 Other long term (current) drug therapy: Secondary | ICD-10-CM | POA: Diagnosis not present

## 2023-04-08 NOTE — Telephone Encounter (Signed)
Medication Refill -  Most Recent Primary Care Visit:  Provider: Remo Lipps  Department: [facility]  Visit Type: MYCHART VIDEO VISIT  Date: 02/15/2023  Medication: COMFORT EZ PEN NEEDLES 31G X 5 MM   Has the patient contacted their pharmacy? No  Is this the correct pharmacy for this prescription? Yes  This is the patient's preferred pharmacy: [facility] [place] - [address] [place] [address] Phone: [phone] Fax: [phone]  Has the prescription been filled recently? Yes  Is the patient out of the medication? Yes  Has the patient been seen for an appointment in the last year OR does the patient have an upcoming appointment? Yes  Can we respond through MyChart? No  Agent: Please be advised that Rx refills may take up to 3 business days. We ask that you follow-up with your pharmacy.

## 2023-04-08 NOTE — Telephone Encounter (Signed)
Requested Prescriptions  Pending Prescriptions Disp Refills   COMFORT EZ PEN NEEDLES 31G X 5 MM MISC [Pharmacy Med Name: COMFORT EZ PEN NEEDLES 31G X 5 MM] 100 each 3    Sig: USE TWO PEN NEEDLES DAILY     Endocrinology: Diabetes - Testing Supplies Passed - 04/06/2023  9:11 AM      Passed - Valid encounter within last 12 months    Recent Outpatient Visits           1 month ago Oral candidiasis   [place] Primary Care & Sports Medicine at [facility], [place], [place]   2 months ago Type II diabetes mellitus with complication [facility])   [place] Primary Care & Sports Medicine at [facility], [name], [place]   3 months ago Elevated liver function tests   [facility] Primary [facility] & [facility] at [facility], [name], [place]   4 months ago Psychogenic nonepileptic seizure   [facility] Primary Care & Sports Medicine at [facility], [name], [place]   5 months ago Seizure disorder [facility])   [facility] Primary Care & Sports Medicine at [facility], [name], MD

## 2023-04-08 NOTE — Telephone Encounter (Signed)
Requested on 04/06/23 by the pharmacy in a separate refill encounter.

## 2023-04-08 NOTE — Telephone Encounter (Signed)
Already requested today by pharmacy in a separate refill encounter.

## 2023-04-08 NOTE — Telephone Encounter (Signed)
Medication Refill -  Most Recent Primary Care Visit:  Provider: Remo Lipps  Department: [facility]  Visit Type: MYCHART VIDEO VISIT  Date: 02/15/2023  Medication: promethazine (PHENERGAN) 25 MG tablet   Has the patient contacted their pharmacy? Yes  Is this the correct pharmacy for this prescription? Yes If no, delete pharmacy and type the correct one.  This is the patient's preferred pharmacy:   [facility] [place] - [address] [place] [address] Phone: [phone] Fax: [phone]   Has the prescription been filled recently? Yes  Is the patient out of the medication? Yes  Has the patient been seen for an appointment in the last year OR does the patient have an upcoming appointment? Yes  Can we respond through MyChart? Yes  Agent: Please be advised that Rx refills may take up to 3 business days. We ask that you follow-up with your pharmacy.

## 2023-04-09 ENCOUNTER — Telehealth: Payer: Self-pay | Admitting: Internal Medicine

## 2023-04-09 ENCOUNTER — Other Ambulatory Visit: Payer: Self-pay | Admitting: Internal Medicine

## 2023-04-09 DIAGNOSIS — E118 Type 2 diabetes mellitus with unspecified complications: Secondary | ICD-10-CM

## 2023-04-09 MED ORDER — COMFORT EZ PEN NEEDLES 31G X 5 MM MISC: 3 refills | Status: AC

## 2023-04-09 NOTE — Telephone Encounter (Signed)
Copied from CRM [id number]. Topic: General - Other >> Apr 09, 2023  8:55 AM Runell Gess P wrote: Reason for CRM: for comfort pin needles she uses 3 per day not 2.  Victoza is 1 pin a day.Mariella Saa 2 times a day.  This is for clarification.  Per Maralyn Sago at [employer] in [place]

## 2023-04-09 NOTE — Telephone Encounter (Signed)
Requested medication (s) are due for refill today - no  Requested medication (s) are on the active medication list -yes  Future visit scheduled -no  Last refill: 01/30/23 #180  Notes to clinic: non delegated Rx,   To pharmacy: PATIENT HAD REDUCED DOSE RX POST SURGICAL, PLEASE RE-START THIS IF APPROPRIATE, THANKS!    Requested Prescriptions  Pending Prescriptions Disp Refills   promethazine (PHENERGAN) 25 MG tablet [Pharmacy Med Name: PROMETHAZINE HCL 25 MG TAB] 180 tablet 0    Sig: TAKE 1 TABLET BY MOUTH TWICE DAILY AS NEEDED     Not Delegated - Gastroenterology: Antiemetics Failed - 04/08/2023  8:51 AM      Failed - This refill cannot be delegated      Passed - Valid encounter within last 6 months    Recent Outpatient Visits           1 month ago Oral candidiasis   [place] Primary Care [facility] Sports Medicine at [facility], [place], [place]   2 months ago Type II diabetes mellitus with complication [facility])   [place] Primary Care & Sports Medicine at [facility], [name], [place]   3 months ago Elevated liver function tests   [facility] Primary [facility] at [facility], [name], [place]   4 months ago Psychogenic nonepileptic seizure   [facility] Primary Care & Sports Medicine at [facility], [name], [place]   5 months ago Seizure disorder [facility])   [place] Primary Care & Sports Medicine at [facility], [name], MD                 Requested Prescriptions  Pending Prescriptions Disp Refills   promethazine (PHENERGAN) 25 MG tablet [Pharmacy Med Name: PROMETHAZINE HCL 25 MG TAB] 180 tablet 0    Sig: TAKE 1 TABLET BY MOUTH TWICE DAILY AS NEEDED     Not Delegated - Gastroenterology: Antiemetics Failed - 04/08/2023  8:51 AM      Failed - This refill cannot be delegated      Passed - Valid encounter within last 6 months    Recent Outpatient Visits           1 month ago Oral candidiasis   [place]  Primary Care & Sports Medicine at [facility], [place], [place]   2 months ago Type II diabetes mellitus with complication [facility])   [place] Primary Care & Sports Medicine at [facility], [name], [place]   3 months ago Elevated liver function tests   [facility] at [facility], [name], [place]   4 months ago Psychogenic nonepileptic seizure   [facility] Primary Care & Sports Medicine at [facility], [name], [place]   5 months ago Seizure disorder [facility])   [facility] Primary Care & Sports Medicine at [facility], [name], MD

## 2023-04-09 NOTE — Telephone Encounter (Signed)
Please review.  KP

## 2023-04-12 DIAGNOSIS — Z96651 Presence of right artificial knee joint: Secondary | ICD-10-CM | POA: Diagnosis not present

## 2023-04-12 DIAGNOSIS — M6281 Muscle weakness (generalized): Secondary | ICD-10-CM | POA: Diagnosis not present

## 2023-04-12 DIAGNOSIS — M25561 Pain in right knee: Secondary | ICD-10-CM | POA: Diagnosis not present

## 2023-04-15 DIAGNOSIS — M25561 Pain in right knee: Secondary | ICD-10-CM | POA: Diagnosis not present

## 2023-04-15 DIAGNOSIS — M6281 Muscle weakness (generalized): Secondary | ICD-10-CM | POA: Diagnosis not present

## 2023-04-15 DIAGNOSIS — Z96651 Presence of right artificial knee joint: Secondary | ICD-10-CM | POA: Diagnosis not present

## 2023-04-17 ENCOUNTER — Other Ambulatory Visit: Payer: Self-pay | Admitting: Internal Medicine

## 2023-04-17 DIAGNOSIS — E118 Type 2 diabetes mellitus with unspecified complications: Secondary | ICD-10-CM

## 2023-04-18 DIAGNOSIS — Z7984 Long term (current) use of oral hypoglycemic drugs: Secondary | ICD-10-CM | POA: Diagnosis not present

## 2023-04-18 DIAGNOSIS — F32A Depression, unspecified: Secondary | ICD-10-CM | POA: Diagnosis not present

## 2023-04-18 DIAGNOSIS — Z96651 Presence of right artificial knee joint: Secondary | ICD-10-CM | POA: Diagnosis not present

## 2023-04-18 DIAGNOSIS — R32 Unspecified urinary incontinence: Secondary | ICD-10-CM | POA: Diagnosis not present

## 2023-04-18 DIAGNOSIS — Z9181 History of falling: Secondary | ICD-10-CM | POA: Diagnosis not present

## 2023-04-18 DIAGNOSIS — G43909 Migraine, unspecified, not intractable, without status migrainosus: Secondary | ICD-10-CM | POA: Diagnosis not present

## 2023-04-18 DIAGNOSIS — Z7985 Long-term (current) use of injectable non-insulin antidiabetic drugs: Secondary | ICD-10-CM | POA: Diagnosis not present

## 2023-04-18 DIAGNOSIS — Z471 Aftercare following joint replacement surgery: Secondary | ICD-10-CM | POA: Diagnosis not present

## 2023-04-18 DIAGNOSIS — Z602 Problems related to living alone: Secondary | ICD-10-CM | POA: Diagnosis not present

## 2023-04-18 DIAGNOSIS — Z794 Long term (current) use of insulin: Secondary | ICD-10-CM | POA: Diagnosis not present

## 2023-04-18 DIAGNOSIS — J4540 Moderate persistent asthma, uncomplicated: Secondary | ICD-10-CM | POA: Diagnosis not present

## 2023-04-18 DIAGNOSIS — F419 Anxiety disorder, unspecified: Secondary | ICD-10-CM | POA: Diagnosis not present

## 2023-04-18 DIAGNOSIS — I10 Essential (primary) hypertension: Secondary | ICD-10-CM | POA: Diagnosis not present

## 2023-04-18 DIAGNOSIS — K449 Diaphragmatic hernia without obstruction or gangrene: Secondary | ICD-10-CM | POA: Diagnosis not present

## 2023-04-18 DIAGNOSIS — E119 Type 2 diabetes mellitus without complications: Secondary | ICD-10-CM | POA: Diagnosis not present

## 2023-04-18 DIAGNOSIS — K219 Gastro-esophageal reflux disease without esophagitis: Secondary | ICD-10-CM | POA: Diagnosis not present

## 2023-04-18 DIAGNOSIS — G2581 Restless legs syndrome: Secondary | ICD-10-CM | POA: Diagnosis not present

## 2023-04-18 NOTE — Telephone Encounter (Signed)
Requested Prescriptions  Pending Prescriptions Disp Refills   Continuous Glucose Sensor (DEXCOM G7 SENSOR) MISC [Pharmacy Med Name: DEXCOM G7 SENSOR] 3 each 1    Sig: USE AS DIRECTED     Endocrinology: Diabetes - Testing Supplies Passed - 04/18/2023  9:04 AM      Passed - Valid encounter within last 12 months    Recent Outpatient Visits           2 months ago Oral candidiasis   [place] Primary Care & Sports Medicine at [facility], [place], [place]   2 months ago Type II diabetes mellitus with complication [facility])   [place] [facility] Care & Sports Medicine at [facility], [name], [place]   4 months ago Elevated liver function tests   [facility] Primary Care & Sports Medicine at [facility], [name], [place]   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   [facility] Primary Care & Sports Medicine at [facility], [name], MD   5 months ago Seizure disorder [facility])   [facility] Primary Care & Sports Medicine at [facility], [name], MD

## 2023-04-23 DIAGNOSIS — R2241 Localized swelling, mass and lump, right lower limb: Secondary | ICD-10-CM | POA: Diagnosis not present

## 2023-04-23 DIAGNOSIS — M79661 Pain in right lower leg: Secondary | ICD-10-CM | POA: Diagnosis not present

## 2023-04-23 DIAGNOSIS — G63 Polyneuropathy in diseases classified elsewhere: Secondary | ICD-10-CM | POA: Diagnosis not present

## 2023-04-26 DIAGNOSIS — Z7985 Long-term (current) use of injectable non-insulin antidiabetic drugs: Secondary | ICD-10-CM | POA: Diagnosis not present

## 2023-04-26 DIAGNOSIS — Z9181 History of falling: Secondary | ICD-10-CM | POA: Diagnosis not present

## 2023-04-26 DIAGNOSIS — G2581 Restless legs syndrome: Secondary | ICD-10-CM | POA: Diagnosis not present

## 2023-04-26 DIAGNOSIS — F419 Anxiety disorder, unspecified: Secondary | ICD-10-CM | POA: Diagnosis not present

## 2023-04-26 DIAGNOSIS — J4540 Moderate persistent asthma, uncomplicated: Secondary | ICD-10-CM | POA: Diagnosis not present

## 2023-04-26 DIAGNOSIS — F32A Depression, unspecified: Secondary | ICD-10-CM | POA: Diagnosis not present

## 2023-04-26 DIAGNOSIS — K449 Diaphragmatic hernia without obstruction or gangrene: Secondary | ICD-10-CM | POA: Diagnosis not present

## 2023-04-26 DIAGNOSIS — E119 Type 2 diabetes mellitus without complications: Secondary | ICD-10-CM | POA: Diagnosis not present

## 2023-04-26 DIAGNOSIS — Z794 Long term (current) use of insulin: Secondary | ICD-10-CM | POA: Diagnosis not present

## 2023-04-26 DIAGNOSIS — Z7984 Long term (current) use of oral hypoglycemic drugs: Secondary | ICD-10-CM | POA: Diagnosis not present

## 2023-04-26 DIAGNOSIS — Z96651 Presence of right artificial knee joint: Secondary | ICD-10-CM | POA: Diagnosis not present

## 2023-04-26 DIAGNOSIS — Z471 Aftercare following joint replacement surgery: Secondary | ICD-10-CM | POA: Diagnosis not present

## 2023-04-26 DIAGNOSIS — G43909 Migraine, unspecified, not intractable, without status migrainosus: Secondary | ICD-10-CM | POA: Diagnosis not present

## 2023-04-26 DIAGNOSIS — Z602 Problems related to living alone: Secondary | ICD-10-CM | POA: Diagnosis not present

## 2023-04-26 DIAGNOSIS — R32 Unspecified urinary incontinence: Secondary | ICD-10-CM | POA: Diagnosis not present

## 2023-04-26 DIAGNOSIS — I10 Essential (primary) hypertension: Secondary | ICD-10-CM | POA: Diagnosis not present

## 2023-04-26 DIAGNOSIS — K219 Gastro-esophageal reflux disease without esophagitis: Secondary | ICD-10-CM | POA: Diagnosis not present

## 2023-05-02 ENCOUNTER — Telehealth: Payer: Self-pay | Admitting: *Deleted

## 2023-05-02 ENCOUNTER — Telehealth: Payer: Self-pay | Admitting: Internal Medicine

## 2023-05-02 NOTE — Telephone Encounter (Signed)
Attempted to reach pt, left VM to call back. 

## 2023-05-02 NOTE — Telephone Encounter (Signed)
Pt is calling to request clairty on medication management. Pt picked up her medication. Sig states 1 tablet by mouth every day. Pt reports she should be take 2 tablets in the morning and 3 tablets at evening. Please advise CB- [phone]

## 2023-05-02 NOTE — Telephone Encounter (Signed)
   Chief Complaint: Medication Problem Symptoms: NA Frequency: NA Pertinent Negatives: Patient denies NA Disposition: [] ED /[] Urgent Care (no appt availability in office) / [] Appointment(In office/virtual)/ []  [place] [facility]/ [] Home Care/ [] Refused Recommended Disposition /[]  Mobile Bus/ []  Follow-up with PCP Additional Notes:   Pt is calling to request clairty on medication management. Pt picked up her medication. Sig states 1 tablet by mouth every day. Pt reports she should be take 2 tablets in the morning and 3 tablets at evening. Please advise CB- [phone]    Called pharmacy, spoke to Sarah. States Dr. Helen Hashimoto in [place] called in refill on 04/08/23 and placed on hold. Pt picked up today.  Med profile notes Dr. Karn Cassis last orders. Called pt back and made aware NT would route to practice for PCPs review.  Please advise.

## 2023-05-03 ENCOUNTER — Other Ambulatory Visit: Payer: Self-pay | Admitting: Internal Medicine

## 2023-05-03 DIAGNOSIS — G2581 Restless legs syndrome: Secondary | ICD-10-CM

## 2023-05-03 MED ORDER — ROPINIROLE HCL 1 MG PO TABS: 5.0000 mg | ORAL_TABLET | Freq: Every day | ORAL | 1 refills | Status: DC

## 2023-05-03 NOTE — Telephone Encounter (Signed)
Noted  KP 

## 2023-05-03 NOTE — Telephone Encounter (Signed)
Please review.  KP

## 2023-05-06 ENCOUNTER — Other Ambulatory Visit: Payer: Self-pay | Admitting: Internal Medicine

## 2023-05-06 DIAGNOSIS — G43009 Migraine without aura, not intractable, without status migrainosus: Secondary | ICD-10-CM

## 2023-05-06 DIAGNOSIS — G2581 Restless legs syndrome: Secondary | ICD-10-CM

## 2023-05-06 NOTE — Telephone Encounter (Signed)
Need clarification of pharmacy requested.

## 2023-05-06 NOTE — Telephone Encounter (Signed)
Medication Refill -  Most Recent Primary Care Visit:  Provider: Remo Lipps  Department: [facility]  Visit Type: MYCHART VIDEO VISIT  Date: 02/15/2023  Medication:  rOPINIRole (REQUIP) 1 MG tablet (the correct dosage was sent to the wrong pharmacy. Correct dosage: Take 5 tablets (5 mg total) by mouth daily. 2 mg in the AM and 3 mg in the PM )  promethazine (PHENERGAN) 25 MG tablet (out of refills) Pt states that the pharmacy gave her the wrong dosage.   rizatriptan (MAXALT) 10 MG tablet (out of medication)  Has the patient contacted their pharmacy? Yes  Is this the correct pharmacy for this prescription? Yes If no, delete pharmacy and type the correct one.  This is the patient's preferred pharmacy:  [id number] [place], [place] - [address]   [facility], [place] - [address] [place] [address] Phone: [phone] Fax: [phone]     Has the prescription been filled recently? Yes  Is the patient out of the medication? Yes  Has the patient been seen for an appointment in the last year OR does the patient have an upcoming appointment? Yes  Can we respond through MyChart? No  Agent: Please be advised that Rx refills may take up to 3 business days. We ask that you follow-up with your pharmacy.

## 2023-05-07 DIAGNOSIS — G8929 Other chronic pain: Secondary | ICD-10-CM | POA: Diagnosis not present

## 2023-05-07 DIAGNOSIS — F331 Major depressive disorder, recurrent, moderate: Secondary | ICD-10-CM | POA: Diagnosis not present

## 2023-05-07 DIAGNOSIS — F449 Dissociative and conversion disorder, unspecified: Secondary | ICD-10-CM | POA: Diagnosis not present

## 2023-05-07 DIAGNOSIS — Z79899 Other long term (current) drug therapy: Secondary | ICD-10-CM | POA: Diagnosis not present

## 2023-05-07 DIAGNOSIS — F411 Generalized anxiety disorder: Secondary | ICD-10-CM | POA: Diagnosis not present

## 2023-05-07 NOTE — Telephone Encounter (Signed)
 Pt called and stated needs refills of Max Alt and Phenergan and Ropinrole needs to go to [facility] [place]

## 2023-05-07 NOTE — Addendum Note (Signed)
 Addended by: Karma Lew on: 05/07/2023 08:48 AM   Modules accepted: Orders

## 2023-05-07 NOTE — Telephone Encounter (Signed)
Left message to call back in regard to prescriptions and correct pharmacy.

## 2023-05-09 ENCOUNTER — Telehealth: Payer: Self-pay

## 2023-05-09 MED ORDER — RIZATRIPTAN BENZOATE 10 MG PO TABS: 10.0000 mg | ORAL_TABLET | ORAL | 3 refills | Status: DC | PRN

## 2023-05-09 MED ORDER — ROPINIROLE HCL 1 MG PO TABS: 5.0000 mg | ORAL_TABLET | Freq: Every day | ORAL | 1 refills | Status: DC

## 2023-05-09 NOTE — Telephone Encounter (Signed)
 Requested Prescriptions  Pending Prescriptions Disp Refills   rOPINIRole  (REQUIP ) 1 MG tablet 450 tablet 1    Sig: Take 5 tablets (5 mg total) by mouth daily. 2 mg in the AM and 3 mg in the PM     Neurology:  Parkinsonian Agents Passed - 05/09/2023  9:49 AM      Passed - Last BP in normal range    BP Readings from Last 1 Encounters:  01/25/23 102/70         Passed - Last Heart Rate in normal range    Pulse Readings from Last 1 Encounters:  01/25/23 (!) 102         Passed - Valid encounter within last 12 months    Recent Outpatient Visits           2 months ago Oral candidiasis   [place] [facility] at [facility], Toribio SQUIBB, [place]   3 months ago Type II diabetes mellitus with complication [facility])   [place] [facility] Sports Medicine at [facility], Leita DEL, [place]   4 months ago Elevated liver function tests   [facility] Primary Care & Sports Medicine at [facility], Leita DEL, MD   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   [facility] Health Primary Care & Sports Medicine at [facility], Leita DEL, MD   6 months ago Seizure disorder [facility])   [facility] Health Primary Care & Sports Medicine at [facility], Leita DEL, MD               rizatriptan  (MAXALT ) 10 MG tablet 6 tablet 3    Sig: May repeat in 2 hours if needed     Neurology:  Migraine Therapy - Triptan Passed - 05/09/2023  9:49 AM      Passed - Last BP in normal range    BP Readings from Last 1 Encounters:  01/25/23 102/70         Passed - Valid encounter within last 12 months    Recent Outpatient Visits           2 months ago Oral candidiasis   [place] [facility] Medicine at [facility], Toribio SQUIBB, [place]   3 months ago Type II diabetes mellitus with complication [facility])   [place] [facility] Sports Medicine at 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     Not Delegated - Gastroenterology: Antiemetics Failed - 05/09/2023  9:49 AM      Failed - This refill cannot be delegated      Passed - Valid encounter within last 6 months    Recent Outpatient Visits           2 months ago Oral candidiasis    Primary Care & Sports Medicine  at [employer], Toribio [place], [place]   3 months ago Type II diabetes mellitus with complication [facility])   [place] [facility] at [facility], Leita DEL, MD   4 months ago Elevated liver function tests   [facility] Primary Care & Sports Medicine at [facility], Leita DEL, MD   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

## 2023-05-09 NOTE — Telephone Encounter (Signed)
 Copied from CRM [id number]. Topic: General - Other >> May 09, 2023  9:34 AM Shon Hale wrote: Reason for CRM: Pt calling to follow up on medication refill request from 05/06/2023.   Pt requesting call back

## 2023-05-09 NOTE — Telephone Encounter (Signed)
 Requested medication (s) are due for refill today: Yes  Requested medication (s) are on the active medication list: Yes  Last refill:  01/30/23  Future visit scheduled: No  Notes to clinic:  Unable to refill per protocol, cannot delegate.      Requested Prescriptions  Pending Prescriptions Disp Refills   promethazine  (PHENERGAN ) 25 MG tablet 180 tablet 0    Sig: Take 1 tablet (25 mg total) by mouth 2 (two) times daily as needed.     Not Delegated - Gastroenterology: Antiemetics Failed - 05/09/2023  9:50 AM      Failed - This refill cannot be delegated      Passed - Valid encounter within last 6 months    Recent Outpatient Visits           2 months ago Oral candidiasis   [place] [facility] Medicine at [facility], Toribio SQUIBB, [place]   3 months ago Type II diabetes mellitus with complication [facility])   [place] Primary Care & Sports Medicine at [facility], Leita DEL, [place]   4 months ago Elevated liver function tests   [facility] Health Primary Care & Sports Medicine at [facility], Leita DEL, MD   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 2 mg in the AM and 3 mg in the PM     Neurology:  Parkinsonian Agents Passed - 05/09/2023  9:50 AM      Passed - Last BP in normal range    BP Readings from Last 1 Encounters:  01/25/23 102/70         Passed - Last Heart Rate in normal range    Pulse Readings from Last 1 Encounters:  01/25/23 (!) 102         Passed - Valid encounter within last 12 months    Recent Outpatient Visits           2 months ago Oral candidiasis   [facility] Care [facility] Medicine at [facility], Toribio SQUIBB, [place]   3 months ago Type II diabetes mellitus with complication [facility])   [place] [facility] Sports Medicine at [facility], Leita DEL, MD   4 months ago Elevated liver function tests   [facility] Primary Care & Sports Medicine at [facility], Leita DEL, MD   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 May repeat in 2 hours if neededTAKE 1 TABLET BY MOUTH AS NEEDED FOR MIGRAINE. MAY REPEAT IN 2 HOURS IF NEEDED     Neurology:  Migraine  Therapy - Triptan Passed - 05/09/2023  9:50 AM      Passed - Last BP in normal range    BP Readings from Last 1 Encounters:  01/25/23 102/70         Passed - Valid encounter within last 12 months    Recent Outpatient Visits           2 months ago Oral candidiasis   [place] [facility] at [facility], Toribio SQUIBB, [place]   3 months ago Type II diabetes mellitus with complication [facility])   [place] Primary Care & Sports Medicine at [facility], Leita DEL, MD   4 months ago Elevated liver function tests   [facility] Health Primary Care & Sports Medicine at [facility], Leita DEL, MD   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

## 2023-05-09 NOTE — Telephone Encounter (Signed)
 Medications requested on 05/06/23 (ropinirole and rizaptriptan) refilled on 05/09/23, promethazine sent to the office for provider approval.

## 2023-05-09 NOTE — Telephone Encounter (Signed)
 Please review.  KP

## 2023-05-10 MED ORDER — PROMETHAZINE HCL 25 MG PO TABS: 25.0000 mg | ORAL_TABLET | Freq: Two times a day (BID) | ORAL | 0 refills | Status: DC | PRN

## 2023-05-10 NOTE — Telephone Encounter (Signed)
 Requested medications are due for refill today.  yes  Requested medications are on the active medications list.  yes  Last refill. 01/30/2023 #180 0 rf  Future visit scheduled.   no  Notes to clinic.  Refill not delegated.    Requested Prescriptions  Pending Prescriptions Disp Refills   promethazine  (PHENERGAN ) 25 MG tablet [Pharmacy Med Name: PROMETHAZINE  HCL 25 MG TAB] 180 tablet 0    Sig: TAKE 1 TABLET BY MOUTH TWICE DAILY AS NEEDED     Not Delegated - Gastroenterology: Antiemetics Failed - 05/10/2023  7:53 AM      Failed - This refill cannot be delegated      Passed - Valid encounter within last 6 months    Recent Outpatient Visits           2 months ago Oral candidiasis   [place] [facility] at [facility], Toribio SQUIBB, [place]   3 months ago Type II diabetes mellitus with complication [facility])   [place] [facility] Care & Sports Medicine at [facility], Leita DEL, [place]   4 months ago Elevated liver function tests   [facility] Health Primary Care & Sports Medicine at [facility], Leita DEL, MD   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 MAY REPEAT IN 2 HOURS IF NEEDED     Neurology:  Migraine Therapy - Triptan Passed - 05/10/2023  7:53 AM      Passed - Last BP in normal range    BP Readings from Last 1 Encounters:  01/25/23 102/70         Passed - Valid encounter within last 12 months    Recent Outpatient Visits           2 months ago Oral candidiasis   [place] [facility] Sports Medicine at [facility], Toribio SQUIBB, [place]   3 months ago Type II diabetes mellitus with complication [facility])   [place] [facility] Care & Sports Medicine at [facility], Leita DEL, [place]   4 months ago Elevated liver function tests   [facility] Primary Care & Sports Medicine at [facility], Leita DEL, MD   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   [facility] Health Primary Care & Sports Medicine at [facility], Leita DEL, MD   6 months ago Seizure disorder [facility])    Primary Care & Sports Medicine at [facility], Leita DEL, MD               rOPINIRole  (REQUIP ) 1 MG tablet [Pharmacy Med Name: ROPINIROLE  HCL 1 MG TAB] 450 tablet 1    Sig: TAKE 2 TABLETS BY MOUTH IN THE MORNING AND TAKE 3 TABLETS BY MOUTH IN THE EVENING     Neurology:  Parkinsonian Agents Passed - 05/10/2023  7:53 AM      Passed - Last BP in normal range    BP Readings from Last 1 Encounters:  01/25/23 102/70  Passed - Last Heart Rate in normal range    Pulse Readings from Last 1 Encounters:  01/25/23 (!) 102         Passed - Valid encounter within last 12 months    Recent Outpatient Visits           2 months ago Oral candidiasis   [place] [facility] Medicine at [facility], Toribio SQUIBB, [place]   3 months ago Type II diabetes mellitus with complication [facility])    Primary Care & Sports Medicine at [facility], Leita DEL, [place]   4 months ago Elevated liver function tests   [facility] Primary Care & Sports Medicine at [facility], Leita DEL, MD   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

## 2023-05-10 NOTE — Telephone Encounter (Signed)
 Requested Prescriptions  Pending Prescriptions Disp Refills   promethazine  (PHENERGAN ) 25 MG tablet [Pharmacy Med Name: PROMETHAZINE  HCL 25 MG TAB] 180 tablet 0    Sig: TAKE 1 TABLET BY MOUTH TWICE DAILY AS NEEDED     Not Delegated - Gastroenterology: Antiemetics Failed - 05/10/2023  7:53 AM      Failed - This refill cannot be delegated      Passed - Valid encounter within last 6 months    Recent Outpatient Visits           2 months ago Oral candidiasis   [place] [facility] Care [facility] Sports Medicine at [facility], Toribio SQUIBB, [place]   3 months ago Type II diabetes mellitus with complication [facility])   [place] [facility] Care & Sports Medicine at [facility], Leita DEL, [place]   4 months ago Elevated liver function tests   [facility] Health Primary Care & Sports Medicine at [facility], Leita DEL, MD   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)   [place] Primary Care & Sports Medicine at [facility], Leita DEL, MD              Refused Prescriptions Disp Refills   rizatriptan  (MAXALT ) 10 MG tablet [Pharmacy Med Name: RIZATRIPTAN  BENZOATE 10 MG TAB] 6 tablet 3    Sig: TAKE 1 TABLET BY MOUTH AS NEEDED FOR MIGRAINE. MAY REPEAT IN 2 HOURS IF NEEDED     Neurology:  Migraine Therapy - Triptan Passed - 05/10/2023  7:53 AM      Passed - Last BP in normal range    BP Readings from Last 1 Encounters:  01/25/23 102/70         Passed - Valid encounter within last 12 months    Recent Outpatient Visits           2 months ago Oral candidiasis   [place] [facility] Care & Sports Medicine at [facility], Toribio SQUIBB, [place]   3 months ago Type II diabetes mellitus with complication [facility])   [place] Primary Care & Sports Medicine at [facility], Leita DEL, [place]   4 months ago Elevated liver function tests   [facility]  Health Primary Care & Sports Medicine at [facility], Leita DEL, MD   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   [facility] Health Primary Care & Sports Medicine at [facility], Leita DEL, MD   6 months ago Seizure disorder [facility])    Primary Care & Sports Medicine at [facility], Leita DEL, MD               rOPINIRole  (REQUIP ) 1 MG tablet [Pharmacy Med Name: ROPINIROLE  HCL 1 MG TAB] 450 tablet 1    Sig: TAKE 2 TABLETS BY MOUTH IN THE MORNING AND TAKE 3 TABLETS BY MOUTH IN THE EVENING     Neurology:  Parkinsonian Agents Passed - 05/10/2023  7:53 AM      Passed - Last BP in normal range    BP Readings from Last 1 Encounters:  01/25/23 102/70         Passed - Last Heart Rate in normal range    Pulse Readings from Last 1 Encounters:  01/25/23 (!) 102         Passed - Valid encounter within last 12 months  Recent Outpatient Visits           2 months ago Oral candidiasis   [place] [facility] Care [facility] Sports Medicine at [facility], Toribio SQUIBB, [place]   3 months ago Type II diabetes mellitus with complication [facility])   [place] Primary Care & Sports Medicine at [facility], Leita DEL, [place]   4 months ago Elevated liver function tests   [facility] Health Primary Care & Sports Medicine at [facility], Leita DEL, [place]   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 Health Primary Care & Sports Medicine at [facility], Leita DEL, MD   6 months ago Seizure disorder [facility])   [facility] Health Primary Care & Sports Medicine at [facility], Leita DEL, [place]

## 2023-05-15 DIAGNOSIS — Z7985 Long-term (current) use of injectable non-insulin antidiabetic drugs: Secondary | ICD-10-CM | POA: Diagnosis not present

## 2023-05-15 DIAGNOSIS — Z96651 Presence of right artificial knee joint: Secondary | ICD-10-CM | POA: Diagnosis not present

## 2023-05-15 DIAGNOSIS — R32 Unspecified urinary incontinence: Secondary | ICD-10-CM | POA: Diagnosis not present

## 2023-05-15 DIAGNOSIS — I10 Essential (primary) hypertension: Secondary | ICD-10-CM | POA: Diagnosis not present

## 2023-05-15 DIAGNOSIS — F419 Anxiety disorder, unspecified: Secondary | ICD-10-CM | POA: Diagnosis not present

## 2023-05-15 DIAGNOSIS — F32A Depression, unspecified: Secondary | ICD-10-CM | POA: Diagnosis not present

## 2023-05-15 DIAGNOSIS — K449 Diaphragmatic hernia without obstruction or gangrene: Secondary | ICD-10-CM | POA: Diagnosis not present

## 2023-05-15 DIAGNOSIS — Z9181 History of falling: Secondary | ICD-10-CM | POA: Diagnosis not present

## 2023-05-15 DIAGNOSIS — G43909 Migraine, unspecified, not intractable, without status migrainosus: Secondary | ICD-10-CM | POA: Diagnosis not present

## 2023-05-15 DIAGNOSIS — Z602 Problems related to living alone: Secondary | ICD-10-CM | POA: Diagnosis not present

## 2023-05-15 DIAGNOSIS — Z471 Aftercare following joint replacement surgery: Secondary | ICD-10-CM | POA: Diagnosis not present

## 2023-05-15 DIAGNOSIS — Z794 Long term (current) use of insulin: Secondary | ICD-10-CM | POA: Diagnosis not present

## 2023-05-15 DIAGNOSIS — G2581 Restless legs syndrome: Secondary | ICD-10-CM | POA: Diagnosis not present

## 2023-05-15 DIAGNOSIS — K219 Gastro-esophageal reflux disease without esophagitis: Secondary | ICD-10-CM | POA: Diagnosis not present

## 2023-05-15 DIAGNOSIS — Z7984 Long term (current) use of oral hypoglycemic drugs: Secondary | ICD-10-CM | POA: Diagnosis not present

## 2023-05-15 DIAGNOSIS — E119 Type 2 diabetes mellitus without complications: Secondary | ICD-10-CM | POA: Diagnosis not present

## 2023-05-15 DIAGNOSIS — J4540 Moderate persistent asthma, uncomplicated: Secondary | ICD-10-CM | POA: Diagnosis not present

## 2023-05-17 DIAGNOSIS — F419 Anxiety disorder, unspecified: Secondary | ICD-10-CM | POA: Diagnosis not present

## 2023-05-17 DIAGNOSIS — G43909 Migraine, unspecified, not intractable, without status migrainosus: Secondary | ICD-10-CM | POA: Diagnosis not present

## 2023-05-17 DIAGNOSIS — Z471 Aftercare following joint replacement surgery: Secondary | ICD-10-CM | POA: Diagnosis not present

## 2023-05-17 DIAGNOSIS — Z794 Long term (current) use of insulin: Secondary | ICD-10-CM | POA: Diagnosis not present

## 2023-05-17 DIAGNOSIS — J4540 Moderate persistent asthma, uncomplicated: Secondary | ICD-10-CM | POA: Diagnosis not present

## 2023-05-17 DIAGNOSIS — Z7985 Long-term (current) use of injectable non-insulin antidiabetic drugs: Secondary | ICD-10-CM | POA: Diagnosis not present

## 2023-05-17 DIAGNOSIS — Z602 Problems related to living alone: Secondary | ICD-10-CM | POA: Diagnosis not present

## 2023-05-17 DIAGNOSIS — K449 Diaphragmatic hernia without obstruction or gangrene: Secondary | ICD-10-CM | POA: Diagnosis not present

## 2023-05-17 DIAGNOSIS — Z7984 Long term (current) use of oral hypoglycemic drugs: Secondary | ICD-10-CM | POA: Diagnosis not present

## 2023-05-17 DIAGNOSIS — E119 Type 2 diabetes mellitus without complications: Secondary | ICD-10-CM | POA: Diagnosis not present

## 2023-05-17 DIAGNOSIS — G2581 Restless legs syndrome: Secondary | ICD-10-CM | POA: Diagnosis not present

## 2023-05-17 DIAGNOSIS — K219 Gastro-esophageal reflux disease without esophagitis: Secondary | ICD-10-CM | POA: Diagnosis not present

## 2023-05-17 DIAGNOSIS — Z9181 History of falling: Secondary | ICD-10-CM | POA: Diagnosis not present

## 2023-05-17 DIAGNOSIS — R32 Unspecified urinary incontinence: Secondary | ICD-10-CM | POA: Diagnosis not present

## 2023-05-17 DIAGNOSIS — Z96651 Presence of right artificial knee joint: Secondary | ICD-10-CM | POA: Diagnosis not present

## 2023-05-17 DIAGNOSIS — F32A Depression, unspecified: Secondary | ICD-10-CM | POA: Diagnosis not present

## 2023-05-17 DIAGNOSIS — I10 Essential (primary) hypertension: Secondary | ICD-10-CM | POA: Diagnosis not present

## 2023-05-20 ENCOUNTER — Encounter: Payer: Self-pay | Admitting: Internal Medicine

## 2023-05-20 ENCOUNTER — Other Ambulatory Visit: Payer: Self-pay | Admitting: Internal Medicine

## 2023-05-20 DIAGNOSIS — E118 Type 2 diabetes mellitus with unspecified complications: Secondary | ICD-10-CM

## 2023-05-20 NOTE — Telephone Encounter (Signed)
 She needs to call her Endo and have Endo manage ALL of her diabetic medications from now on.

## 2023-05-20 NOTE — Telephone Encounter (Signed)
 The pt states the pharmacy told her they cannot get this medicine anymore.  Will need to change to a different med, or find this elsewhere. Please advise

## 2023-05-20 NOTE — Telephone Encounter (Signed)
 Medication Refill -  Most Recent Primary Care Visit:  Provider: MANYA TORIBIO SQUIBB  Department[facility]  Visit Type: MYCHART VIDEO VISIT  Date: 02/15/2023  Medication:  liraglutide  (VICTOZA ) 18 MG/3ML SOPN  Has the patient contacted their pharmacy? Yes The pt states the pharmacy told her they cannot get this medicine anymore.  Will need to change to a different med, or find this elsewhere. Please advise  Is this the correct pharmacy for this prescription? No If no, delete pharmacy and type the correct one.  This is the patient's preferred pharmacy:   [facility] [place] - [address] [place] [address] Phone: [phone] Fax: [phone]   Has the prescription been filled recently? No  filled 03/22/2023  Is the patient out of the medication? Yes  Has the patient been seen for an appointment in the last year OR does the patient have an upcoming appointment? Yes  Can we respond through MyChart? Yes  Agent: Please be advised that Rx refills may take up to 3 business days. We ask that you follow-up with your pharmacy.

## 2023-05-20 NOTE — Telephone Encounter (Signed)
 Please review.  KP

## 2023-05-21 NOTE — Telephone Encounter (Signed)
 Noted  KP

## 2023-05-27 ENCOUNTER — Other Ambulatory Visit: Payer: Self-pay | Admitting: Internal Medicine

## 2023-05-27 DIAGNOSIS — M62830 Muscle spasm of back: Secondary | ICD-10-CM

## 2023-05-27 NOTE — Telephone Encounter (Signed)
Requested medication (s) are due for refill today: yes  Requested medication (s) are on the active medication list: yes  Last refill:  02/05/23  Future visit scheduled: no  Notes to clinic:  Unable to refill per protocol, cannot delegate.      Requested Prescriptions  Pending Prescriptions Disp Refills   cyclobenzaprine (FLEXERIL) 10 MG tablet [Pharmacy Med Name: CYCLOBENZAPRINE HCL 10 MG TAB] 90 tablet 1    Sig: TAKE 1 TABLET BY MOUTH 3 TIMES DAILY     Not Delegated - Analgesics:  Muscle Relaxants Failed - 05/27/2023 11:09 AM      Failed - This refill cannot be delegated      Passed - Valid encounter within last 6 months    Recent Outpatient Visits           3 months ago Oral candidiasis   [place] Primary Care & Sports Medicine at [facility], Mel[place] Al[place], [place]   4 months ago Type II diabetes mellitus with complication [facility])   [place] Primary Care & Sports Medicine at [facility], [name], [place]   5 months ago Elevated liver function tests   [facility] Health Primary Care & Sports Medicine at [facility], [name], [place]   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 Health Primary Care & Sports Medicine at [facility], [name], [place]   6 months ago Seizure disorder [facility])   [facility] Health Primary Care & Sports Medicine at [facility], [name], [place]

## 2023-06-11 ENCOUNTER — Other Ambulatory Visit: Payer: Self-pay | Admitting: Internal Medicine

## 2023-06-11 DIAGNOSIS — E118 Type 2 diabetes mellitus with unspecified complications: Secondary | ICD-10-CM

## 2023-06-12 NOTE — Telephone Encounter (Signed)
 Requested Prescriptions  Pending Prescriptions Disp Refills   Continuous Glucose Sensor (DEXCOM G7 SENSOR) MISC [Pharmacy Med Name: DEXCOM G7 SENSOR] 3 each 1    Sig: USE AS DIRECTED     Endocrinology: Diabetes - Testing Supplies Passed - 06/12/2023  2:17 PM      Passed - Valid encounter within last 12 months    Recent Outpatient Visits           3 months ago Oral candidiasis   [place] Primary Care & Sports Medicine at [facility], [place], [place]   4 months ago Type II diabetes mellitus with complication [facility])   [place] Primary Care & Sports Medicine at [facility], Leita DEL, [place]   6 months ago Elevated liver function tests   [facility] Health Primary Care & Sports Medicine at [facility], Leita DEL, [place]   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

## 2023-06-19 DIAGNOSIS — R509 Fever, unspecified: Secondary | ICD-10-CM | POA: Diagnosis not present

## 2023-06-19 DIAGNOSIS — R0981 Nasal congestion: Secondary | ICD-10-CM | POA: Diagnosis not present

## 2023-06-21 ENCOUNTER — Telehealth: Payer: Self-pay | Admitting: Internal Medicine

## 2023-06-21 NOTE — Telephone Encounter (Signed)
Noted  KP

## 2023-06-21 NOTE — Telephone Encounter (Signed)
[facility] is calling in because they are going to fax over a Medical Clearance form over for pt because they are trying to have a dental procedure done. They will be faxing it over today.

## 2023-06-27 ENCOUNTER — Other Ambulatory Visit: Payer: Self-pay | Admitting: Internal Medicine

## 2023-06-27 DIAGNOSIS — M62830 Muscle spasm of back: Secondary | ICD-10-CM

## 2023-06-27 DIAGNOSIS — K219 Gastro-esophageal reflux disease without esophagitis: Secondary | ICD-10-CM

## 2023-06-28 NOTE — Telephone Encounter (Signed)
 Requested Prescriptions  Pending Prescriptions Disp Refills   cyclobenzaprine (FLEXERIL) 10 MG tablet [Pharmacy Med Name: CYCLOBENZAPRINE HCL 10 MG TAB] 90 tablet 0    Sig: TAKE 1 TABLET BY MOUTH 3 TIMES DAILY     Not Delegated - Analgesics:  Muscle Relaxants Failed - 06/28/2023 12:36 PM      Failed - This refill cannot be delegated      Passed - Valid encounter within last 6 months    Recent Outpatient Visits           4 months ago Oral candidiasis   [place] [facility] at [facility], Melton Alar, [place]   5 months ago Type II diabetes mellitus with complication [facility])   [place] [facility] at [facility], [name], [place]   6 months ago Elevated liver function tests   [facility] at [facility], [name], MD   7 months ago Psychogenic nonepileptic seizure   [facility] at [facility], [name], MD   7 months ago Seizure disorder [facility])   [place] [facility] at [facility], [name], MD              Refused Prescriptions Disp Refills   pantoprazole (PROTONIX) 40 MG tablet [Pharmacy Med Name: PANTOPRAZOLE SODIUM 40 MG DR TAB] 180 tablet 0    Sig: TAKE 1 TABLET BY MOUTH TWICE DAILY     Gastroenterology: Proton Pump Inhibitors Passed - 06/28/2023 12:36 PM      Passed - Valid encounter within last 12 months    Recent Outpatient Visits           4 months ago Oral candidiasis   [place] [facility] at [facility], Melton Alar, [place]   5 months ago Type II diabetes mellitus with complication [facility])   [place] [facility] at [facility], [name], MD   6 months ago Elevated liver function tests   [facility] at [facility], [name], MD   7 months ago Psychogenic nonepileptic seizure   [facility] at [facility], [name], MD   7 months ago Seizure disorder [facility])   [facility] at [facility], [name], MD

## 2023-06-28 NOTE — Telephone Encounter (Signed)
 Requested medications are due for refill today.  yes  Requested medications are on the active medications list.  yes  Last refill. 05/27/2023 #90 0 rf  Future visit scheduled.   no  Notes to clinic.  Refill not delegated.    Requested Prescriptions  Pending Prescriptions Disp Refills   cyclobenzaprine (FLEXERIL) 10 MG tablet [Pharmacy Med Name: CYCLOBENZAPRINE HCL 10 MG TAB] 90 tablet 0    Sig: TAKE 1 TABLET BY MOUTH 3 TIMES DAILY     Not Delegated - Analgesics:  Muscle Relaxants Failed - 06/28/2023 12:36 PM      Failed - This refill cannot be delegated      Passed - Valid encounter within last 6 months    Recent Outpatient Visits           4 months ago Oral candidiasis   [place] [facility] at [facility], Melton Alar, [place]   5 months ago Type II diabetes mellitus with complication [facility])   [place] [facility] at [facility], [name], MD   6 months ago Elevated liver function tests   [facility] at [facility], [name], MD   7 months ago Psychogenic nonepileptic seizure   [facility] at [facility], [name], MD   7 months ago Seizure disorder [facility])   [place] [facility] at [facility], [name], MD              Refused Prescriptions Disp Refills   pantoprazole (PROTONIX) 40 MG tablet [Pharmacy Med Name: PANTOPRAZOLE SODIUM 40 MG DR TAB] 180 tablet 0    Sig: TAKE 1 TABLET BY MOUTH TWICE DAILY     Gastroenterology: Proton Pump Inhibitors Passed - 06/28/2023 12:36 PM      Passed - Valid encounter within last 12 months    Recent Outpatient Visits           4 months ago Oral candidiasis   [place] [facility] at [facility], Melton Alar, [place]   5 months ago Type II diabetes mellitus with complication [facility])   [place] [facility]  at [facility], [name], MD   6 months ago Elevated liver function tests   [facility] at [facility], [name], MD   7 months ago Psychogenic nonepileptic seizure   [facility] Medicine at [facility], [name], MD   7 months ago Seizure disorder [facility])   [facility] at [facility], [name], MD

## 2023-06-30 DIAGNOSIS — N10 Acute tubulo-interstitial nephritis: Secondary | ICD-10-CM | POA: Diagnosis not present

## 2023-06-30 DIAGNOSIS — R1084 Generalized abdominal pain: Secondary | ICD-10-CM | POA: Diagnosis not present

## 2023-06-30 DIAGNOSIS — R1031 Right lower quadrant pain: Secondary | ICD-10-CM | POA: Diagnosis not present

## 2023-07-01 ENCOUNTER — Telehealth: Payer: Self-pay

## 2023-07-01 ENCOUNTER — Other Ambulatory Visit: Payer: Self-pay

## 2023-07-01 DIAGNOSIS — K219 Gastro-esophageal reflux disease without esophagitis: Secondary | ICD-10-CM

## 2023-07-01 MED ORDER — PANTOPRAZOLE SODIUM 40 MG PO TBEC: 40.0000 mg | DELAYED_RELEASE_TABLET | Freq: Two times a day (BID) | ORAL | 0 refills | Status: DC

## 2023-07-01 NOTE — Telephone Encounter (Signed)
 Refill sent.  Copied from CRM [id number]. Topic: Clinical - Prescription Issue >> Jun 28, 2023  4:42 PM Antony Haste wrote: PT is requesting for pantoprazole (PROTONIX) 40 MG tablet to be signed off to her pharmacy [facility] [place]. The pharmacy sent over a request yesterday. Advised that Rx refills may take up to 3 business days. We ask that you follow-up with your pharmacy.

## 2023-07-02 DIAGNOSIS — M79661 Pain in right lower leg: Secondary | ICD-10-CM | POA: Diagnosis not present

## 2023-07-10 DIAGNOSIS — R350 Frequency of micturition: Secondary | ICD-10-CM | POA: Diagnosis not present

## 2023-07-10 DIAGNOSIS — R030 Elevated blood-pressure reading, without diagnosis of hypertension: Secondary | ICD-10-CM | POA: Diagnosis not present

## 2023-07-10 DIAGNOSIS — J069 Acute upper respiratory infection, unspecified: Secondary | ICD-10-CM | POA: Diagnosis not present

## 2023-07-29 DIAGNOSIS — F411 Generalized anxiety disorder: Secondary | ICD-10-CM | POA: Diagnosis not present

## 2023-07-29 DIAGNOSIS — F339 Major depressive disorder, recurrent, unspecified: Secondary | ICD-10-CM | POA: Diagnosis not present

## 2023-07-29 DIAGNOSIS — F449 Dissociative and conversion disorder, unspecified: Secondary | ICD-10-CM | POA: Diagnosis not present

## 2023-08-13 ENCOUNTER — Other Ambulatory Visit: Payer: Self-pay | Admitting: Internal Medicine

## 2023-08-13 DIAGNOSIS — K219 Gastro-esophageal reflux disease without esophagitis: Secondary | ICD-10-CM

## 2023-08-13 NOTE — Telephone Encounter (Signed)
 Copied from CRM [id number]. Topic: Clinical - Medication Refill >> Aug 13, 2023  8:15 AM Gery Pray wrote: Most Recent Primary Care Visit:  Provider: Remo Lipps  [facility]  Visit Type: MYCHART VIDEO VISIT  Date: 02/15/2023  Medication: promethazine (PHENERGAN) 25 MG tablet  Has the patient contacted their pharmacy? Yes (Agent: If no, request that the patient contact the pharmacy for the refill. If patient does not wish to contact the pharmacy document the reason why and proceed with request.) (Agent: If yes, when and what did the pharmacy advise?) Pharmacy stated that they sent a request but have not had a response  Is this the correct pharmacy for this prescription? Yes If no, delete pharmacy and type the correct one.  This is the patient's preferred pharmacy:  [facility] [place] - [address] [place] [address] Phone: [phone] Fax: [phone]  Has the prescription been filled recently? No  Is the patient out of the medication? No 3 pills remaining  Has the patient been seen for an appointment in the last year OR does the patient have an upcoming appointment? Yes  Can we respond through MyChart? Yes  Agent: Please be advised that Rx refills may take up to 3 business days. We ask that you follow-up with your pharmacy.

## 2023-08-14 ENCOUNTER — Other Ambulatory Visit: Payer: Self-pay | Admitting: Internal Medicine

## 2023-08-14 DIAGNOSIS — G43009 Migraine without aura, not intractable, without status migrainosus: Secondary | ICD-10-CM

## 2023-08-15 NOTE — Telephone Encounter (Signed)
 Requested medications are due for refill today.  yes  Requested medications are on the active medications list.  yes  Last refill. 05/10/2023 #180 0 rf  Future visit scheduled.   no  Notes to clinic.  Refill not delegated.    Requested Prescriptions  Pending Prescriptions Disp Refills   promethazine (PHENERGAN) 25 MG tablet [Pharmacy Med Name: PROMETHAZINE HCL 25 MG TAB] 180 tablet 0    Sig: TAKE 1 TABLET BY MOUTH TWICE DAILY AS NEEDED     Not Delegated - Gastroenterology: Antiemetics Failed - 08/15/2023 10:46 AM      Failed - This refill cannot be delegated      Failed - Valid encounter within last 6 months    Recent Outpatient Visits   None

## 2023-08-19 DIAGNOSIS — J4489 Other specified chronic obstructive pulmonary disease: Secondary | ICD-10-CM | POA: Diagnosis not present

## 2023-08-19 DIAGNOSIS — R110 Nausea: Secondary | ICD-10-CM | POA: Diagnosis not present

## 2023-08-19 DIAGNOSIS — I10 Essential (primary) hypertension: Secondary | ICD-10-CM | POA: Diagnosis not present

## 2023-08-19 DIAGNOSIS — Z8719 Personal history of other diseases of the digestive system: Secondary | ICD-10-CM | POA: Diagnosis not present

## 2023-08-19 DIAGNOSIS — G43909 Migraine, unspecified, not intractable, without status migrainosus: Secondary | ICD-10-CM | POA: Diagnosis not present

## 2023-08-19 DIAGNOSIS — F325 Major depressive disorder, single episode, in full remission: Secondary | ICD-10-CM | POA: Diagnosis not present

## 2023-08-19 DIAGNOSIS — M1990 Unspecified osteoarthritis, unspecified site: Secondary | ICD-10-CM | POA: Diagnosis not present

## 2023-08-19 DIAGNOSIS — R32 Unspecified urinary incontinence: Secondary | ICD-10-CM | POA: Diagnosis not present

## 2023-08-19 DIAGNOSIS — K760 Fatty (change of) liver, not elsewhere classified: Secondary | ICD-10-CM | POA: Diagnosis not present

## 2023-08-19 DIAGNOSIS — Z880 Allergy status to penicillin: Secondary | ICD-10-CM | POA: Diagnosis not present

## 2023-08-19 DIAGNOSIS — Z9049 Acquired absence of other specified parts of digestive tract: Secondary | ICD-10-CM | POA: Diagnosis not present

## 2023-08-19 DIAGNOSIS — Z7985 Long-term (current) use of injectable non-insulin antidiabetic drugs: Secondary | ICD-10-CM | POA: Diagnosis not present

## 2023-08-19 DIAGNOSIS — Z87891 Personal history of nicotine dependence: Secondary | ICD-10-CM | POA: Diagnosis not present

## 2023-08-19 DIAGNOSIS — R1031 Right lower quadrant pain: Secondary | ICD-10-CM | POA: Diagnosis not present

## 2023-08-19 DIAGNOSIS — R109 Unspecified abdominal pain: Secondary | ICD-10-CM | POA: Diagnosis not present

## 2023-08-19 DIAGNOSIS — M545 Low back pain, unspecified: Secondary | ICD-10-CM | POA: Diagnosis not present

## 2023-08-19 DIAGNOSIS — G8929 Other chronic pain: Secondary | ICD-10-CM | POA: Diagnosis not present

## 2023-08-19 DIAGNOSIS — G2581 Restless legs syndrome: Secondary | ICD-10-CM | POA: Diagnosis not present

## 2023-08-19 DIAGNOSIS — K219 Gastro-esophageal reflux disease without esophagitis: Secondary | ICD-10-CM | POA: Diagnosis not present

## 2023-08-19 DIAGNOSIS — Z7984 Long term (current) use of oral hypoglycemic drugs: Secondary | ICD-10-CM | POA: Diagnosis not present

## 2023-08-19 LAB — BASIC METABOLIC PANEL WITH GFR
BUN: 10 (ref 4–21)
CO2: 24 — AB (ref 13–22)
Chloride: 101 (ref 99–108)
Creatinine: 0.8 (ref 0.5–1.1)
Glucose: 90
Potassium: 4.2 meq/L (ref 3.5–5.1)
Sodium: 137 (ref 137–147)

## 2023-08-19 LAB — COMPREHENSIVE METABOLIC PANEL WITH GFR
Calcium: 9.5 (ref 8.7–10.7)
eGFR: 86

## 2023-08-19 LAB — CBC AND DIFFERENTIAL
HCT: 39 (ref 36–46)
Hemoglobin: 12.6 (ref 12.0–16.0)
Platelets: 219 10*3/uL (ref 150–400)
WBC: 7.4

## 2023-08-20 ENCOUNTER — Telehealth: Payer: Self-pay | Admitting: Internal Medicine

## 2023-08-20 NOTE — Telephone Encounter (Signed)
 Copied from CRM [id number]. Topic: Referral - Request for Referral >> Aug 20, 2023  2:55 PM Crispin Dolphin wrote: Did the patient discuss referral with their provider in the last year? No - Had to go to [facility] last night due to pain - they say based on the results of CT Scan patient would need to have colonoscopy and she needs referral  (If No - schedule appointment) (If Yes - send message)  Appointment offered? No  Type of order/referral and detailed reason for visit: Colonoscopy based on ED visit from 4/14  Preference of office, provider, location: Dr Eden Goodpasture [phone]  If referral order, have you been seen by this specialty before? Yes - has not had this procedure with this provider but has seen him in the past for other things and had a colonoscopy over 5 years ago with a different provider.  (If Yes, this issue or another issue? When? Where?  Can we respond through MyChart? No

## 2023-08-20 NOTE — Telephone Encounter (Signed)
 Please review and advise.

## 2023-08-21 ENCOUNTER — Encounter: Payer: Self-pay | Admitting: Internal Medicine

## 2023-08-21 ENCOUNTER — Other Ambulatory Visit: Payer: Self-pay | Admitting: Internal Medicine

## 2023-08-21 DIAGNOSIS — R935 Abnormal findings on diagnostic imaging of other abdominal regions, including retroperitoneum: Secondary | ICD-10-CM

## 2023-08-21 NOTE — Progress Notes (Unsigned)
 Date:  08/21/2023   Name:  Crystal Rich   DOB:  [birth year]   MRN:  [id number]   Chief Complaint: No chief complaint on file.  HPI  Review of Systems   Lab Results  Component Value Date   NA 139 12/14/2022   K 4.1 12/14/2022   CO2 24 12/14/2022   GLUCOSE 158 (H) 12/14/2022   BUN 15 12/14/2022   CREATININE 0.74 12/14/2022   CALCIUM 9.4 12/14/2022   EGFR 95 12/14/2022   GFRNONAA 85 08/17/2019   Lab Results  Component Value Date   CHOL 153 01/16/2023   HDL 32 (A) 01/16/2023   LDLCALC 57 01/16/2023   TRIG 250 (A) 01/16/2023   CHOLHDL 4.7 (H) 07/20/2022   Lab Results  Component Value Date   TSH 3.06 01/16/2023   Lab Results  Component Value Date   HGBA1C 6.9 (A) 01/25/2023   Lab Results  Component Value Date   WBC 7.7 07/20/2022   HGB 12.2 07/20/2022   HCT 37.6 07/20/2022   MCV 85 07/20/2022   PLT 252 07/20/2022   Lab Results  Component Value Date   ALT 42 (A) 01/16/2023   AST 38 (A) 01/16/2023   ALKPHOS 86 01/16/2023   BILITOT <0.2 12/14/2022   No results found for: "25OHVITD2", "25OHVITD3", "VD25OH"   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/02/2022   Hyperlipidemia associated with type 2 diabetes mellitus (HCC) 01/09/2022   Status post total hysterectomy and bilateral salpingo-oophorectomy 03/01/2020   Gastroesophageal reflux disease without esophagitis 12/24/2017   History of Clostridium difficile colitis 04/04/2017   Restless legs syndrome 12/31/2016   Essential hypertension 12/31/2016   Lymphadenopathy, axillary 04/25/2015   Type II diabetes mellitus with complication (HCC) 04/25/2015   Insomnia 03/11/2015   Chronic abdominal pain 11/17/2014   Esophageal spasm 11/17/2014   Anxiety, generalized 11/17/2014   Depression, major, recurrent, 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/2014   Migraine without aura and responsive to treatment 11/17/2014   Asthma,  moderate persistent 11/17/2014    Allergies  Allergen Reactions   Macrobid [Nitrofurantoin] Other (See Comments)    "Patient could not walk"   Nsaids Shortness Of Breath   Doxycycline Itching   Januvia [Sitagliptin] Nausea And Vomiting   Sulfa Antibiotics Rash   Aspirin    Ketorolac     Other reaction(s): Other (See Comments) Patient states Burning sensation inside Other reaction(s): Other (See Comments) Patient states Burning sensation inside   Morphine Sulfate Nausea And Vomiting   Nalbuphine     Other reaction(s): Other (See Comments) Burning on inside   Penicillin V Other (See Comments)   Prochlorperazine Edisylate Other (See Comments)    tachycardia   Trazodone Cough   Bupropion Rash   Oxybutynin Rash    Past Surgical History:  Procedure Laterality Date   APPENDECTOMY     BILATERAL SALPINGOOPHORECTOMY  05/07/2008   CHOLECYSTECTOMY  05/08/2003   COLONOSCOPY  05/07/2014   TOTAL ABDOMINAL HYSTERECTOMY  1999    Social History   Tobacco Use   Smoking status: Never   Smokeless tobacco: Never  Substance Use Topics   Alcohol use: No    Alcohol/week: 0.0 standard drinks of alcohol   Drug use: No     Medication list has been reviewed and updated.  No outpatient medications have been marked as taking for the 08/21/23 encounter (Orders Only) with Reubin Milan, MD.  02/15/2023   11:12 AM 01/25/2023    1:21 PM 12/14/2022    1:52 PM 11/19/2022    2:05 PM  GAD 7 : Generalized Anxiety Score  Nervous, Anxious, on Edge 1 1 0 1  Control/stop worrying 2 2 1 1   Worry too much - different things 2 2 1 1   Trouble relaxing 0 1 1 1   Restless 0 0 0 1  Easily annoyed or irritable 1 1 2 1   Afraid - awful might happen 0 0 0 1  Total GAD 7 Score 6 7 5 7   Anxiety Difficulty Somewhat difficult Somewhat difficult Somewhat difficult Somewhat difficult       02/15/2023   11:11 AM 01/25/2023    1:21 PM 12/14/2022    1:52 PM  Depression screen PHQ 2/9  Decreased  Interest 0 0 1  Down, Depressed, Hopeless 1 1 1   PHQ - 2 Score 1 1 2   Altered sleeping 2 2 1   Tired, decreased energy 2 2 2   Change in appetite 0 0 1  Feeling bad or failure about yourself  2 2 0  Trouble concentrating 1 1 1   Moving slowly or fidgety/restless 0 0 1  Suicidal thoughts 0 0 0  PHQ-9 Score 8 8 8   Difficult doing work/chores Not difficult at all Not difficult at all Somewhat difficult    BP Readings from Last 3 Encounters:  01/25/23 102/70  12/14/22 124/78  11/19/22 124/60    Physical Exam  Wt Readings from Last 3 Encounters:  01/25/23 158 lb (71.7 kg)  12/14/22 154 lb (69.9 kg)  11/19/22 156 lb (70.8 kg)    There were no vitals taken for this visit.  Assessment and Plan:  Problem List Items Addressed This Visit   None   No follow-ups on file.    Sheron Dixons, MD [facility] [place]

## 2023-08-21 NOTE — Telephone Encounter (Signed)
 Please review.  KP

## 2023-08-22 ENCOUNTER — Telehealth: Payer: Self-pay | Admitting: Internal Medicine

## 2023-08-22 NOTE — Telephone Encounter (Signed)
 Copied from CRM [id number]. Topic: Referral - Question >> Aug 22, 2023  4:10 PM Star East wrote: Reason for CRM: [facility] [phone]- still waiting for referral with doctors notes from the CT

## 2023-08-23 NOTE — Telephone Encounter (Signed)
 Done.   Crystal Rich #: [phone]

## 2023-08-23 NOTE — Telephone Encounter (Signed)
 Spoke with [facility] regarding message, they stated that they do not have the results from the abnormal CT scan and would like to have those results/notes faxed over to their office before scheduling patient.

## 2023-08-27 DIAGNOSIS — F411 Generalized anxiety disorder: Secondary | ICD-10-CM | POA: Diagnosis not present

## 2023-08-27 DIAGNOSIS — F339 Major depressive disorder, recurrent, unspecified: Secondary | ICD-10-CM | POA: Diagnosis not present

## 2023-08-27 DIAGNOSIS — F410 Panic disorder [episodic paroxysmal anxiety] without agoraphobia: Secondary | ICD-10-CM | POA: Diagnosis not present

## 2023-09-03 ENCOUNTER — Other Ambulatory Visit: Payer: Self-pay | Admitting: Internal Medicine

## 2023-09-05 ENCOUNTER — Other Ambulatory Visit: Payer: Self-pay | Admitting: Internal Medicine

## 2023-09-05 NOTE — Telephone Encounter (Signed)
 Requested medication (s) are due for refill today: yes  Requested medication (s) are on the active medication list: yes  Last refill:  05/09/23  Future visit scheduled: yes  Notes to clinic:  Unable to refill per protocol, appointment needed, future OV scheduled     Requested Prescriptions  Pending Prescriptions Disp Refills   rizatriptan  (MAXALT ) 10 MG tablet [Pharmacy Med Name: RIZATRIPTAN  BENZOATE 10 MG TAB] 6 tablet 3    Sig: TAKE 1 TABLET BY MOUTH AS NEEDED FOR MIGRAINE. MAY REPEAT IN 2 HOURS IF NEEDED.     Neurology:  Migraine Therapy - Triptan Failed - 09/05/2023  1:59 PM      Failed - Valid encounter within last 12 months    Recent Outpatient Visits   None            Passed - Last BP in normal range    BP Readings from Last 1 Encounters:  01/25/23 102/70

## 2023-09-05 NOTE — Telephone Encounter (Signed)
 Med refill

## 2023-09-05 NOTE — Telephone Encounter (Unsigned)
 Copied from CRM [id number]. Topic: Clinical - Medication Refill >> Sep 05, 2023 10:41 AM Baldomero Bone wrote: Most Recent Primary Care Visit:  Provider: Leopoldo Rancher  Department: [facility]  Visit Type: MYCHART VIDEO VISIT  Date: 02/15/2023  Medication: rizatriptan  (MAXALT ) 10 MG tablet  Has the patient contacted their pharmacy? Yes (Agent: If no, request that the patient contact the pharmacy for the refill. If patient does not wish to contact the pharmacy document the reason why and proceed with request.) (Agent: If yes, when and what did the pharmacy advise?)  Is this the correct pharmacy for this prescription? Yes If no, delete pharmacy and type the correct one.  This is the patient's preferred pharmacy:  [facility], [place] - [address] [place] [address] Phone: [phone] Fax: [phone]   Has the prescription been filled recently? No  Is the patient out of the medication? No  Has the patient been seen for an appointment in the last year OR does the patient have an upcoming appointment? Yes  Can we respond through MyChart? No  Agent: Please be advised that Rx refills may take up to 3 business days. We ask that you follow-up with your pharmacy.

## 2023-09-09 NOTE — Telephone Encounter (Signed)
 Duplicate request, refilled 09/06/23.  Requested Prescriptions  Pending Prescriptions Disp Refills   rizatriptan  (MAXALT ) 10 MG tablet 6 tablet 3    Sig: Take 1 tablet (10 mg total) by mouth as needed for migraine. May repeat in 2 hours if neededTAKE 1 TABLET BY MOUTH AS NEEDED FOR MIGRAINE. MAY REPEAT IN 2 HOURS IF NEEDED     Neurology:  Migraine Therapy - Triptan Failed - 09/09/2023  8:07 AM      Failed - Valid encounter within last 12 months    Recent Outpatient Visits   None            Passed - Last BP in normal range    BP Readings from Last 1 Encounters:  01/25/23 102/70

## 2023-09-13 ENCOUNTER — Encounter: Payer: Self-pay | Admitting: Internal Medicine

## 2023-09-13 ENCOUNTER — Ambulatory Visit (INDEPENDENT_AMBULATORY_CARE_PROVIDER_SITE_OTHER): Payer: Self-pay | Admitting: Internal Medicine

## 2023-09-13 ENCOUNTER — Ambulatory Visit: Payer: Self-pay | Admitting: Internal Medicine

## 2023-09-13 VITALS — BP 114/70 | HR 104 | Ht 61.0 in | Wt 158.4 lb

## 2023-09-13 DIAGNOSIS — I10 Essential (primary) hypertension: Secondary | ICD-10-CM

## 2023-09-13 DIAGNOSIS — R935 Abnormal findings on diagnostic imaging of other abdominal regions, including retroperitoneum: Secondary | ICD-10-CM | POA: Insufficient documentation

## 2023-09-13 DIAGNOSIS — Z7985 Long-term (current) use of injectable non-insulin antidiabetic drugs: Secondary | ICD-10-CM

## 2023-09-13 DIAGNOSIS — E118 Type 2 diabetes mellitus with unspecified complications: Secondary | ICD-10-CM

## 2023-09-13 DIAGNOSIS — G2581 Restless legs syndrome: Secondary | ICD-10-CM | POA: Diagnosis not present

## 2023-09-13 DIAGNOSIS — R339 Retention of urine, unspecified: Secondary | ICD-10-CM | POA: Diagnosis not present

## 2023-09-13 LAB — POCT GLYCOSYLATED HEMOGLOBIN (HGB A1C): Hemoglobin A1C: 6.2 % — AB (ref 4.0–5.6)

## 2023-09-13 MED ORDER — PRAMIPEXOLE DIHYDROCHLORIDE 0.25 MG PO TABS: 0.2500 mg | ORAL_TABLET | Freq: Two times a day (BID) | ORAL | 0 refills | Status: AC

## 2023-09-13 NOTE — Assessment & Plan Note (Addendum)
 Recently done at [facility] - narrowing of the cecum was noted and concern for a mass was mentioned. Last colonoscopy was in 2016 She has GI appointment with her usual provider next month at [facility] Encouraged her to keep that appointment and follow through with recommendations for evaluation

## 2023-09-13 NOTE — Progress Notes (Signed)
 Date:  09/13/2023   Name:  Crystal Rich   DOB:  [birth year]   MRN:  [id number]   Chief Complaint: Hypertension and Diabetes  Diabetes She presents for her follow-up diabetic visit. She has type 2 diabetes mellitus. Her disease course has been stable. Pertinent negatives for hypoglycemia include no dizziness or headaches. Pertinent negatives for diabetes include no chest pain, no fatigue and no weakness. Current diabetic treatments: MTF, Insulin  and Victoza .  Hypertension Pertinent negatives include no chest pain, headaches, palpitations or shortness of breath.  Urinary Frequency  This is a recurrent problem. The problem occurs every urination. The quality of the pain is described as aching. The pain is mild. There has been no fever. Associated symptoms include frequency and hesitancy. Associated symptoms comments: Has to strain and still does not empty completely.  RLS - was doing fairly well on Requip  but now it is not effective. Tried gabapentin in the past with no benefit.  Cecal narrowing - seen on CT abdomen 08/2023.  She has a GI appointment next month with Dr. Leighton Punches. 1. Focal wall thickening of the distal cecum adjacent to the ileocecal  valve with focal luminal narrowing. Differential considerations include  focal inflammation or infection and a mass of the colon with associated  stricture versus focal contraction. Further evaluation with colonoscopy is  recommended to exclude a mass in this location.  2. No evidence of small bowel obstruction.  3. Multiple splenic granulomas and left lower lung granulomas, consistent  with prior granulomatous disease.  4. Fatty liver.    Review of Systems  Constitutional:  Negative for fatigue and unexpected weight change.  HENT:  Negative for trouble swallowing.   Eyes:  Negative for visual disturbance.  Respiratory:  Negative for cough, chest tightness, shortness of breath and wheezing.   Cardiovascular:  Negative for chest pain,  palpitations and leg swelling.  Gastrointestinal:  Negative for abdominal pain, constipation and diarrhea.  Genitourinary:  Positive for frequency and hesitancy.  Musculoskeletal:  Negative for arthralgias and myalgias.  Neurological:  Negative for dizziness, weakness, light-headedness and headaches.     Lab Results  Component Value Date   NA 137 08/19/2023   K 4.2 08/19/2023   CO2 24 (A) 08/19/2023   GLUCOSE 158 (H) 12/14/2022   BUN 10 08/19/2023   CREATININE 0.8 08/19/2023   CALCIUM  9.5 08/19/2023   EGFR 86 08/19/2023   GFRNONAA 85 08/17/2019   Lab Results  Component Value Date   CHOL 153 01/16/2023   HDL 32 (A) 01/16/2023   LDLCALC 57 01/16/2023   TRIG 250 (A) 01/16/2023   CHOLHDL 4.7 (H) 07/20/2022   Lab Results  Component Value Date   TSH 3.06 01/16/2023   Lab Results  Component Value Date   HGBA1C 6.2 (A) 09/13/2023   Lab Results  Component Value Date   WBC 7.4 08/19/2023   HGB 12.6 08/19/2023   HCT 39 08/19/2023   MCV 85 07/20/2022   PLT 219 08/19/2023   Lab Results  Component Value Date   ALT 42 (A) 01/16/2023   AST 38 (A) 01/16/2023   ALKPHOS 86 01/16/2023   BILITOT <0.2 12/14/2022   No results found for: "25OHVITD2", "25OHVITD3", "VD25OH"   Patient Active Problem List   Diagnosis Date Noted   Abnormal CT of the abdomen 09/13/2023   Osteoarthritis of right knee 01/25/2023   Psychogenic nonepileptic seizure 11/02/2022   Hyperlipidemia associated with type 2 diabetes mellitus (HCC) 01/09/2022   Status post total  hysterectomy and bilateral salpingo-oophorectomy 03/01/2020   Gastroesophageal reflux disease without esophagitis 12/24/2017   History of Clostridium difficile colitis 04/04/2017   Restless legs syndrome 12/31/2016   Essential hypertension 12/31/2016   Lymphadenopathy, axillary 04/25/2015   Type II diabetes mellitus with complication (HCC) 04/25/2015   Insomnia 03/11/2015   Chronic abdominal pain 11/17/2014   Esophageal spasm  11/17/2014   Anxiety, generalized 11/17/2014   Depression, major, recurrent, 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/2014   Migraine without aura and responsive to treatment 11/17/2014   Asthma, moderate persistent 11/17/2014    Allergies  Allergen Reactions   Macrobid  [Nitrofurantoin ] Other (See Comments)    "Patient could not walk"   Nsaids Shortness Of Breath   Doxycycline  Itching   Januvia  [Sitagliptin ] Nausea And Vomiting   Sulfa Antibiotics Rash   Aspirin    Ketorolac      Other reaction(s): Other (See Comments) Patient states Burning sensation inside Other reaction(s): Other (See Comments) Patient states Burning sensation inside   Morphine  Sulfate Nausea And Vomiting   Nalbuphine     Other reaction(s): Other (See Comments) Burning on inside   Penicillin V Other (See Comments)   Prochlorperazine Edisylate Other (See Comments)    tachycardia   Trazodone Cough   Bupropion Rash   Oxybutynin Rash    Past Surgical History:  Procedure Laterality Date   APPENDECTOMY     BILATERAL SALPINGOOPHORECTOMY  05/07/2008   CHOLECYSTECTOMY  05/08/2003   COLONOSCOPY  05/07/2014   TOTAL ABDOMINAL HYSTERECTOMY  1999   TOTAL KNEE ARTHROPLASTY Right 02/2023    Social History   Tobacco Use   Smoking status: Never   Smokeless tobacco: Never  Substance Use Topics   Alcohol use: No    Alcohol/week: 0.0 standard drinks of alcohol   Drug use: No     Medication list has been reviewed and updated.  Current Meds  Medication Sig   albuterol  (VENTOLIN  HFA) 108 (90 Base) MCG/ACT inhaler Inhale 2 puffs into the lungs every 4 (four) hours as needed for wheezing or shortness of breath.   clonazePAM (KLONOPIN) 1 MG tablet 2 (two) times daily as needed.    Continuous Glucose Sensor (DEXCOM G7 SENSOR) MISC USE AS DIRECTED   cyclobenzaprine  (FLEXERIL ) 10 MG tablet TAKE 1 TABLET BY MOUTH 3 TIMES DAILY   escitalopram (LEXAPRO) 20 MG tablet Take 40 mg by mouth daily.   fluticasone  (FLONASE ) 50  MCG/ACT nasal spray Place 2 sprays into both nostrils daily.   hydrOXYzine (ATARAX/VISTARIL) 25 MG tablet Take 2 tablets by mouth every 8 (eight) hours as needed.    Insulin  Glargine (BASAGLAR  KWIKPEN) 100 UNIT/ML 30 units every AM and 15 units every PM   Insulin  Pen Needle (COMFORT EZ PEN NEEDLES) 31G X 5 MM MISC USE THREE PEN NEEDLES DAILY   lidocaine (LIDODERM) 5 % 1 patch daily.   liraglutide  (VICTOZA ) 18 MG/3ML SOPN INJECT 1.8 mg INTO THE SKIN DAILY AT 6 PM   losartan  (COZAAR ) 50 MG tablet TAKE 1 TABLET BY MOUTH DAILY   metFORMIN  (GLUCOPHAGE ) 500 MG tablet TAKE 3 TABLETS BY MOUTH DAILY WITH FOOD (Patient taking differently: Take 1,000 mg by mouth 2 (two) times daily with a meal.)   mometasone (ASMANEX , 60 METERED DOSES,) 220 MCG/ACT inhaler Inhale 2 puffs into the lungs daily.   montelukast  (SINGULAIR ) 10 MG tablet TAKE 1 TABLET BY MOUTH AT BEDTIME   pantoprazole  (PROTONIX ) 40 MG tablet Take 1 tablet (40 mg total) by mouth 2 (two) times daily.   pramipexole  (MIRAPEX )  0.25 MG tablet Take 1 tablet (0.25 mg total) by mouth 2 (two) times daily.   promethazine  (PHENERGAN ) 25 MG tablet TAKE 1 TABLET BY MOUTH TWICE DAILY AS NEEDED   rizatriptan  (MAXALT ) 10 MG tablet TAKE 1 TABLET BY MOUTH AS NEEDED FOR MIGRAINE. MAY REPEAT IN 2 HOURS IF NEEDED.   rosuvastatin  (CRESTOR ) 5 MG tablet Take 1 tablet (5 mg total) by mouth 3 (three) times a week. Monday, Wednesday, Friday   Xylitol (XYLIMELTS) 500 MG DISK Take 500 mg by mouth daily as needed. For Dry Mouth   [DISCONTINUED] rOPINIRole  (REQUIP ) 1 MG tablet Take 5 tablets (5 mg total) by mouth daily. 2 mg in the AM and 3 mg in the PM       09/13/2023    3:44 PM 02/15/2023   11:12 AM 01/25/2023    1:21 PM 12/14/2022    1:52 PM  GAD 7 : Generalized Anxiety Score  Nervous, Anxious, on Edge 1 1 1  0  Control/stop worrying 1 2 2 1   Worry too much - different things 1 2 2 1   Trouble relaxing 0 0 1 1  Restless 0 0 0 0  Easily annoyed or irritable 2 1 1 2    Afraid - awful might happen 0 0 0 0  Total GAD 7 Score 5 6 7 5   Anxiety Difficulty Not difficult at all Somewhat difficult Somewhat difficult Somewhat difficult       09/13/2023    3:44 PM 02/15/2023   11:11 AM 01/25/2023    1:21 PM  Depression screen PHQ 2/9  Decreased Interest 1 0 0  Down, Depressed, Hopeless 0 1 1  PHQ - 2 Score 1 1 1   Altered sleeping 2 2 2   Tired, decreased energy 2 2 2   Change in appetite 1 0 0  Feeling bad or failure about yourself  0 2 2  Trouble concentrating 1 1 1   Moving slowly or fidgety/restless 0 0 0  Suicidal thoughts 0 0 0  PHQ-9 Score 7 8 8   Difficult doing work/chores Not difficult at all Not difficult at all Not difficult at all    BP Readings from Last 3 Encounters:  09/13/23 114/70  01/25/23 102/70  12/14/22 124/78    Physical Exam Vitals and nursing note reviewed.  Constitutional:      General: She is not in acute distress.    Appearance: She is well-developed.  HENT:     Head: Normocephalic and atraumatic.  Neck:     Vascular: No carotid bruit.  Cardiovascular:     Rate and Rhythm: Normal rate and regular rhythm.     Heart sounds: No murmur heard. Pulmonary:     Effort: Pulmonary effort is normal. No respiratory distress.     Breath sounds: No wheezing or rhonchi.  Musculoskeletal:     Cervical back: Normal range of motion.     Right lower leg: No edema.     Left lower leg: No edema.  Lymphadenopathy:     Cervical: No cervical adenopathy.  Skin:    General: Skin is warm and dry.     Findings: No rash.  Neurological:     Mental Status: She is alert and oriented to person, place, and time.  Psychiatric:        Mood and Affect: Mood normal.        Behavior: Behavior normal.     Wt Readings from Last 3 Encounters:  09/13/23 158 lb 6 oz (71.8 kg)  01/25/23 158 lb (  71.7 kg)  12/14/22 154 lb (69.9 kg)    BP 114/70   Pulse (!) 104   Ht 5\' 1"  (1.549 m)   Wt 158 lb 6 oz (71.8 kg)   SpO2 95%   BMI 29.92 kg/m    Assessment and Plan:  Problem List Items Addressed This Visit       Unprioritized   Type II diabetes mellitus with complication (HCC) (Chronic)   Blood sugars have been stable.  No recent hypoglycemic events requiring assistance. Currently medications are Victoza , MTF and Insulin . Lab Results  Component Value Date   HGBA1C 6.2 (A) 09/13/2023   Last visit no changes were made. Will continue same therapy.       Relevant Orders   POCT glycosylated hemoglobin (Hb A1C) (Completed)   Restless legs syndrome   Previously on gabapentin with no benefit Switched to Requip  and was doing fair but now worsening despite 3 tablet in the evening Will try Mirapex  0.5 mg - one a supper and one at bedtime Refer to Neurology for further management.      Relevant Medications   pramipexole  (MIRAPEX ) 0.25 MG tablet   Other Relevant Orders   Ambulatory referral to Neurology   Essential hypertension - Primary (Chronic)   Blood pressure is well controlled.  Current medications are losartan . Will continue same regimen along with efforts to limit dietary sodium.       Abnormal CT of the abdomen   Recently done at [facility] - narrowing of the cecum was noted and concern for a mass was mentioned. Last colonoscopy was in 2016 She has GI appointment with her usual provider next month at [facility] Encouraged her to keep that appointment and follow through with recommendations for evaluation      Other Visit Diagnoses       Incomplete emptying of bladder       Relevant Orders   Ambulatory referral to Urology     Long-term current use of injectable noninsulin antidiabetic medication           No follow-ups on file.    Sheron Dixons, MD [facility] Primary [facility] [place]

## 2023-09-13 NOTE — Assessment & Plan Note (Signed)
 Blood sugars have been stable.  No recent hypoglycemic events requiring assistance. Currently medications are Victoza , MTF and Insulin . Lab Results  Component Value Date   HGBA1C 6.2 (A) 09/13/2023   Last visit no changes were made. Will continue same therapy.

## 2023-09-13 NOTE — Assessment & Plan Note (Signed)
 Previously on gabapentin with no benefit Switched to Requip  and was doing fair but now worsening despite 3 tablet in the evening Will try Mirapex  0.5 mg - one a supper and one at bedtime Refer to Neurology for further management.

## 2023-09-13 NOTE — Assessment & Plan Note (Signed)
 Blood pressure is well controlled.  Current medications are losartan. Will continue same regimen along with efforts to limit dietary sodium.

## 2023-09-16 ENCOUNTER — Encounter: Payer: Self-pay | Admitting: Internal Medicine

## 2023-09-16 ENCOUNTER — Telehealth: Payer: Self-pay | Admitting: Internal Medicine

## 2023-09-16 NOTE — Telephone Encounter (Signed)
 Copied from CRM [id number]. Topic: Referral - Status >> Sep 16, 2023  9:01 AM Georgeann Kindred wrote: Reason for CRM: Patient called to check on a referral to the Neurologist. Please contact patient at [phone].

## 2023-09-16 NOTE — Telephone Encounter (Signed)
 Patient sent [facility] message in regards to referral.  JM

## 2023-09-16 NOTE — Telephone Encounter (Signed)
 LMOM for patient to call back.  JM

## 2023-09-18 ENCOUNTER — Other Ambulatory Visit: Payer: Self-pay | Admitting: Internal Medicine

## 2023-09-18 DIAGNOSIS — I10 Essential (primary) hypertension: Secondary | ICD-10-CM

## 2023-09-19 ENCOUNTER — Other Ambulatory Visit: Payer: Self-pay

## 2023-09-19 NOTE — Telephone Encounter (Signed)
 Requested Prescriptions  Refused Prescriptions Disp Refills   losartan  (COZAAR ) 50 MG tablet [Pharmacy Med Name: LOSARTAN  POTASSIUM 50 MG TAB] 90 tablet 1    Sig: TAKE 1 TABLET BY MOUTH DAILY     Cardiovascular:  Angiotensin Receptor Blockers Failed - 09/19/2023  3:50 PM      Failed - Valid encounter within last 6 months    Recent Outpatient Visits           6 days ago Essential hypertension   [place] [facility] Medicine at [facility], Chales [place], MD              Passed - Cr in normal range and within 180 days    Creatinine  Date Value Ref Range Status  08/19/2023 0.8 0.5 - 1.1 Final   Creatinine, Ser  Date Value Ref Range Status  12/14/2022 0.74 0.57 - 1.00 mg/dL Final         Passed - K in normal range and within 180 days    Potassium  Date Value Ref Range Status  08/19/2023 4.2 3.5 - 5.1 mEq/L Final         Passed - Patient is not pregnant      Passed - Last BP in normal range    BP Readings from Last 1 Encounters:  09/13/23 114/70

## 2023-09-27 ENCOUNTER — Other Ambulatory Visit: Payer: Self-pay | Admitting: Internal Medicine

## 2023-09-27 DIAGNOSIS — K219 Gastro-esophageal reflux disease without esophagitis: Secondary | ICD-10-CM

## 2023-10-01 ENCOUNTER — Other Ambulatory Visit: Payer: Self-pay | Admitting: Internal Medicine

## 2023-10-01 DIAGNOSIS — M62830 Muscle spasm of back: Secondary | ICD-10-CM

## 2023-10-01 NOTE — Telephone Encounter (Signed)
 Requested Prescriptions  Pending Prescriptions Disp Refills   pantoprazole  (PROTONIX ) 40 MG tablet [Pharmacy Med Name: PANTOPRAZOLE  SODIUM 40 MG DR TAB] 180 tablet 3    Sig: TAKE 1 TABLET BY MOUTH TWICE DAILY     Gastroenterology: Proton Pump Inhibitors Failed - 10/01/2023 11:55 AM      Failed - Valid encounter within last 12 months    Recent Outpatient Visits           2 weeks ago Essential hypertension   [place] [facility] Medicine at [facility], Chales [place], MD

## 2023-10-03 NOTE — Telephone Encounter (Signed)
 Requested medications are due for refill today.  yes  Requested medications are on the active medications list.  yes  Last refill. 06/28/2023 #90 0 rf  Future visit scheduled.   no  Notes to clinic.  Refill not delegated.    Requested Prescriptions  Pending Prescriptions Disp Refills   cyclobenzaprine  (FLEXERIL ) 10 MG tablet [Pharmacy Med Name: CYCLOBENZAPRINE  HCL 10 MG TAB] 90 tablet 0    Sig: TAKE 1 TABLET BY MOUTH 3 TIMES DAILY     Not Delegated - Analgesics:  Muscle Relaxants Failed - 10/03/2023  3:38 PM      Failed - This refill cannot be delegated      Failed - Valid encounter within last 6 months    Recent Outpatient Visits           2 weeks ago Essential hypertension   [place] Primary [facility] & [facility] Medicine at [facility], Chales [place], MD

## 2023-10-03 NOTE — Telephone Encounter (Signed)
Please review medication refill

## 2023-11-27 ENCOUNTER — Telehealth: Payer: Self-pay | Admitting: Pharmacy Technician

## 2023-11-27 ENCOUNTER — Other Ambulatory Visit (HOSPITAL_COMMUNITY): Payer: Self-pay

## 2023-11-27 ENCOUNTER — Telehealth: Payer: Self-pay

## 2023-11-27 ENCOUNTER — Other Ambulatory Visit: Payer: Self-pay | Admitting: Internal Medicine

## 2023-11-27 NOTE — Telephone Encounter (Signed)
 Pharmacy Patient Advocate Encounter   Received notification from Pt Calls Messages that prior authorization for Pantoprazole  Sodium 40MG  dr tablets is required/requested.   Insurance verification completed.   The patient is insured through [facility] .   Per test claim: PA required and submitted KEY/EOC/Request #: [id number] from 11/27/23 to 11/26/24. Ran test claim, Copay is $0.00. This test claim was processed through [facility]- copay amounts may vary at other pharmacies due to pharmacy/plan contracts, or as the patient moves through the different stages of their insurance plan.

## 2023-11-27 NOTE — Telephone Encounter (Signed)
 Per pharmacy, Patient needs PA on Pantoprazole  40 mg.  Thanks!

## 2023-11-27 NOTE — Telephone Encounter (Signed)
 Submitted and approved.

## 2023-11-28 ENCOUNTER — Other Ambulatory Visit (HOSPITAL_COMMUNITY): Payer: Self-pay

## 2023-11-28 ENCOUNTER — Other Ambulatory Visit: Payer: Self-pay

## 2023-11-28 NOTE — Telephone Encounter (Signed)
 Requested medications are due for refill today.  unsure  Requested medications are on the active medications list.  no  Last refill. never  Future visit scheduled.   no  Notes to clinic.  Please review.    Requested Prescriptions  Pending Prescriptions Disp Refills   rOPINIRole  (REQUIP ) 1 MG tablet [Pharmacy Med Name: ROPINIROLE  HCL 1 MG TABLET] 150 tablet 5    Sig: TAKE 2 TABLETS IN THE MORNING AND 3 TABLETS IN THE EVENINIG     Neurology:  Parkinsonian Agents Passed - 11/28/2023  3:37 PM      Passed - Last BP in normal range    BP Readings from Last 1 Encounters:  09/13/23 114/70         Passed - Last Heart Rate in normal range    Pulse Readings from Last 1 Encounters:  09/13/23 (!) 104         Passed - Valid encounter within last 12 months    Recent Outpatient Visits           2 months ago Essential hypertension   [facility] Primary Care & Sports Medicine at [facility], Leita DEL, MD

## 2023-12-04 ENCOUNTER — Other Ambulatory Visit: Payer: Self-pay | Admitting: Internal Medicine

## 2023-12-04 DIAGNOSIS — G43009 Migraine without aura, not intractable, without status migrainosus: Secondary | ICD-10-CM

## 2023-12-05 NOTE — Telephone Encounter (Signed)
 Requested medications are due for refill today.  yes  Requested medications are on the active medications list.  yes  Last refill. 08/15/2023 #180 0 rf  Future visit scheduled.   no  Notes to clinic.  Refill not delegated.    Requested Prescriptions  Pending Prescriptions Disp Refills   promethazine  (PHENERGAN ) 25 MG tablet [Pharmacy Med Name: PROMETHAZINE  HCL 25 MG TAB] 180 tablet 0    Sig: TAKE 1 TABLET BY MOUTH TWICE DAILY AS NEEDED     Not Delegated - Gastroenterology: Antiemetics Failed - 12/05/2023  8:12 AM      Failed - This refill cannot be delegated      Passed - Valid encounter within last 6 months    Recent Outpatient Visits           2 months ago Essential hypertension   [place] Primary Care & Sports Medicine at [facility], Leita DEL, MD

## 2023-12-17 ENCOUNTER — Telehealth: Payer: Self-pay

## 2023-12-17 NOTE — Transitions of Care (Post Inpatient/ED Visit) (Signed)
   12/17/2023  Name: Crystal Rich MRN: [id number] DOB: [birth year]  Today's TOC FU Call Status: Today's TOC FU Call Status:: Unsuccessful Call (1st Attempt) Unsuccessful Call (1st Attempt) Date: 12/17/23  Attempted to reach the patient regarding the most recent Inpatient/ED visit.  Follow Up Plan: Additional outreach attempts will be made to reach the patient to complete the Transitions of Care (Post Inpatient/ED visit) call.   Elnathan Fulford Mancelona  Primary Care & Sports Medicine at [facility] [place] CMA, AAMA [address]  [place] [address] Office [phone]  Fax: [phone]

## 2023-12-23 ENCOUNTER — Other Ambulatory Visit: Payer: Self-pay | Admitting: Internal Medicine

## 2023-12-23 DIAGNOSIS — I10 Essential (primary) hypertension: Secondary | ICD-10-CM

## 2023-12-25 NOTE — Transitions of Care (Post Inpatient/ED Visit) (Signed)
   12/25/2023  Name: Crystal Rich MRN: [id number] DOB: [birth year]  Today's TOC FU Call Status: Today's TOC FU Call Status:: Unsuccessful Call (2nd Attempt) Unsuccessful Call (1st Attempt) Date: 12/17/23 Unsuccessful Call (2nd Attempt) Date: 12/25/23  Attempted to reach the patient regarding the most recent Inpatient/ED visit.  Follow Up Plan: Additional outreach attempts will be made to reach the patient to complete the Transitions of Care (Post Inpatient/ED visit) call.   Rufino [facility]up [facility]  Primary Care & Sports Medicine [facility] CMA, AAMA [address]  [place] [address] Office [phone]  Fax: [phone]

## 2023-12-25 NOTE — Telephone Encounter (Signed)
 Requested Prescriptions  Pending Prescriptions Disp Refills   losartan  (COZAAR ) 50 MG tablet [Pharmacy Med Name: LOSARTAN  POTASSIUM 50 MG TAB] 90 tablet 0    Sig: TAKE 1 TABLET BY MOUTH DAILY     Cardiovascular:  Angiotensin Receptor Blockers Passed - 12/25/2023 10:29 AM      Passed - Cr in normal range and within 180 days    Creatinine  Date Value Ref Range Status  08/19/2023 0.8 0.5 - 1.1 Final   Creatinine, Ser  Date Value Ref Range Status  12/14/2022 0.74 0.57 - 1.00 mg/dL Final         Passed - K in normal range and within 180 days    Potassium  Date Value Ref Range Status  08/19/2023 4.2 3.5 - 5.1 mEq/L Final         Passed - Patient is not pregnant      Passed - Last BP in normal range    BP Readings from Last 1 Encounters:  09/13/23 114/70         Passed - Valid encounter within last 6 months    Recent Outpatient Visits           3 months ago Essential hypertension   [place] Primary Care & Sports Medicine at [facility], Leita DEL, MD

## 2023-12-31 ENCOUNTER — Other Ambulatory Visit: Payer: Self-pay | Admitting: Internal Medicine

## 2023-12-31 DIAGNOSIS — J4540 Moderate persistent asthma, uncomplicated: Secondary | ICD-10-CM

## 2024-01-01 NOTE — Telephone Encounter (Signed)
 Requested Prescriptions  Pending Prescriptions Disp Refills   albuterol  (VENTOLIN  HFA) 108 (90 Base) MCG/ACT inhaler [Pharmacy Med Name: ALBUTEROL  SULFATE HFA 108 (90 BASE)] 18 g 5    Sig: INHALE 2 PUFFS INTO THE LUNGS EVERY 4 HOURS AS NEEDED FOR WHEEZING OR SHORTNESS OF BREATH     Pulmonology:  Beta Agonists 2 Passed - 01/01/2024  3:09 PM      Passed - Last BP in normal range    BP Readings from Last 1 Encounters:  09/13/23 114/70         Passed - Last Heart Rate in normal range    Pulse Readings from Last 1 Encounters:  09/13/23 (!) 104         Passed - Valid encounter within last 12 months    Recent Outpatient Visits           3 months ago Essential hypertension    Primary Care & Sports Medicine at [facility], Leita DEL, MD               rizatriptan  (MAXALT ) 10 MG tablet [Pharmacy Med Name: RIZATRIPTAN  BENZOATE 10 MG TAB] 6 tablet 3    Sig: TAKE 1 TABLET BY MOUTH AS NEEDED FOR MIGRAINE. MAY REPEAT IN 2 HOURS IF NEEDED.     Neurology:  Migraine Therapy - Triptan Passed - 01/01/2024  3:09 PM      Passed - Last BP in normal range    BP Readings from Last 1 Encounters:  09/13/23 114/70         Passed - Valid encounter within last 12 months    Recent Outpatient Visits           3 months ago Essential hypertension   [facility] Primary Care & Sports Medicine at [facility], Leita DEL, MD

## 2024-02-13 ENCOUNTER — Other Ambulatory Visit: Payer: Self-pay | Admitting: Internal Medicine

## 2024-02-13 DIAGNOSIS — J4540 Moderate persistent asthma, uncomplicated: Secondary | ICD-10-CM

## 2024-02-14 NOTE — Telephone Encounter (Signed)
 Requested Prescriptions  Pending Prescriptions Disp Refills   montelukast  (SINGULAIR ) 10 MG tablet [Pharmacy Med Name: MONTELUKAST  SODIUM 10 MG TAB] 90 tablet 1    Sig: TAKE 1 TABLET BY MOUTH AT BEDTIME     Pulmonology:  Leukotriene Inhibitors Passed - 02/14/2024  4:29 PM      Passed - Valid encounter within last 12 months    Recent Outpatient Visits           5 months ago Essential hypertension   [place] [facility] Care & Sports Medicine at [facility], Leita DEL, MD

## 2024-04-13 ENCOUNTER — Other Ambulatory Visit: Payer: Self-pay | Admitting: Internal Medicine

## 2024-04-13 DIAGNOSIS — G43009 Migraine without aura, not intractable, without status migrainosus: Secondary | ICD-10-CM

## 2024-04-15 NOTE — Telephone Encounter (Signed)
 Requested medication (s) are due for refill today: yes  Requested medication (s) are on the active medication list: yes  Last refill:  12/05/23  Future visit scheduled: no  Notes to clinic:  Unable to refill per protocol, cannot delegate.        Requested Prescriptions  Pending Prescriptions Disp Refills   promethazine  (PHENERGAN ) 25 MG tablet [Pharmacy Med Name: PROMETHAZINE  HCL 25 MG TAB] 180 tablet 0    Sig: TAKE 1 TABLET BY MOUTH TWICE DAILY AS NEEDED     Not Delegated - Gastroenterology: Antiemetics Failed - 04/15/2024 11:40 AM      Failed - This refill cannot be delegated      Failed - Valid encounter within last 6 months    Recent Outpatient Visits           7 months ago Essential hypertension    Primary Care & Sports Medicine at [facility], Leita DEL, MD

## 2024-04-21 ENCOUNTER — Other Ambulatory Visit: Payer: Self-pay | Admitting: Internal Medicine

## 2024-04-21 DIAGNOSIS — E118 Type 2 diabetes mellitus with unspecified complications: Secondary | ICD-10-CM

## 2024-04-24 NOTE — Telephone Encounter (Signed)
 Requested Prescriptions  Pending Prescriptions Disp Refills   Insulin  Pen Needle (COMFORT EZ PEN NEEDLES) 31G X 5 MM MISC [Pharmacy Med Name: COMFORT EZ PEN NEEDLES 31G X 5 MM] 300 each 0    Sig: USE 3 PEN NEEDLES DAILY AS DIRECTED     Endocrinology: Diabetes - Testing Supplies Passed - 04/24/2024 10:15 AM      Passed - Valid encounter within last 12 months    Recent Outpatient Visits           7 months ago Essential hypertension   [place] Primary Care & Sports Medicine at [facility], Leita DEL, MD
